# Patient Record
Sex: Female | Born: 1970 | Race: White | Hispanic: No | Smoking: Never smoker
Health system: Southern US, Community
[De-identification: ages and names within clinical notes are randomized; demographics above are authoritative.]

## PROBLEM LIST (undated history)

## (undated) DIAGNOSIS — K259 Gastric ulcer, unspecified as acute or chronic, without hemorrhage or perforation: Secondary | ICD-10-CM

## (undated) DIAGNOSIS — J852 Abscess of lung without pneumonia: Secondary | ICD-10-CM

## (undated) DIAGNOSIS — Z9189 Other specified personal risk factors, not elsewhere classified: Secondary | ICD-10-CM

## (undated) DIAGNOSIS — D509 Iron deficiency anemia, unspecified: Secondary | ICD-10-CM

## (undated) DIAGNOSIS — K912 Postsurgical malabsorption, not elsewhere classified: Secondary | ICD-10-CM

## (undated) DIAGNOSIS — J851 Abscess of lung with pneumonia: Secondary | ICD-10-CM

## (undated) DIAGNOSIS — F32A Depression, unspecified: Secondary | ICD-10-CM

## (undated) DIAGNOSIS — D696 Thrombocytopenia, unspecified: Secondary | ICD-10-CM

## (undated) DIAGNOSIS — J189 Pneumonia, unspecified organism: Secondary | ICD-10-CM

## (undated) DIAGNOSIS — F419 Anxiety disorder, unspecified: Secondary | ICD-10-CM

## (undated) DIAGNOSIS — F329 Major depressive disorder, single episode, unspecified: Secondary | ICD-10-CM

## (undated) DIAGNOSIS — K562 Volvulus: Secondary | ICD-10-CM

## (undated) DIAGNOSIS — E46 Unspecified protein-calorie malnutrition: Secondary | ICD-10-CM

## (undated) DIAGNOSIS — Z789 Other specified health status: Secondary | ICD-10-CM

## (undated) DIAGNOSIS — D6949 Other primary thrombocytopenia: Secondary | ICD-10-CM

## (undated) DIAGNOSIS — K287 Chronic gastrojejunal ulcer without hemorrhage or perforation: Secondary | ICD-10-CM

## (undated) HISTORY — DX: Depression, unspecified: F32.A

## (undated) HISTORY — PX: CHOLECYSTECTOMY: SHX55

## (undated) HISTORY — DX: Other specified health status: Z78.9

## (undated) HISTORY — PX: COLON SURGERY: SHX602

## (undated) HISTORY — DX: Abscess of lung with pneumonia: J85.1

## (undated) HISTORY — PX: OTHER SURGICAL HISTORY: SHX169

## (undated) HISTORY — DX: Major depressive disorder, single episode, unspecified: F32.9

## (undated) HISTORY — PX: APPENDECTOMY: SHX54

## (undated) HISTORY — DX: Other specified personal risk factors, not elsewhere classified: Z91.89

## (undated) HISTORY — DX: Anxiety disorder, unspecified: F41.9

---

## 2009-12-13 HISTORY — PX: GASTRIC BYPASS: SHX52

## 2010-02-25 DIAGNOSIS — K760 Fatty (change of) liver, not elsewhere classified: Secondary | ICD-10-CM | POA: Insufficient documentation

## 2010-02-25 DIAGNOSIS — E669 Obesity, unspecified: Secondary | ICD-10-CM | POA: Insufficient documentation

## 2012-06-21 DIAGNOSIS — Z9884 Bariatric surgery status: Secondary | ICD-10-CM | POA: Insufficient documentation

## 2013-04-02 DIAGNOSIS — K56609 Unspecified intestinal obstruction, unspecified as to partial versus complete obstruction: Secondary | ICD-10-CM | POA: Insufficient documentation

## 2013-04-13 DIAGNOSIS — K562 Volvulus: Secondary | ICD-10-CM

## 2013-04-13 HISTORY — DX: Volvulus: K56.2

## 2013-07-06 DIAGNOSIS — T8189XA Other complications of procedures, not elsewhere classified, initial encounter: Secondary | ICD-10-CM | POA: Insufficient documentation

## 2014-03-07 DIAGNOSIS — F411 Generalized anxiety disorder: Secondary | ICD-10-CM | POA: Insufficient documentation

## 2014-03-07 DIAGNOSIS — F410 Panic disorder [episodic paroxysmal anxiety] without agoraphobia: Secondary | ICD-10-CM | POA: Insufficient documentation

## 2014-07-31 DIAGNOSIS — K279 Peptic ulcer, site unspecified, unspecified as acute or chronic, without hemorrhage or perforation: Secondary | ICD-10-CM | POA: Insufficient documentation

## 2014-07-31 DIAGNOSIS — Z9889 Other specified postprocedural states: Secondary | ICD-10-CM | POA: Insufficient documentation

## 2014-08-01 DIAGNOSIS — K562 Volvulus: Secondary | ICD-10-CM | POA: Insufficient documentation

## 2014-08-01 HISTORY — DX: Volvulus: K56.2

## 2014-08-06 ENCOUNTER — Ambulatory Visit: Payer: Self-pay | Admitting: Internal Medicine

## 2014-08-06 LAB — CBC CANCER CENTER
BASOS PCT: 0.9 %
Basophil #: 0.1 x10 3/mm (ref 0.0–0.1)
EOS PCT: 2 %
EOS PCT: 2 %
Eosinophil #: 0.1 x10 3/mm (ref 0.0–0.7)
HCT: 31 % — ABNORMAL LOW (ref 35.0–47.0)
HGB: 9.8 g/dL — AB (ref 12.0–16.0)
LYMPHS PCT: 23.5 %
Lymphocyte #: 1.5 x10 3/mm (ref 1.0–3.6)
Lymphocytes: 23 %
MCH: 26 pg (ref 26.0–34.0)
MCHC: 31.8 g/dL — ABNORMAL LOW (ref 32.0–36.0)
MCV: 82 fL (ref 80–100)
Monocyte #: 0.3 x10 3/mm (ref 0.2–0.9)
Monocyte %: 5 %
Monocytes: 5 %
Neutrophil #: 4.3 x10 3/mm (ref 1.4–6.5)
Neutrophil %: 68.6 %
Platelet: 403 x10 3/mm (ref 150–440)
RBC: 3.78 10*6/uL — ABNORMAL LOW (ref 3.80–5.20)
RDW: 15 % — AB (ref 11.5–14.5)
SEGMENTED NEUTROPHILS: 70 %
WBC: 6.3 x10 3/mm (ref 3.6–11.0)

## 2014-08-06 LAB — RETICULOCYTES
Absolute Retic Count: 0.0362 10*6/uL (ref 0.019–0.186)
Reticulocyte: 0.96 % (ref 0.4–3.1)

## 2014-08-06 LAB — FOLATE: FOLIC ACID: 19.1 ng/mL (ref 3.1–100.0)

## 2014-08-13 ENCOUNTER — Ambulatory Visit: Payer: Self-pay | Admitting: Internal Medicine

## 2014-09-12 ENCOUNTER — Ambulatory Visit: Payer: Self-pay | Admitting: Internal Medicine

## 2014-09-16 LAB — CANCER CENTER HEMOGLOBIN: HGB: 12 g/dL (ref 12.0–16.0)

## 2014-10-02 DIAGNOSIS — D6949 Other primary thrombocytopenia: Secondary | ICD-10-CM | POA: Insufficient documentation

## 2014-10-02 HISTORY — DX: Other primary thrombocytopenia: D69.49

## 2014-10-13 ENCOUNTER — Ambulatory Visit: Payer: Self-pay | Admitting: Internal Medicine

## 2014-10-22 DIAGNOSIS — R110 Nausea: Secondary | ICD-10-CM | POA: Insufficient documentation

## 2014-10-22 DIAGNOSIS — K287 Chronic gastrojejunal ulcer without hemorrhage or perforation: Secondary | ICD-10-CM | POA: Insufficient documentation

## 2014-10-22 DIAGNOSIS — K912 Postsurgical malabsorption, not elsewhere classified: Secondary | ICD-10-CM | POA: Insufficient documentation

## 2014-10-22 DIAGNOSIS — D649 Anemia, unspecified: Secondary | ICD-10-CM | POA: Insufficient documentation

## 2014-10-22 DIAGNOSIS — Z709 Sex counseling, unspecified: Secondary | ICD-10-CM | POA: Insufficient documentation

## 2014-10-22 HISTORY — DX: Postsurgical malabsorption, not elsewhere classified: K91.2

## 2014-10-22 HISTORY — DX: Chronic gastrojejunal ulcer without hemorrhage or perforation: K28.7

## 2014-10-25 LAB — CBC CANCER CENTER
Basophil #: 0 x10 3/mm (ref 0.0–0.1)
Basophil %: 0.7 %
EOS ABS: 0.1 x10 3/mm (ref 0.0–0.7)
Eosinophil %: 1.8 %
HCT: 36.8 % (ref 35.0–47.0)
HGB: 12.1 g/dL (ref 12.0–16.0)
LYMPHS ABS: 2.2 x10 3/mm (ref 1.0–3.6)
LYMPHS PCT: 35.1 %
MCH: 28 pg (ref 26.0–34.0)
MCHC: 32.9 g/dL (ref 32.0–36.0)
MCV: 85 fL (ref 80–100)
MONO ABS: 0.2 x10 3/mm (ref 0.2–0.9)
Monocyte %: 3.8 %
NEUTROS ABS: 3.7 x10 3/mm (ref 1.4–6.5)
Neutrophil %: 58.6 %
PLATELETS: 363 x10 3/mm (ref 150–440)
RBC: 4.32 10*6/uL (ref 3.80–5.20)
RDW: 16.5 % — ABNORMAL HIGH (ref 11.5–14.5)
WBC: 6.3 x10 3/mm (ref 3.6–11.0)

## 2014-10-25 LAB — IRON AND TIBC
Iron Bind.Cap.(Total): 256 ug/dL (ref 250–450)
Iron Saturation: 15 %
Iron: 39 ug/dL — ABNORMAL LOW (ref 50–170)
Unbound Iron-Bind.Cap.: 217 ug/dL

## 2014-10-25 LAB — FERRITIN: Ferritin (ARMC): 93 ng/mL (ref 8–388)

## 2014-11-12 ENCOUNTER — Ambulatory Visit: Payer: Self-pay | Admitting: Internal Medicine

## 2014-12-02 ENCOUNTER — Emergency Department: Payer: Self-pay | Admitting: Emergency Medicine

## 2014-12-02 LAB — CBC
HCT: 40.7 % (ref 35.0–47.0)
HGB: 13.3 g/dL (ref 12.0–16.0)
MCH: 28.9 pg (ref 26.0–34.0)
MCHC: 32.6 g/dL (ref 32.0–36.0)
MCV: 89 fL (ref 80–100)
PLATELETS: 26 10*3/uL — AB (ref 150–440)
RBC: 4.6 10*6/uL (ref 3.80–5.20)
RDW: 14.4 % (ref 11.5–14.5)
WBC: 5.3 10*3/uL (ref 3.6–11.0)

## 2014-12-02 LAB — URINALYSIS, COMPLETE
BACTERIA: NONE SEEN
BILIRUBIN, UR: NEGATIVE
Blood: NEGATIVE
Glucose,UR: NEGATIVE mg/dL (ref 0–75)
Hyaline Cast: 25
Leukocyte Esterase: NEGATIVE
NITRITE: NEGATIVE
PROTEIN: NEGATIVE
Ph: 5 (ref 4.5–8.0)
RBC,UR: 3 /HPF (ref 0–5)
Specific Gravity: 1.019 (ref 1.003–1.030)
WBC UR: 2 /HPF (ref 0–5)

## 2014-12-02 LAB — DRUG SCREEN, URINE
AMPHETAMINES, UR SCREEN: NEGATIVE (ref ?–1000)
BENZODIAZEPINE, UR SCRN: POSITIVE (ref ?–200)
Barbiturates, Ur Screen: NEGATIVE (ref ?–200)
CANNABINOID 50 NG, UR ~~LOC~~: NEGATIVE (ref ?–50)
Cocaine Metabolite,Ur ~~LOC~~: NEGATIVE (ref ?–300)
MDMA (Ecstasy)Ur Screen: POSITIVE (ref ?–500)
METHADONE, UR SCREEN: NEGATIVE (ref ?–300)
OPIATE, UR SCREEN: NEGATIVE (ref ?–300)
Phencyclidine (PCP) Ur S: NEGATIVE (ref ?–25)
Tricyclic, Ur Screen: NEGATIVE (ref ?–1000)

## 2014-12-02 LAB — COMPREHENSIVE METABOLIC PANEL
ALBUMIN: 2.8 g/dL — AB (ref 3.4–5.0)
AST: 22 U/L (ref 15–37)
Alkaline Phosphatase: 95 U/L
Anion Gap: 10 (ref 7–16)
BUN: 7 mg/dL (ref 7–18)
Bilirubin,Total: 0.3 mg/dL (ref 0.2–1.0)
Calcium, Total: 8.2 mg/dL — ABNORMAL LOW (ref 8.5–10.1)
Chloride: 99 mmol/L (ref 98–107)
Co2: 25 mmol/L (ref 21–32)
Creatinine: 0.9 mg/dL (ref 0.60–1.30)
Glucose: 141 mg/dL — ABNORMAL HIGH (ref 65–99)
Osmolality: 269 (ref 275–301)
Potassium: 3.8 mmol/L (ref 3.5–5.1)
SGPT (ALT): 22 U/L
Sodium: 134 mmol/L — ABNORMAL LOW (ref 136–145)
Total Protein: 6 g/dL — ABNORMAL LOW (ref 6.4–8.2)

## 2014-12-02 LAB — DIFFERENTIAL
Basophil #: 0 10*3/uL (ref 0.0–0.1)
Basophil %: 0.7 %
EOS ABS: 0 10*3/uL (ref 0.0–0.7)
EOS PCT: 0.2 %
LYMPHS PCT: 11.4 %
Lymphocyte #: 0.6 10*3/uL — ABNORMAL LOW (ref 1.0–3.6)
MONO ABS: 0.2 x10 3/mm (ref 0.2–0.9)
MONOS PCT: 4.2 %
Neutrophil #: 4.4 10*3/uL (ref 1.4–6.5)
Neutrophil %: 83.5 %

## 2014-12-02 LAB — ACETAMINOPHEN LEVEL: ACETAMINOPHEN: 3 ug/mL — AB

## 2014-12-02 LAB — SALICYLATE LEVEL

## 2014-12-02 LAB — ETHANOL

## 2014-12-03 ENCOUNTER — Inpatient Hospital Stay: Payer: Self-pay | Admitting: Psychiatry

## 2014-12-03 LAB — COMPREHENSIVE METABOLIC PANEL
ANION GAP: 11 (ref 7–16)
Albumin: 2.7 g/dL — ABNORMAL LOW (ref 3.4–5.0)
Alkaline Phosphatase: 91 U/L
BILIRUBIN TOTAL: 0.2 mg/dL (ref 0.2–1.0)
BUN: 2 mg/dL — ABNORMAL LOW (ref 7–18)
CO2: 24 mmol/L (ref 21–32)
Calcium, Total: 7.9 mg/dL — ABNORMAL LOW (ref 8.5–10.1)
Chloride: 105 mmol/L (ref 98–107)
Creatinine: 0.74 mg/dL (ref 0.60–1.30)
EGFR (African American): >60
EGFR (Non-African Amer.): >60
GLUCOSE: 85 mg/dL (ref 65–99)
OSMOLALITY: 275 (ref 275–301)
Potassium: 3.7 mmol/L (ref 3.5–5.1)
SGOT(AST): 29 U/L (ref 15–37)
SGPT (ALT): 22 U/L
Sodium: 140 mmol/L (ref 136–145)
TOTAL PROTEIN: 5.9 g/dL — AB (ref 6.4–8.2)

## 2014-12-03 LAB — CBC WITH DIFFERENTIAL/PLATELET
BASOS ABS: 0 10*3/uL (ref 0.0–0.1)
BASOS PCT: 0.6 %
EOS ABS: 0 10*3/uL (ref 0.0–0.7)
Eosinophil %: 0.2 %
HCT: 40.7 % (ref 35.0–47.0)
HGB: 13.2 g/dL (ref 12.0–16.0)
LYMPHS ABS: 1.5 10*3/uL (ref 1.0–3.6)
Lymphocyte %: 30.8 %
MCH: 28.9 pg (ref 26.0–34.0)
MCHC: 32.4 g/dL (ref 32.0–36.0)
MCV: 89 fL (ref 80–100)
MONO ABS: 0.2 x10 3/mm (ref 0.2–0.9)
Monocyte %: 4.1 %
NEUTROS ABS: 3.1 10*3/uL (ref 1.4–6.5)
Neutrophil %: 64.3 %
Platelet: 49 10*3/uL — ABNORMAL LOW (ref 150–440)
RBC: 4.57 10*6/uL (ref 3.80–5.20)
RDW: 14.2 % (ref 11.5–14.5)
WBC: 4.9 10*3/uL (ref 3.6–11.0)

## 2014-12-03 LAB — DRUG SCREEN, URINE
Amphetamines, Ur Screen: NEGATIVE (ref ?–1000)
Barbiturates, Ur Screen: NEGATIVE (ref ?–200)
Benzodiazepine, Ur Scrn: NEGATIVE (ref ?–200)
Cannabinoid 50 Ng, Ur ~~LOC~~: NEGATIVE (ref ?–50)
Cocaine Metabolite,Ur ~~LOC~~: NEGATIVE (ref ?–300)
MDMA (ECSTASY) UR SCREEN: NEGATIVE (ref ?–500)
METHADONE, UR SCREEN: NEGATIVE (ref ?–300)
Opiate, Ur Screen: NEGATIVE (ref ?–300)
PHENCYCLIDINE (PCP) UR S: NEGATIVE (ref ?–25)
TRICYCLIC, UR SCREEN: NEGATIVE (ref ?–1000)

## 2014-12-04 LAB — PLATELET COUNT: PLATELETS: 78 10*3/uL — AB (ref 150–440)

## 2015-03-03 ENCOUNTER — Ambulatory Visit
Admit: 2015-03-03 | Disposition: A | Discharge status: Home / Self Care | Payer: Self-pay | Attending: Internal Medicine | Admitting: Internal Medicine

## 2015-03-14 ENCOUNTER — Ambulatory Visit
Admit: 2015-03-14 | Disposition: A | Discharge status: Home / Self Care | Payer: Self-pay | Attending: Internal Medicine | Admitting: Internal Medicine

## 2015-04-05 NOTE — Consult Note (Signed)
PATIENT NAME:  Summer Howard, AUSBURN MR#:  [id number] DATE OF BIRTH:  Apr 23, 1971  DATE OF CONSULTATION:  12/03/2014  REFERRING PHYSICIAN:  Audery Amel, MD CONSULTING PHYSICIAN:  Zedekiah Hinderman A. Allena Katz, MD  PRIMARY CARE PHYSICIAN: None.   REASON FOR CONSULTATION:  Thrombocytopenia.   HISTORY OF PRESENT ILLNESS: Summer Howard is a pleasant 44 year old Caucasian female with history of gastric ulcer, anxiety and depression who was brought in to the Emergency Room after she was found to act strangely at home, according to her children. The patient's husband passed away in summer of 09-May-2014  after a sudden heart attack and since then The patient has been going downhill, feeling very depressed and anxious. She has started drinking alcohol on and off for the last several months. She drank a bottle of wine yesterday since she was not able to sleep and was not able to rest secondary to her severe anxiety. She was brought to the Emergency Room. She denies that she drinks regularly. She also has been taking some supplements to help her relax which is "kava " over the counter. She also has been combining it with Xanax, alcohol and "kava" for the last few days. Her Xanax was her old prescription that was filled several months ago. It was noted in the Emergency Room that on the 21st of December, her platelet count was around 26,000. Today, platelet count was 49,000. The patient denies any bleeding disorder or any known history of platelet disorder. Her last platelet count from October 2015 was more than 300,000. The patient denies any other substance abuse.   PAST PSYCHIATRIC HISTORY:  No psychiatric hospitalizations. Denies any suicidal attempts. She denies any history of psychotic symptoms.   SOCIAL HISTORY: She is widowed. She has children 10, 12 and 15. Her husband died suddenly of MI in the summer of 05/09/14. She is not working. She moved in the [place] area, close to her father. She   PAST MEDICAL HISTORY:   1. Gastric bypass.   2. Gastric ulcer.  3. 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  4. Iron-deficiency anemia.   MEDICATIONS:  Currently she states she takes: 1.  Protonix 1 tablet 40 mg b.i.d.  2. Sucralfate 10 mL 4 times a day.  3. Zofran 4 mg every 8 hours.  4. Nystatin 5 mL 4 times a day as needed. 5. Misoprostol 100 mcg 1 tablet b.i.d.   ALLERGIES: PHENOBARBITAL AND SULFA.   FAMILY HISTORY:  Positive for heart disease.    REVIEW OF SYSTEMS.  CONSTITUTIONAL: No fever, fatigue, weakness.  EYES: No blurred or double vision, glaucoma, or cataracts.  EARS, NOSE, THROAT: No tinnitus, ear pain, hearing loss.  RESPIRATORY: No cough, wheeze, hemoptysis, or dyspnea.  CARDIOVASCULAR: No chest pain, orthopnea, edema, no hypertension.  GASTROINTESTINAL: Positive for gastric ulcer and GERD. No hematemesis or melena.  GENITOURINARY: No dysuria, hematuria, or frequency.  ENDOCRINE: No polyuria, nocturia or thyroid problems.  HEMATOLOGY: No anemia or easy bruising or bleeding.  SKIN: No acne, rash, or lesion.  MUSCULOSKELETAL: No arthritis, cramps or swelling or gout.  NEUROLOGIC: No CVA, TIA or seizures. All other systems reviewed and negative.   PHYSICAL EXAMINATION:  GENERAL: The patient is awake, alert, oriented x3, not in acute distress.  VITAL SIGNS: She is afebrile. Pulse is 86. Blood pressure is 118/76.  HEENT: Atraumatic, normocephalic. Pupils are equal, round and reactive to light and accommodation. EOM intact. Oral mucosa is moist.  NECK: Supple. No JVD. No carotid bruit.  LUNGS:  Clear to auscultation bilaterally. No rales, rhonchi, respiratory distress or labored breathing.  HEART: Both the heart sounds are normal. Rate, rhythm regular. PMI not lateralized. Chest is nontender.  EXTREMITIES: Good pedal pulses, good femoral pulses. No lower extremity edema.  ABDOMEN: Soft, benign, nontender. No organomegaly. Positive bowel sounds.  NEUROLOGIC: Grossly intact cranial nerves 2 through 12. No  motor or sensory deficit.  PSYCHIATRIC: The patient is significantly anxious. Positive for depression.   Her platelet count on the 21st of December was 26,000, today, the 22nd of December is 49,000. Electrolytes are within normal limits. Albumin is 2.7. Urine drug screen is negative. Hemoglobin and hematocrit is 13.4 and 40.7, white count is 4900. Urine pregnancy test is negative.   ASSESSMENT: A 44 year old, Summer Howard, with history of peptic ulcer disease, history of palpitations, anxiety, depression, is being admitted under behavioral medicine for anxiety, depression, and alcohol abuse, and Internal medicine was consulted for:  1. Thrombocytopenia which appears acute. The patient had normal platelet count in October and November 2015. This seems likely due to her excessive use of alcohol lately, which the patient does admit to due to her severe depression, anxiety, since she is trying to cope with her 3 sons and her husband's death which was recently in summer of 2015 and her recent move to the area close to her parents. The patient denies any bleeding any acute bleeding. She is advised to remain abstained from alcohol given her acute thrombocytopenia. For now, her counts have improved from 26,000 to 49,000. We will continue to monitor it. The patient can follow up with her primary care physician or she can [facility] Sherrlyn HockPandit, who monitored her hemoglobin and hematocrit iron-deficiency anemia. At this point, no other recommendations other than abstinence from alcohol which the patient did voice understanding.  2. Anxiety with major depression. Per Dr. Toni Amendlapacs.  3. Thank you for the consult. We will follow the patient peripherally. Call us if needed. I will add vitamin B12 and thiamine with folic acid to patient's medication list. 4. Gastroesophageal reflux disease. Continue proton pump inhibitor and Carafate as you are.    TIME SPENT: 40 minutes.     ____________________________ Wylie HailSona A. Allena KatzPatel, MD sap:kl D: 12/03/2014 20:15:27 ET T: 12/03/2014 21:05:02 ET JOB#: [id number]  cc: Ilaria Much A. Allena KatzPatel, MD, <Dictator> Willow OraSONA A Siya Flurry MD ELECTRONICALLY SIGNED 12/10/2014 18:38

## 2015-04-05 NOTE — Consult Note (Signed)
PATIENT NAME:  Summer Howard, Summer Howard MR#:  [id number] DATE OF BIRTH:  [birth year]  DATE OF CONSULTATION:  12/03/2014  REFERRING PHYSICIAN:   CONSULTING PHYSICIAN:  Audery Amel, MD  IDENTIFYING INFORMATION AND REASON FOR CONSULTATION: A 44 year old woman with a history of recent major stress and symptoms of depression who presents voluntarily to the hospital.   CHIEF COMPLAINT: "I should have never left."   HISTORY OF PRESENT ILLNESS: Information obtained from the patient and the chart. The patient was here in the hospital yesterday at the commitment of her father because of her drinking and an acute delirium. Once the delirium cleared up and she was sober, she did not yesterday meet commitment criteria and was released home. The patient reports today that she feels like she has better insight. She realizes that her mood is sad all the time. She is overwhelmed by stress. She is not functioning well at home as mother for her children. She has been eating very poorly and her weight is going down. Sleep is poor. Energy level is poor. Concentration is poor. She denies any acute suicidal intent or plan, but yesterday took an overdose combination of alcohol, Kava, and Xanax, resulting in a fall at home. Not reporting any psychotic symptoms. She is not currently getting any mental health treatment except for seeing a therapist regularly. Home physical, medical, and mental health functioning getting significantly worse.   PAST PSYCHIATRIC HISTORY: No history of psychiatric hospitalization. She has been treated with antidepressants in the past, she particularly remembers Remeron, is unsure whether it was helpful in the past. Denies past history of suicide attempts.   FAMILY HISTORY:  None known.   SOCIAL HISTORY: The patient's husband died suddenly this past summer, leaving her a widow with 3 adolescent age children. She relocated to [facility] to be close to her father. Has not been able to go back to work and  continues to feel overwhelmed and stressed out all the time.   PAST MEDICAL HISTORY: The patient had a gastric bypass surgery in the not-to-distant past and then had some complications from it. Had to have a bowel resection for a small bowel obstruction. Developed an ulcer. Since that time she has remained anorexic and complained about pain in her abdomen, which she attributes to her ulcer, but as a result she is losing more weight and medically looks worse.   CURRENT MEDICATIONS: Pantoprazole 40 mg b.i.d, Carafate 1 gram 4 times a day, nystatin swish swallow 5 times a day, Cytotec 100 mcg twice a day.   ALLERGIES: PHENOBARBITAL AND SULFA DRUGS.   REVIEW OF SYSTEMS: Depressed mood. Fatigue. Crying. Drinking excessively. Abdominal pain. No appetite. Denies hallucinations.   ALCOHOL HISTORY: The patient has recently taken to drinking more. Yesterday, she drank apparently quite a bit on top of her p.r.n. Xanax. She admits that today she drank about half a bottle wine when she got up in the morning. Drinking sounds like it is getting more out of control. She has never had treatment for it in the past. Denies any other substance abuse problems.   MENTAL STATUS EXAMINATION: Very cachectic, chronically ill-looking but neatly groomed woman. Cooperative but fatigued. Eye contact is good. Psychomotor activity slow and sluggish. Speech normal tone, and decreased volume. Affect is blunted, not tearful, but dysphoric. Mood stated as being bad. Thoughts are lucid but slow. No loosening of associations. No delusions. Denies auditory or visual hallucinations. Denies current suicidal or homicidal desire. She is alert and oriented x4. Can  remember 3/3 objects immediately and at 3 minutes. Judgment and insight improved. Intelligence normal.   LABORATORY RESULTS: Nothing new has been checked today except for a pregnancy test. Yesterday she presented with the most remarkable findings being a platelet count of 26,000. She  has had thrombocytopenia in the past and was supposed to follow up today with her hematologist, but will need to have that checked on again. Her drug screen yesterday was positive for MDMA and benzodiazepines. We have not rechecked it again today.   ASSESSMENT: A 44 year old woman who appears to have significant depression, combined with alcohol abuse, possibly with anorexia. Poor functioning. Has had falls at home. No suicidal ideation, but not getting any outpatient treatment. Family is very concerned for her safety.   TREATMENT PLAN: Admit to psychiatry. Fall, seizure and suicide and close precautions in place. Start Remeron 15 mg at night and p.r.n. trazodone. Recheck labs. Medicine consult.   DIAGNOSIS, PRINCIPAL AND PRIMARY:  AXIS I: Major depression, single, severe.   SECONDARY DIAGNOSES:  AXIS I: To rule out eating disorder, not otherwise specified. AXIS II: Deferred.  AXIS III:  Low weight. History of ulcers, status post bypass, and thrombocytopenia.  AXIS V: Functioning at time of admission 30.    ____________________________ Audery AmelJohn T. Clapacs, MD jtc:dw D: 12/03/2014 17:35:59 ET T: 12/03/2014 17:54:20 ET JOB#: [id number]  cc: Audery AmelJohn T. Clapacs, MD, <Dictator> Audery AmelJOHN T CLAPACS MD ELECTRONICALLY SIGNED 12/04/2014 0:42

## 2015-04-05 NOTE — Consult Note (Signed)
Psychiatry: Follow-up note for this 44 year old woman who presented to the emergency room today with altered mental status.  With the patient's consent, I spoke with her father.  He provided more history which raises legitimate concerns about the possibility of eating disordered behavior and underlined the concern that the family including the patient's children have for her safety and functionality.  In light of this patient was reevaluated. has no new complaints.  She is requesting to go home.  She states that she understands that it was a mistake to combine alcohol and medication today and promises she will not do it again.  On review of systems she denies suicidality, denies any psychotic symptoms, reports that her mood is just feeling tired.  She indicates that her plan is to go home and get some rest because it is important for her to function better and take care of her family. light of new information I offered to admit the patient voluntarily to the hospital for observation.  Rationale was explained to the patient.  She declined and does not want to be admitted to the hospital.  At this point she has already been taken off involuntary commitment and she also no longer meets criteria for a reinitiation of commitment papers. with the patient's consent I met with the father and explain the situation.  Emphasized that I think we have an understanding of what was going on.  Encouraged him to bring the patient back to the hospital if concerns return.  Furthermore I have recommended to the patient in clear terms that she discontinue the use of alcohol, discontinue the use of, kava and that she strongly consider seeing a psychiatrist for further psychiatric evaluation.  Patient agrees with all of these recommendations.  No further treatment at this time.  Electronic Signatures: Gonzella Lex (MD)  (Signed on 21-Dec-15 20:27)  Authored  Last Updated: 21-Dec-15 20:27 by Gonzella Lex (MD)

## 2015-04-05 NOTE — Consult Note (Signed)
PATIENT NAME:  Summer Howard, BOAN MR#:  [id number] DATE OF BIRTH:  [birth year]  DATE OF CONSULTATION:  12/02/2014  CONSULTING PHYSICIAN:  Audery Amel, MD  IDENTIFYING INFORMATION AND REASON FOR CONSULTATION: A 44 year old woman with a history of some grieving, but no clearcut mental health problems, who was brought here and petitioned by her family because of confusion this afternoon.   CHIEF COMPLAINT: "I just had a little bit to drink."   HISTORY OF PRESENT ILLNESS: Information obtained from the patient and the chart. Commitment paperwork and testimony from the father is that the patient became very confused and was acting strangely today, which concerned her children. They called their grandfather who came over and found the patient passed out at the bottom of a set of stairs, incoherent. He had her brought into the hospital. The patient states that she drank 3 of the 6 ounce sized bottles of wine today.  With prompting. she eventually admits that she also took 2 of 0.25 mg Xanax and had been taking kava-kava today. She states that today was just a bad day for her mood. Christmas is coming and her husband died this summer. She has multiple stressors on her. She said she just wanted to sleep and get away from her problems briefly. Completely denies that there was any suicidality involved. States that most of the time her mood feels okay. Does not describe herself as feeling depressed most of the time. Sleeps reasonably well. Denies any psychotic symptoms. Denies suicidal or homicidal ideation. She is not currently taking any psychiatric medicine. The Xanax was last filled several months ago. She does say that she sees a Veterinary surgeon for mental health care dealing with her grief. Denies that she drinks regularly. Denies any other substance abuse.   PAST PSYCHIATRIC HISTORY: No history of psychiatric hospitalizations. No history of suicide attempts. She has been prescribed medicine for "nerves" including Xanax  in the past and think she may have taken an antidepressant at some point, but is not currently doing so and is not clear as to whether anything ever helped. She denies any history of suicidality. Denies any history of psychotic symptoms.   SOCIAL HISTORY: Three children, ages 29, 72, and 90. Her husband died suddenly of what sounds like an acute MI this summer. The patient and her 3 children then relocated to the [facility] area to be closer to her father. She is not working. Her 3 children have all had medical problems to one degree or another. The patient herself has chronic medical problems. All of this amounts to quite a bit of stress.   PAST MEDICAL HISTORY: The patient had a gastric bypass and says that she also then had to have a bowel resection for a small bowel obstruction. Later, she developed an ulcer, then developed iron deficiency anemia. She has lost a great deal of weight, although she says that her weight has been stable over the last few months. She has had problems with thrombocytopenia in the past, but had thought that it was taken care of. Currently she takes Protonix, Carafate, and vitamin B12 and also [facility]   FAMILY HISTORY: No known family history of mental illness.   SUBSTANCE ABUSE HISTORY: Says that she drinks only infrequently. Denies that she has ever abused any other drugs.   REVIEW OF SYSTEMS: Mood is feeling mildly down, but not severely depressed. Denies auditory or visual hallucinations. Denies suicidal or homicidal ideation. Not having  any acute pain. No other specific physical complaints right now.   MENTAL STATUS EXAMINATION: This is a somewhat cachectic, chronically ill-appearing woman, who looks her stated age. Cooperative with the interview. Eye contact good. Psychomotor activity normal. Speech is normal rate, tone, and volume. Affect mildly anxious, but reactive. Mood stated as being fine. Thoughts are lucid  without loosening of associations or delusions. Denies auditory or visual hallucinations. Denies any suicidal or homicidal ideation. She is alert and oriented x 4. She can remember 3/3 objects immediately and 2/3 at 3 minutes. Judgment and insight appear to be adequate. Intelligence normal.   VITAL SIGNS: Most recent in the Emergency Room, blood pressure 124/71, respirations 20, pulse 94, temperature 97.9.   LABORATORY RESULTS: Drug screen is positive for MDMA and benzodiazepines. Alcohol level negative. Chemistry panel: Low calcium 8.2. Low total protein at 6, low albumin 2.8, glucose 141. CBC, platelet count of 26,000 is quite remarkable, otherwise normal. Urinalysis borderline. Acetaminophen and salicylates negative.   ASSESSMENT: A 44 year old woman with a history of multiple major stresses who was confused and delirious, but has now resolved. Seems to almost certainly be the result of the alcohol combined with the Xanax and the kava, none of which should be taken together. No evidence, however, of any suicidal intent. Not describing herself as having major depression. No evidence of psychosis or dementia.   TREATMENT PLAN: The patient does not meet commitment criteria. Discontinue the IVC. She will be discharged home. Will follow up with her outpatient counselor. No medication needed. She was given very clear education about the dangerous interaction of kava, Xanax, and alcohol and it was recommended that she discontinue the kava entirely and never use any of them together. The patient expressed understanding.   DIAGNOSIS, PRINCIPAL AND PRIMARY:  AXIS I: Delirium due to substance intoxication, now resolved.   SECONDARY DIAGNOSES: AXIS I: Adjustment disorder with depressed mood.  AXIS II: Deferred.  AXIS III: Cachexia, chronic bowel problems, thrombocytopenia.   ____________________________ Audery AmelJohn T. Clapacs, MD jtc:LT D: 12/02/2014 16:50:35 ET T: 12/02/2014 17:43:47  ET JOB#: [id number]  cc: Audery AmelJohn T. Clapacs, MD, <Dictator> Audery AmelJOHN T CLAPACS MD ELECTRONICALLY SIGNED 12/04/2014 0:40

## 2015-04-09 NOTE — H&P (Signed)
Howard NAME:  Summer Howard, Summer Howard MR#:  [id number] DATE OF BIRTH:  12-06-1971  DATE OF ADMISSION:  12/03/2014  DATE OF SERVICE:  12/04/2014.   REFERRING PHYSICIAN: Emergency Room M.D.   ATTENDING PHYSICIAN: Kristine Linea, M.D.   IDENTIFYING DATA: Summer Howard is a 44 year old female with no past psychiatric history.   CHIEF COMPLAINT: "I had too much to drink."   HISTORY OF PRESENT ILLNESS:  Summer Howard has no psychiatric history but in June 2015, she lost her husband, he had a heart attack. She did see a therapist through hospice a couple of times but never really took time to grieve. She has not been seeing a psychiatrist and took no medications. 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 She reports some symptoms of depression mostly insomnia, and chronic abdominal pain from an ulcer. She did started drinking  more in order to control her pain and in order to go to sleep. She does not drink daily, more of an episodic drinker, she never gets drunk; however, on Summer day of admission her behavior was strange.  It alarmed her children that she was confused and not in control of her behavior, they called Summer grandfather who brought Summer Howard to [facility] hospital. In fact, she was in [facility] hospital twice on Summer 21st and 22nd.  Because of her unusual behavior there was thinking that maybe she overdosed on something, but Summer Howard adamantly denies and admits to drinking a bottle of wine on an empty stomach. Her stepmother and her father were very concerned and, in talking to an intake person they reported that Summer Howard made some vague statements that she does not want to live anymore that she is overwhelmed with Summer first holidays without her husband and they assumed that she was unsafe.  Summer Howard adamantly denies any  thoughts of hurting herself or others, no intention or suicide plan. She thinks that she is a good mother and wants to do her best to protect and raise her children. She hopes to be home for Summer holidays. She does admit to feeling depressed at times, and never really dealt with Summer death of her husband. Chronic pain with poor quality of life is another contributing factor. Most days she is in bed with a heating pad her stomach. She does go to [facility] for treatments, they do not offer any pain medication. In Summer chart it says that Summer Howard is being prescribed narcotic pain killers and Xanax by her primary provider. Summer Howard tells me that this is not true. No pain medication is prescribed. She had some Xanax prescribed shortly after her husband passed away, but has not been using it lately.  She was unaware that mixing of benzodiazepines and alcohol is dangerous.  On Summer 21st during her first Emergency Room visit, she was positive for benzodiazepines. On Summer day of admission, she was negative for both benzodiazepines and narcotic pain killers. She reports poor sleep,  poor appetite and there are days that she does not eat at all, anhedonia, feeling of guilt, hopelessness, worthlessness, poor energy and concentration, social isolation, and crying spells. She denies thoughts of hurting herself. She denies daily, excessive alcohol drinking, prescription drug misuse or illicit substance use.   PAST PSYCHIATRIC HISTORY: Never hospitalized.  No suicide attempts.   FAMILY PSYCHIATRIC HISTORY: None reported.   PAST MEDICAL HISTORY: Status post gastric bypass and another abdominal surgery, chronic anemia.  ALLERGIES:  PHENOBARBITAL, SULFA DRUGS.   MEDICATIONS ON ADMISSION:  Cytotec 100 mcg twice daily, nystatin 5 mg every 6 hours, Zofran 4 mg as needed for nausea, pantoprazole 40 mg twice daily, Carafate 1 mg before meals and at bedtime.   SOCIAL HISTORY: She is a Engineer, civil (consulting)nurse. She is widowed since June  2015. [place] August 2015. She has three underage sons. She is currently unemployed taking care of physical issues of her children. She had her father and stepmother in [place] area who are very supportive.   REVIEW OF SYSTEMS:   CONSTITUTIONAL: No fevers or chills, positive for some weight loss.   EYES: No double or blurred vision.   ENT: No hearing loss.  RESPIRATORY:  No shortness of breath or cough.   CARDIOVASCULAR: No chest pain or orthopnea.    GASTROINTESTINAL: Positive for abdominal pain and occasional nausea.   GENITOURINARY: No incontinence or frequency.   ENDOCRINE: No heat or cold intolerance.   LYMPHATIC: No anemia or easy bruising.   INTEGUMENTARY: No acne or rash.   MUSCULOSKELETAL: No muscle or joint pain.   NEUROLOGIC: No tingling or weakness.   PSYCHIATRIC: See history of present illness for details.   PHYSICAL EXAMINATION:  VITAL SIGNS: Blood pressure 137/92, pulse 76, respirations 18, and temperature 98.5.   GENERAL: This is a petite, middle-aged female in no acute distress.   HEENT: Summer pupils are equal, round, and reactive to light. Sclerae are anicteric.   NECK: Supple. No thyromegaly.   LUNGS: Clear to auscultation. No dullness to percussion.   HEART: Regular rhythm and rate. No murmurs, rubs, or gallops.   ABDOMEN: Soft, nontender, nondistended. Positive bowel sounds.   MUSCULOSKELETAL: Normal muscle strength in all extremities.   SKIN: No rashes or bruises.   LYMPHATIC: No cervical adenopathy.   NEUROLOGIC: Cranial nerves II through XII are intact.   LABORATORY DATA: Chemistries are within normal limits. Blood alcohol level is zero. LFTs within normal limits. Urine tox screen negative for substances. CBC within normal limits except for low platelets of 49. Urinalysis is not suggestive of urinary tract infection. Serum acetaminophen and salicylates are low.   MENTAL STATUS EXAMINATION:  On admission Summer Howard is  alert and oriented to person, place, time and situation. She is pleasant, polite and cooperative. She is well groomed and casually dressed. She maintains good eye contact. Her speech is of normal rhythm, rate and volume. Mood is depressed with a flat affect. Thought process is logical and goal oriented. Thought content: She denies thoughts of hurting herself or others, but disclosed passive suicidal ideation to her family prior to admission. There are no delusions or paranoia. There are no auditory or visual hallucinations. Her cognition is grossly intact. Registration, recall, short and long-term memory are intact. She is of average intelligence and fund of knowledge. Her insight and judgment are questionable.   SUICIDE RISK ASSESSMENT:  On admission this is a Howard with no past psychiatric history who experienced major loss exactly six months ago and is about to spend first Christmas holiday without her husband.   INITIAL DIAGNOSES:    AXIS I: Major depressive episode.  It is severe without psychotic features.  Alcohol use disorder, moderate.   AXIS II: Deferred.   AXIS III: Status post gastric bypass.   PLAN: Summer Howard was  admitted to [facility] unit for safety, stabilization and medication management.  1.  Suicidal ideation: Summer Howard is able to contract for safety.  2.  Mood. She was started on Remeron, but did not sleep well last night.  3.  For insomnia we will add Ambien. She responded to it well in Summer past.    MEDICAL: We will continue all medications prescribed by her gastroenterologist, pantoprazole sucralfate, Zofran, Cytotec and nystatin.      DISPOSITION: She will be discharged to home with her family. She needs a follow-up appointment with a psychiatrist and a therapist.   ____________________________ Ellin Goodie. Jennet Maduro, MD jbp:at D: 12/04/2014 11:30:05 ET T: 12/04/2014 12:02:36 ET JOB#: [id number]  cc: Max Nuno B.  Jennet Maduro, MD, <Dictator> Shari Prows MD ELECTRONICALLY SIGNED 12/16/2014 19:39

## 2015-05-30 ENCOUNTER — Ambulatory Visit: Payer: Medicaid Other | Admitting: Psychiatry

## 2015-06-03 ENCOUNTER — Other Ambulatory Visit: Payer: Self-pay

## 2015-06-03 DIAGNOSIS — D509 Iron deficiency anemia, unspecified: Secondary | ICD-10-CM

## 2015-06-04 ENCOUNTER — Inpatient Hospital Stay: Payer: Medicaid Other

## 2015-06-04 ENCOUNTER — Inpatient Hospital Stay: Payer: Medicaid Other | Attending: Internal Medicine

## 2015-06-18 ENCOUNTER — Inpatient Hospital Stay: Payer: Medicaid Other | Attending: Internal Medicine

## 2015-06-18 ENCOUNTER — Inpatient Hospital Stay: Payer: Medicaid Other

## 2015-06-18 DIAGNOSIS — D509 Iron deficiency anemia, unspecified: Secondary | ICD-10-CM | POA: Diagnosis present

## 2015-06-18 LAB — CBC WITH DIFFERENTIAL/PLATELET
Basophils Absolute: 0 10*3/uL (ref 0–0.1)
Basophils Relative: 1 %
EOS PCT: 4 %
Eosinophils Absolute: 0.2 10*3/uL (ref 0–0.7)
HEMATOCRIT: 41.1 % (ref 35.0–47.0)
HEMOGLOBIN: 13.4 g/dL (ref 12.0–16.0)
LYMPHS ABS: 1.5 10*3/uL (ref 1.0–3.6)
LYMPHS PCT: 26 %
MCH: 28.2 pg (ref 26.0–34.0)
MCHC: 32.5 g/dL (ref 32.0–36.0)
MCV: 86.8 fL (ref 80.0–100.0)
MONO ABS: 0.3 10*3/uL (ref 0.2–0.9)
MONOS PCT: 6 %
Neutro Abs: 3.6 10*3/uL (ref 1.4–6.5)
Neutrophils Relative %: 63 %
Platelets: 294 10*3/uL (ref 150–440)
RBC: 4.74 MIL/uL (ref 3.80–5.20)
RDW: 13.2 % (ref 11.5–14.5)
WBC: 5.7 10*3/uL (ref 3.6–11.0)

## 2015-06-18 LAB — IRON AND TIBC
Iron: 97 ug/dL (ref 28–170)
Saturation Ratios: 29 % (ref 10.4–31.8)
TIBC: 336 ug/dL (ref 250–450)
UIBC: 239 ug/dL

## 2015-06-18 LAB — FERRITIN: Ferritin: 194 ng/mL (ref 11–307)

## 2015-06-19 ENCOUNTER — Ambulatory Visit: Payer: Medicaid Other

## 2015-06-19 ENCOUNTER — Other Ambulatory Visit: Payer: Medicaid Other

## 2015-07-21 ENCOUNTER — Encounter: Payer: Self-pay | Admitting: Psychiatry

## 2015-07-21 ENCOUNTER — Ambulatory Visit (INDEPENDENT_AMBULATORY_CARE_PROVIDER_SITE_OTHER): Payer: Medicaid Other | Admitting: Psychiatry

## 2015-07-21 VITALS — BP 118/82 | HR 68 | Temp 97.7°F | Ht 62.0 in | Wt 124.6 lb

## 2015-07-21 DIAGNOSIS — F411 Generalized anxiety disorder: Secondary | ICD-10-CM

## 2015-07-21 DIAGNOSIS — F4323 Adjustment disorder with mixed anxiety and depressed mood: Secondary | ICD-10-CM | POA: Diagnosis not present

## 2015-07-21 MED ORDER — ALPRAZOLAM 0.25 MG PO TABS: 0.2500 mg | ORAL_TABLET | Freq: Every day | ORAL | Status: DC | PRN

## 2015-07-21 MED ORDER — ESCITALOPRAM OXALATE 10 MG PO TABS: ORAL_TABLET | ORAL | Status: DC

## 2015-07-21 NOTE — Progress Notes (Signed)
BH MD/PA/NP OP Progress Note  07/21/2015 10:50 AM Summer Howard  MRN:  [id number]  Subjective:  Patient presents for follow-up of her anxiety. She was last seen by this writer on 02/07/2015. At that time she was given prescriptions for Ambien and Xanax to use as needed. Patient case she never took any of these medications because she preferred to try to use non-pharmacological means to address her anxiety. Ever she states things have continued to be an issue for her. She states her predominant issue is anxiety. She states she's constantly stressed. She states there was another loss in the family. Her husband had died in Jun 10, 2015and this was a stressor for her anxiety. She then moved back to the [place] area to be near her father for support. However there is been ongoing tension in that relationship and she describes him sometimes as being controlling. Since her last visit her father-in-law passed away in June 22, 2015. She is also raising 3 teenage boys ages 82, 26 and 30. 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 She is contemplating changing jobs to working in the school system so she can be more on her children's schedule.  She was making an effort to find some activities for herself such as dancing but she stopped doing that a couple months ago. She states she is planning on resuming that this week and also plans to take a kayaking course today. She states her appetite is down and she feels like she has some decreased drive to engage in activities other than caring for her children.  She had previously seen a therapist in this clinic but that therapist departed. She is interested in resuming that. Chief Complaint:  Chief Complaint    Follow-up; Anxiety; Depression; Panic Attack; Stress     Visit Diagnosis:  No diagnosis found.  Past Medical History:  Past Medical History  Diagnosis Date  . Anxiety   . Depression      Past Surgical History  Procedure Laterality Date  . Cesarean section    . Cholecystectomy    . Appendectomy    . Gastric bypass    . Colon surgery     Family History:  Family History  Problem Relation Age of Onset  . Breast cancer Mother   . Hypertension Father   . Prostate cancer Father   . Hypertension Brother   . Anxiety disorder Brother    Social History:  History   Social History  . Marital Status: Widowed    Spouse Name: N/A  . Number of Children: N/A  . Years of Education: N/A   Social History Main Topics  . Smoking status: Never Smoker   . Smokeless tobacco: Never Used  . Alcohol Use: No  . Drug Use: No  . Sexual Activity: No   Other Topics Concern  . None   Social History Narrative  . None   Additional History:   Assessment:   Musculoskeletal: Strength & Muscle Tone: within normal limits Gait & Station: normal Patient leans: N/A  Psychiatric Specialty Exam: HPI  Review of Systems  Psychiatric/Behavioral: Negative for depression, suicidal ideas, hallucinations, memory loss and substance abuse. The patient is nervous/anxious and has insomnia.     Blood pressure 118/82, pulse 68, temperature 97.7 F (36.5 C), temperature source Tympanic, height 5\' 2"  (1.575 m), weight 124 lb 9.6 oz (56.518 kg), last menstrual period 05/21/2015, SpO2  99 %.Body mass index is 22.78 kg/(m^2).  General Appearance: Neat and Well Groomed  Eye Contact:  Good  Speech:  Normal Rate  Volume:  Normal  Mood:  Anxious  Affect:  Congruent  Thought Process:  Linear and Logical  Orientation:  Full (Time, Place, and Person)  Thought Content:  Negative  Suicidal Thoughts:  No  Homicidal Thoughts:  No  Memory:  Immediate;   Good Recent;   Good Remote;   Good  Judgement:  Good  Insight:  Good  Psychomotor Activity:  Negative  Concentration:  Good  Recall:  Good  Fund of Knowledge: Good  Language: Good  Akathisia:  Negative  Handed:  Right unknown   AIMS (if  indicated):  N/A  Assets:  Desire for Improvement Physical Health Vocational/Educational  ADL's:  Intact  Cognition: WNL  Sleep:  fair   Is the patient at risk to self?  No. Has the patient been a risk to self in the past 6 months?  No. Has the patient been a risk to self within the distant past?  No. Is the patient a risk to others?  No. Has the patient been a risk to others in the past 6 months?  No. Has the patient been a risk to others within the distant past?  No.  Current Medications: Current Outpatient Prescriptions  Medication Sig Dispense Refill  . calcium carbonate (TUMS EX) 750 MG chewable tablet Chew by mouth.    . Multiple Vitamin (MULTI-VITAMINS) TABS Take by mouth.    . ondansetron (ZOFRAN) 4 MG tablet Take by mouth.    . pantoprazole (PROTONIX) 40 MG tablet Take by mouth.    . sucralfate (CARAFATE) 1 GM/10ML suspension Take by mouth.    . ALPRAZolam (XANAX) 0.25 MG tablet Take 1 tablet (0.25 mg total) by mouth daily as needed for anxiety. 30 tablet 0  . ALPRAZolam (XANAX) 0.25 MG tablet Take 1 tablet (0.25 mg total) by mouth daily as needed for anxiety. 30 tablet 0  . escitalopram (LEXAPRO) 10 MG tablet Take one half a tablet in the morning for seven days then increase to one whole tablet in the morning. 30 tablet 1   No current facility-administered medications for this visit.    Medical Decision Making:  Review of Medication Regimen & Side Effects (2) and Review of New Medication or Change in Dosage (2)  Treatment Plan Summary:Medication management and Plan We will start Lexapro 5 mg in the morning for 7 days and then she will increase to 10 mg in the morning. Risk and benefits of been discussing patient's able to consent. We'll prescribe alprazolam 0.25 mg daily as needed for anxiety risk and benefits discussed and patient able to consent. Patient will follow up in 1 month. Patient has been encouraged call any questions or concerns prior to her next appointment.  Patient will also make appointment with another therapist in this clinic.   Wallace Going 07/21/2015, 10:50 AM

## 2015-07-25 ENCOUNTER — Ambulatory Visit: Payer: Medicaid Other | Admitting: Licensed Clinical Social Worker

## 2015-08-03 ENCOUNTER — Emergency Department: Payer: Medicaid Other

## 2015-08-03 ENCOUNTER — Encounter: Payer: Self-pay | Admitting: Emergency Medicine

## 2015-08-03 ENCOUNTER — Emergency Department
Admission: EM | Admit: 2015-08-03 | Discharge: 2015-08-03 | Disposition: A | Discharge status: Home / Self Care | Payer: Medicaid Other | Attending: Emergency Medicine | Admitting: Emergency Medicine

## 2015-08-03 DIAGNOSIS — Y998 Other external cause status: Secondary | ICD-10-CM | POA: Diagnosis not present

## 2015-08-03 DIAGNOSIS — W1839XA Other fall on same level, initial encounter: Secondary | ICD-10-CM | POA: Diagnosis not present

## 2015-08-03 DIAGNOSIS — Y9289 Other specified places as the place of occurrence of the external cause: Secondary | ICD-10-CM | POA: Insufficient documentation

## 2015-08-03 DIAGNOSIS — F1010 Alcohol abuse, uncomplicated: Secondary | ICD-10-CM | POA: Insufficient documentation

## 2015-08-03 DIAGNOSIS — S0003XA Contusion of scalp, initial encounter: Secondary | ICD-10-CM | POA: Diagnosis not present

## 2015-08-03 DIAGNOSIS — Z3202 Encounter for pregnancy test, result negative: Secondary | ICD-10-CM | POA: Insufficient documentation

## 2015-08-03 DIAGNOSIS — Y9389 Activity, other specified: Secondary | ICD-10-CM | POA: Diagnosis not present

## 2015-08-03 DIAGNOSIS — Z79899 Other long term (current) drug therapy: Secondary | ICD-10-CM | POA: Diagnosis not present

## 2015-08-03 DIAGNOSIS — F329 Major depressive disorder, single episode, unspecified: Secondary | ICD-10-CM | POA: Insufficient documentation

## 2015-08-03 DIAGNOSIS — R569 Unspecified convulsions: Secondary | ICD-10-CM | POA: Insufficient documentation

## 2015-08-03 LAB — CBC WITH DIFFERENTIAL/PLATELET
BASOS PCT: 0 %
Basophils Absolute: 0 10*3/uL (ref 0–0.1)
EOS ABS: 0.1 10*3/uL (ref 0–0.7)
Eosinophils Relative: 1 %
HCT: 44 % (ref 35.0–47.0)
HEMOGLOBIN: 14.8 g/dL (ref 12.0–16.0)
Lymphocytes Relative: 6 %
Lymphs Abs: 0.6 10*3/uL — ABNORMAL LOW (ref 1.0–3.6)
MCH: 28.5 pg (ref 26.0–34.0)
MCHC: 33.6 g/dL (ref 32.0–36.0)
MCV: 84.8 fL (ref 80.0–100.0)
Monocytes Absolute: 0.3 10*3/uL (ref 0.2–0.9)
Monocytes Relative: 3 %
NEUTROS PCT: 90 %
Neutro Abs: 9.4 10*3/uL — ABNORMAL HIGH (ref 1.4–6.5)
Platelets: 270 10*3/uL (ref 150–440)
RBC: 5.19 MIL/uL (ref 3.80–5.20)
RDW: 13.1 % (ref 11.5–14.5)
WBC: 10.5 10*3/uL (ref 3.6–11.0)

## 2015-08-03 LAB — BASIC METABOLIC PANEL
ANION GAP: 10 (ref 5–15)
BUN: 7 mg/dL (ref 6–20)
CALCIUM: 9 mg/dL (ref 8.9–10.3)
CO2: 27 mmol/L (ref 22–32)
Chloride: 93 mmol/L — ABNORMAL LOW (ref 101–111)
Creatinine, Ser: 0.72 mg/dL (ref 0.44–1.00)
Glucose, Bld: 100 mg/dL — ABNORMAL HIGH (ref 65–99)
Potassium: 3.6 mmol/L (ref 3.5–5.1)
Sodium: 130 mmol/L — ABNORMAL LOW (ref 135–145)

## 2015-08-03 LAB — URINALYSIS COMPLETE WITH MICROSCOPIC (ARMC ONLY)
BACTERIA UA: NONE SEEN
BILIRUBIN URINE: NEGATIVE
Glucose, UA: NEGATIVE mg/dL
Hgb urine dipstick: NEGATIVE
KETONES UR: NEGATIVE mg/dL
LEUKOCYTES UA: NEGATIVE
NITRITE: NEGATIVE
PROTEIN: NEGATIVE mg/dL
SPECIFIC GRAVITY, URINE: 1.009 (ref 1.005–1.030)
pH: 6 (ref 5.0–8.0)

## 2015-08-03 LAB — URINE DRUG SCREEN, QUALITATIVE (ARMC ONLY)
Amphetamines, Ur Screen: NOT DETECTED
BARBITURATES, UR SCREEN: NOT DETECTED
Benzodiazepine, Ur Scrn: NOT DETECTED
Cannabinoid 50 Ng, Ur ~~LOC~~: NOT DETECTED
Cocaine Metabolite,Ur ~~LOC~~: NOT DETECTED
MDMA (ECSTASY) UR SCREEN: NOT DETECTED
METHADONE SCREEN, URINE: NOT DETECTED
Opiate, Ur Screen: NOT DETECTED
Phencyclidine (PCP) Ur S: NOT DETECTED
TRICYCLIC, UR SCREEN: NOT DETECTED

## 2015-08-03 LAB — PREGNANCY, URINE: Preg Test, Ur: NEGATIVE

## 2015-08-03 LAB — ETHANOL: Alcohol, Ethyl (B): <5 mg/dL (ref <5)

## 2015-08-03 NOTE — ED Notes (Signed)
Discussed follow up care and discharge instructions with the patient and family member.

## 2015-08-03 NOTE — ED Provider Notes (Signed)
[facility] Emergency Department Provider Note   ____________________________________________  Time seen: 1020 I have reviewed the triage vital signs and the triage nursing note.  HISTORY  Chief Complaint Seizures   Historian Patient, and her father, and her son  HPI Summer Howard is a 44 y.o. female who was witnessed to have a generalized tonic-clonic seizure today by her teenage son. She was reportedly making the bed when she stiffened up and fell over and was shaking and unresponsive. After she stopped shaking he tried to check her pulse and felt like she didn't have a pulse and then it came back as weak. She came back around relatively quickly and was refusing transport by EMS. Her father then was able to convince her to come by car for evaluation. Patient states that she had seizures as a baby and was told that she was allergic to phenobarbital. Her father does not actually even remember this. She has been under extreme stress and depression over the past year after losing her husband to a heart attack. She is currently being treated for depression. She has been admitted for alcohol abuse. She is reporting to me that she is using Xanax only as needed and not very often, and denies the chance of withdrawal seizures. She is complaining of mild headache at the location of a posterior scalp hematoma on the left side. No additional injuries. No tongue biting, or incontinence.    Past Medical History  Diagnosis Date  . Anxiety   . Depression     Patient Active Problem List   Diagnosis Date Noted  . Absolute anemia 10/22/2014  . 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 Sex counseling 10/22/2014  . Malnutrition following gastrointestinal surgery 10/22/2014  . Feeling bilious 10/22/2014  . Primary thrombocytopenia 10/02/2014  . 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 Personal history of surgery to heart and great vessels, presenting hazards to health 07/31/2014  .  Gastroduodenal ulcer 07/31/2014  . Anxiety, generalized 03/07/2014  . Panic attack 03/07/2014  . Non-healing surgical wound 07/06/2013  . 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 SBO (small bowel obstruction) 04/02/2013  . Bariatric surgery status 06/21/2012  . Fatty infiltration of liver 02/25/2010  . Adiposity 02/25/2010    Past Surgical History  Procedure Laterality Date  . Cesarean section    . Cholecystectomy    . Appendectomy    . Gastric bypass    . Colon surgery      Current Outpatient Rx  Name  Route  Sig  Dispense  Refill  . ALPRAZolam (XANAX) 0.25 MG tablet   Oral   Take 1 tablet (0.25 mg total) by mouth daily as needed for anxiety.   30 tablet   0   . ALPRAZolam (XANAX) 0.25 MG tablet   Oral   Take 1 tablet (0.25 mg total) by mouth daily as needed for anxiety.   30 tablet   0   . calcium carbonate (TUMS EX) 750 MG chewable tablet   Oral   Chew by mouth.         . escitalopram (LEXAPRO) 10 MG tablet      Take one half a tablet in the morning for seven days then increase to one whole tablet in the morning.   30 tablet   1   . Multiple Vitamin (MULTI-VITAMINS) TABS   Oral   Take by mouth.         . ondansetron (ZOFRAN) 4 MG tablet   Oral   Take by  mouth.         . pantoprazole (PROTONIX) 40 MG tablet   Oral   Take by mouth.         . sucralfate (CARAFATE) 1 GM/10ML suspension   Oral   Take by mouth.           Allergies Phenobarbital and Sulfa antibiotics  Family History  Problem Relation Age of Onset  . Breast cancer Mother   . Hypertension Father   . Prostate cancer Father   . Hypertension Brother   . Anxiety disorder Brother     Social History Social History  Substance Use Topics  . Smoking status: Never Smoker   . Smokeless tobacco: Never Used  . Alcohol Use: No    Review of Systems  Constitutional: Negative for fever. Eyes: Negative for visual changes. ENT: Negative for sore throat. Cardiovascular: Negative for chest  pain. Respiratory: Negative for shortness of breath. Gastrointestinal: Negative for abdominal pain, vomiting and diarrhea. Genitourinary: Negative for dysuria. Musculoskeletal: Negative for back pain. Skin: Negative for rash. Neurological: Negative for headache. 10 point Review of Systems otherwise negative ____________________________________________   PHYSICAL EXAM:  VITAL SIGNS: ED Triage Vitals  Enc Vitals Group     BP 08/03/15 1017 173/112 mmHg     Pulse Rate 08/03/15 1017 108     Resp 08/03/15 1017 20     Temp 08/03/15 1017 98.6 F (37 C)     Temp Source 08/03/15 1017 Oral     SpO2 08/03/15 1017 100 %     Weight 08/03/15 1017 125 lb (56.7 kg)     Height 08/03/15 1017  (1.575 m)     Head Cir --      Peak Flow --      Pain Score --      Pain Loc --      Pain Edu? --      Excl. in GC? --      Constitutional: Alert and oriented. Well appearing and in no distress. Eyes: Conjunctivae are normal. PERRL. Normal extraocular movements. ENT   Head: Normocephalic. Large posterior left scalp hematoma. No bogginess or step-off.   Nose: No congestion/rhinnorhea.   Mouth/Throat: Mucous membranes are moist.   Neck: No stridor. No C-spine tenderness Cardiovascular/Chest: Normal rate, regular rhythm.  No murmurs, rubs, or gallops. Respiratory: Normal respiratory effort without tachypnea nor retractions. Breath sounds are clear and equal bilaterally. No wheezes/rales/rhonchi. Gastrointestinal: Soft. No distention, no guarding, no rebound. Nontender   Genitourinary/rectal:Deferred Musculoskeletal: Nontender with normal range of motion in all extremities. No joint effusions.  No lower extremity tenderness.  No edema. Neurologic:  Normal speech and language. No gross or focal neurologic deficits are appreciated. Skin:  Skin is warm, dry and intact. No rash noted. Psychiatric: Mood and affect are normal. Speech and behavior are normal. Patient exhibits appropriate  insight and judgment. No depression or suicidal ideation.  ____________________________________________   EKG I, Governor Rooks, MD, the attending physician have personally viewed and interpreted all ECGs.  No EKG performed ____________________________________________  LABS (pertinent positives/negatives)  Sodium 1:30, chloride 93, glucose 100 elect lites within normal limits Alcoholism 5 White blood cell count 10.5, hemoglobin 14.8, platelets 270 Urine drug screen negative Urinalysis negative Urine pregnancy test negative  ____________________________________________  RADIOLOGY All Xrays were viewed by me. Imaging interpreted by Radiologist.  CT head:   IMPRESSION: Left parietal scalp hematoma. No underlying skull fracture. No abnormal intracranial finding. __________________________________________  PROCEDURES  Procedure(s) performed: None  Critical Care  performed: None  ____________________________________________   ED COURSE / ASSESSMENT AND PLAN  CONSULTATIONS: phone consultation with Dr. Madalyn Rob, neurologist. Recommends no anticonvulsant as this is considered a first seizure.  Pertinent labs & imaging results that were available during my care of the patient were reviewed by me and considered in my medical decision making (see chart for details).   What's described is clinically a generalized tonic-clonic seizure. Currently she is back to baseline with no neurologic deficit. However there is this remote possibility that she had a seizure as a child, however I think this is probably of first-time seizure given that that was over 40 years ago. CT head was obtained due to large scalp hematoma, as well as a first time seizure, and this was negative for intracranial emergency.  Due to history of recent depression, and recent alcohol abuse, I did question the patient and the family about use and they all denied and stated that she was over that and doing decently  well from an emotional standpoint. Patient denies that this could be alcohol withdrawal, or benzodiazepine withdrawal related.  Neurologist recommended no anterior convulsive is treating this is a first time seizure. Patient and family note to return to the emergency department for any new seizures that she would likely get started on a epileptic medication. Seizure precautions and restrictions regarding driving were given.  Patient / Family / Caregiver informed of clinical course, medical decision-making process, and agree with plan.   I discussed return precautions, follow-up instructions, and discharged instructions with patient and/or family.  ___________________________________________   FINAL CLINICAL IMPRESSION(S) / ED DIAGNOSES   Final diagnoses:  Seizure  Scalp hematoma, initial encounter       Governor Rooks, MD 08/03/15 1326

## 2015-08-03 NOTE — ED Notes (Signed)
Patient to ED with family who reports seizure at home, reports EMS was called out but she refused transport.

## 2015-08-03 NOTE — Discharge Instructions (Signed)
It sounds like he did have a seizure today, however your exam and evaluation are reassuring. After discussing with the on-call neurologist, he does not remember men starting on seizure medication at this point in time. Dennard Nip and to follow-up with a neurologist and you are given the office number for Dr. Clelia Croft. Call tomorrow and make the next available appointment which may be in several weeks. Also follow-up with her primary care physician who may be a little worse with the additional studies like EEG and MRI of the brain prior to your neurology visit.  Return to the emergency department for any worsening condition including seizure the last vomited 5 minutes, any confusion or altered mental status, weakness, numbness, fever, stiff neck, or any other symptoms concerning to you.  Abruptly stopping benzodiazepine medication like Xanax and alcohol can result in withdrawal seizures so be mindful of this.  No driving or operating any machinery or climbing heights until you have been seen and evaluated and cleared by a neurologist.   Seizure, Adult A seizure is abnormal electrical activity in the brain. Seizures usually last from 30 seconds to 2 minutes. There are various types of seizures. Before a seizure, you may have a warning sensation (aura) that a seizure is about to occur. An aura may include the following symptoms:   Fear or anxiety.  Nausea.  Feeling like the room is spinning (vertigo).  Vision changes, such as seeing flashing lights or spots. Common symptoms during a seizure include:  A change in attention or behavior (altered mental status).  Convulsions with rhythmic jerking movements.  Drooling.  Rapid eye movements.  Grunting.  Loss of bladder and bowel control.  Bitter taste in the mouth.  Tongue biting. After a seizure, you may feel confused and sleepy. You may also have an injury resulting from convulsions during the seizure. HOME CARE INSTRUCTIONS   If you are  given medicines, take them exactly as prescribed by your health care provider.  Keep all follow-up appointments as directed by your health care provider.  Do not swim or drive or engage in risky activity during which a seizure could cause further injury to you or others until your health care provider says it is OK.  Get adequate rest.  Teach friends and family what to do if you have a seizure. They should:  Lay you on the ground to prevent a fall.  Put a cushion under your head.  Loosen any tight clothing around your neck.  Turn you on your side. If vomiting occurs, this helps keep your airway clear.  Stay with you until you recover.  Know whether or not you need emergency care. SEEK IMMEDIATE MEDICAL CARE IF:  The seizure lasts longer than 5 minutes.  The seizure is severe or you do not wake up immediately after the seizure.  You have an altered mental status after the seizure.  You are having more frequent or worsening seizures. Someone should drive you to the emergency department or call local emergency services (911 in [place] MAKE SURE YOU:  Understand these instructions.  Will watch your condition.  Will get help right away if you are not doing well or get worse. Document Released: 11/26/2000 Document Revised: 09/19/2013 Document Reviewed: 07/11/2013 [facility] Patient Information 2015 [place], [place]. This information is not intended to replace advice given to you by your health care provider. Make sure you discuss any questions you have with your health care provider.  Facial or Scalp Contusion A facial or scalp contusion  is a deep bruise on the face or head. Injuries to the face and head generally cause a lot of swelling, especially around the eyes. Contusions are the result of an injury that caused bleeding under the skin. The contusion may turn blue, purple, or yellow. Minor injuries will give you a painless contusion, but more severe contusions may stay painful and  swollen for a few weeks.  CAUSES  A facial or scalp contusion is caused by a blunt injury or trauma to the face or head area.  SIGNS AND SYMPTOMS   Swelling of the injured area.   Discoloration of the injured area.   Tenderness, soreness, or pain in the injured area.  DIAGNOSIS  The diagnosis can be made by taking a medical history and doing a physical exam. An X-ray exam, CT scan, or MRI may be needed to determine if there are any associated injuries, such as broken bones (fractures). TREATMENT  Often, the best treatment for a facial or scalp contusion is applying cold compresses to the injured area. Over-the-counter medicines may also be recommended for pain control.  HOME CARE INSTRUCTIONS   Only take over-the-counter or prescription medicines as directed by your health care provider.   Apply ice to the injured area.   Put ice in a plastic bag.   Place a towel between your skin and the bag.   Leave the ice on for 20 minutes, 2-3 times a day.  SEEK MEDICAL CARE IF:  You have bite problems.   You have pain with chewing.   You are concerned about facial defects. SEEK IMMEDIATE MEDICAL CARE IF:  You have severe pain or a headache that is not relieved by medicine.   You have unusual sleepiness, confusion, or personality changes.   You throw up (vomit).   You have a persistent nosebleed.   You have double vision or blurred vision.   You have fluid drainage from your nose or ear.   You have difficulty walking or using your arms or legs.  MAKE SURE YOU:   Understand these instructions.  Will watch your condition.  Will get help right away if you are not doing well or get worse. Document Released: 01/06/2005 Document Revised: 09/19/2013 Document Reviewed: 07/12/2013 [facility] Patient Information 2015 [place], [place]. This information is not intended to replace advice given to you by your health care provider. Make sure you discuss any questions you  have with your health care provider.

## 2015-08-21 ENCOUNTER — Ambulatory Visit: Payer: Medicaid Other | Admitting: Psychiatry

## 2015-09-26 ENCOUNTER — Ambulatory Visit: Payer: Self-pay | Admitting: Family Medicine

## 2015-09-26 ENCOUNTER — Ambulatory Visit: Payer: Self-pay

## 2015-09-26 ENCOUNTER — Other Ambulatory Visit: Payer: Self-pay

## 2015-09-30 ENCOUNTER — Inpatient Hospital Stay: Payer: Medicaid Other | Attending: Family Medicine | Admitting: Family Medicine

## 2015-09-30 ENCOUNTER — Inpatient Hospital Stay: Payer: Medicaid Other

## 2015-09-30 ENCOUNTER — Other Ambulatory Visit: Payer: Self-pay

## 2015-09-30 VITALS — BP 112/75 | HR 80

## 2015-09-30 VITALS — BP 116/71 | HR 82 | Temp 98.8°F | Wt 122.4 lb

## 2015-09-30 DIAGNOSIS — Z9049 Acquired absence of other specified parts of digestive tract: Secondary | ICD-10-CM | POA: Diagnosis not present

## 2015-09-30 DIAGNOSIS — F419 Anxiety disorder, unspecified: Secondary | ICD-10-CM | POA: Diagnosis not present

## 2015-09-30 DIAGNOSIS — Z79899 Other long term (current) drug therapy: Secondary | ICD-10-CM | POA: Diagnosis not present

## 2015-09-30 DIAGNOSIS — D509 Iron deficiency anemia, unspecified: Secondary | ICD-10-CM | POA: Insufficient documentation

## 2015-09-30 DIAGNOSIS — F329 Major depressive disorder, single episode, unspecified: Secondary | ICD-10-CM | POA: Insufficient documentation

## 2015-09-30 DIAGNOSIS — Z803 Family history of malignant neoplasm of breast: Secondary | ICD-10-CM | POA: Insufficient documentation

## 2015-09-30 DIAGNOSIS — Z9884 Bariatric surgery status: Secondary | ICD-10-CM | POA: Insufficient documentation

## 2015-09-30 LAB — CBC WITH DIFFERENTIAL/PLATELET
BASOS ABS: 0.1 10*3/uL (ref 0–0.1)
BASOS PCT: 1 %
EOS PCT: 2 %
Eosinophils Absolute: 0.1 10*3/uL (ref 0–0.7)
HCT: 36.9 % (ref 35.0–47.0)
Hemoglobin: 12.1 g/dL (ref 12.0–16.0)
LYMPHS PCT: 15 %
Lymphs Abs: 1.1 10*3/uL (ref 1.0–3.6)
MCH: 28.2 pg (ref 26.0–34.0)
MCHC: 32.8 g/dL (ref 32.0–36.0)
MCV: 85.9 fL (ref 80.0–100.0)
MONO ABS: 0.4 10*3/uL (ref 0.2–0.9)
Monocytes Relative: 5 %
Neutro Abs: 5.9 10*3/uL (ref 1.4–6.5)
Neutrophils Relative %: 77 %
PLATELETS: 368 10*3/uL (ref 150–440)
RBC: 4.29 MIL/uL (ref 3.80–5.20)
RDW: 13.6 % (ref 11.5–14.5)
WBC: 7.7 10*3/uL (ref 3.6–11.0)

## 2015-09-30 LAB — IRON AND TIBC
Iron: 18 ug/dL — ABNORMAL LOW (ref 28–170)
Saturation Ratios: 6 % — ABNORMAL LOW (ref 10.4–31.8)
TIBC: 305 ug/dL (ref 250–450)
UIBC: 287 ug/dL

## 2015-09-30 LAB — FERRITIN: Ferritin: 123 ng/mL (ref 11–307)

## 2015-09-30 MED ORDER — SODIUM CHLORIDE 0.9 % IV SOLN
200.0000 mg | Freq: Once | INTRAVENOUS | Status: AC
  Administered 2015-09-30: 200 mg via INTRAVENOUS
  Filled 2015-09-30: qty 10

## 2015-09-30 MED ORDER — SODIUM CHLORIDE 0.9 % IV SOLN
Freq: Once | INTRAVENOUS | Status: AC
  Administered 2015-09-30: 20 mL/h via INTRAVENOUS
  Filled 2015-09-30: qty 1000

## 2015-09-30 NOTE — Progress Notes (Signed)
[facility]  Telephone:([phone]) [phone]  [phone]) [phone]     Summer Howard DOB: 1971/04/22  MR#: [id number]  QIO#:[id number]#:[id number]  Patient Care Team: Rafael BihariJohn B Walker III, MD as PCP - General (Internal Medicine)  CHIEF COMPLAINT:  Chief Complaint  Patient presents with  . Anemia  patient originally diagnosed with iron deficiency anemia that was unresponsive to oral therapy. Patient status post gastric bypass surgery, a Roux-en-Y, November 2011. 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 Patient has been on maintenance 200 mg of Venofer every 16 weeks her previous established plan with Dr. Sherrlyn HockPandit.  INTERVAL HISTORY:  Patient is here today for further follow-up and treatment consideration regarding iron deficiency anemia. Patient reports feeling extremely well, as well as she has felt in months per patient. She states that she has been receiving maintenance Venofer infusions every 16 weeks to maintain her iron stores. She overall feels very well and denies any complaints.  REVIEW OF SYSTEMS:   Review of Systems  Constitutional: Negative for fever, chills, weight loss, malaise/fatigue and diaphoresis.  HENT: Negative for congestion, ear discharge, ear pain, hearing loss, nosebleeds, sore throat and tinnitus.   Eyes: Negative for blurred vision, double vision, photophobia, pain, discharge and redness.  Respiratory: Negative for cough, hemoptysis, sputum production, shortness of breath, wheezing and stridor.   Cardiovascular: Negative for chest pain, palpitations, orthopnea, claudication, leg swelling and PND.  Gastrointestinal: Negative for heartburn, nausea, vomiting, abdominal pain, diarrhea, constipation, blood in stool and melena.  Genitourinary: Negative.   Musculoskeletal: Negative.   Skin: Negative.   Neurological: Negative for dizziness, tingling, focal weakness, seizures, weakness and headaches.  Endo/Heme/Allergies: Does not  bruise/bleed easily.  Psychiatric/Behavioral: Negative for depression. The patient is not nervous/anxious and does not have insomnia.     As per HPI. Otherwise, a complete review of systems is negatve.  ONCOLOGY HISTORY:  No history exists.    PAST MEDICAL HISTORY: Past Medical History  Diagnosis Date  . Anxiety   . Depression     PAST SURGICAL HISTORY: Past Surgical History  Procedure Laterality Date  . Cesarean section    . Cholecystectomy    . Appendectomy    . Gastric bypass    . Colon surgery      FAMILY HISTORY Family History  Problem Relation Age of Onset  . Breast cancer Mother   . Hypertension Father   . Prostate cancer Father   . Hypertension Brother   . Anxiety disorder Brother     GYNECOLOGIC HISTORY:  No LMP recorded.     ADVANCED DIRECTIVES:    HEALTH MAINTENANCE: Social History  Substance Use Topics  . Smoking status: Never Smoker   . Smokeless tobacco: Never Used  . Alcohol Use: No     Colonoscopy:  PAP:  Bone density:  Lipid panel:  Allergies  Allergen Reactions  . Phenobarbital Rash    As a baby   . Sulfa Antibiotics Rash    Current Outpatient Prescriptions  Medication Sig Dispense Refill  . calcium carbonate (TUMS EX) 750 MG chewable tablet Chew by mouth.    . Multiple Vitamin (MULTI-VITAMINS) TABS Take by mouth.    . ondansetron (ZOFRAN) 4 MG tablet Take by mouth.    . pantoprazole (PROTONIX) 40 MG tablet Take by mouth.    . sucralfate (CARAFATE) 1 GM/10ML suspension Take by mouth.     No current facility-administered medications for this visit.    OBJECTIVE: BP 116/71 mmHg  Pulse 82  Temp(Src) 98.8 F (37.1 C) (Oral)  Wt 122 lb 5.7 oz (55.5 kg)   Body mass index is 22.37 kg/(m^2).    ECOG FS:0 - Asymptomatic  General: Well-developed, well-nourished, no acute distress. Eyes: Pink conjunctiva, anicteric sclera. HEENT: Normocephalic, moist mucous membranes, clear oropharnyx. Lungs: Clear to auscultation  bilaterally. Heart: Regular rate and rhythm. No rubs, murmurs, or gallops. Abdomen: Soft, nontender, nondistended. No organomegaly noted, normoactive bowel sounds. Musculoskeletal: No edema, cyanosis, or clubbing. Neuro: Alert, answering all questions appropriately. Cranial nerves grossly intact. Skin: No rashes or petechiae noted. Psych: Normal affect.   LAB RESULTS:  Appointment on 09/30/2015  Component Date Value Ref Range Status  . WBC 09/30/2015 7.7  3.6 - 11.0 K/uL Final  . RBC 09/30/2015 4.29  3.80 - 5.20 MIL/uL Final  . Hemoglobin 09/30/2015 12.1  12.0 - 16.0 g/dL Final  . HCT 40/98/1191 36.9  35.0 - 47.0 % Final  . MCV 09/30/2015 85.9  80.0 - 100.0 fL Final  . MCH 09/30/2015 28.2  26.0 - 34.0 pg Final  . MCHC 09/30/2015 32.8  32.0 - 36.0 g/dL Final  . RDW 47/82/9562 13.6  11.5 - 14.5 % Final  . Platelets 09/30/2015 368  150 - 440 K/uL Final  . Neutrophils Relative % 09/30/2015 77   Final  . Neutro Abs 09/30/2015 5.9  1.4 - 6.5 K/uL Final  . Lymphocytes Relative 09/30/2015 15   Final  . Lymphs Abs 09/30/2015 1.1  1.0 - 3.6 K/uL Final  . Monocytes Relative 09/30/2015 5   Final  . Monocytes Absolute 09/30/2015 0.4  0.2 - 0.9 K/uL Final  . Eosinophils Relative 09/30/2015 2   Final  . Eosinophils Absolute 09/30/2015 0.1  0 - 0.7 K/uL Final  . Basophils Relative 09/30/2015 1   Final  . Basophils Absolute 09/30/2015 0.1  0 - 0.1 K/uL Final  . Ferritin 09/30/2015 123  11 - 307 ng/mL Final  . Iron 09/30/2015 18* 28 - 170 ug/dL Final  . TIBC 13/07/6577 305  250 - 450 ug/dL Final  . Saturation Ratios 09/30/2015 6* 10.4 - 31.8 % Final  . UIBC 09/30/2015 287   Final    STUDIES: No results found.  ASSESSMENT:  Iron deficiency anemia  PLAN:   1. IDA. Patient previously seen with progressive iron deficiency anemia that was unresponsive to oral iron therapy. She is status post gastric bypass, Roux-en-Y, November 2011. Patient has had evaluation in July 2015 with EGD and reported  to have mild chronic gastritis but negative for dysplasia or malignancy. Patient is due for follow-up endoscopy at [facility] by the end of the year. Clinically patient appears to be doing very well on maintenance iron therapy with 200 mg of IV Venofer over. We'll continue with 200 mg IV Venofer today.   We'll have patient return in approximately 4 months for her next follow-up and consideration of Venofer infusion.  Patient expressed understanding and was in agreement with this plan. She also understands that She can call clinic at any time with any questions, concerns, or complaints.   Dr. Doylene Canning was available for consultation and review of plan of care for this patient.   Loann Quill, NP   09/30/2015 5:21 PM

## 2015-11-19 ENCOUNTER — Ambulatory Visit (INDEPENDENT_AMBULATORY_CARE_PROVIDER_SITE_OTHER): Payer: Medicaid Other | Admitting: Psychiatry

## 2015-11-19 ENCOUNTER — Encounter: Payer: Self-pay | Admitting: Psychiatry

## 2015-11-19 VITALS — BP 124/78 | HR 75 | Temp 97.4°F | Ht 62.0 in | Wt 119.4 lb

## 2015-11-19 DIAGNOSIS — F411 Generalized anxiety disorder: Secondary | ICD-10-CM

## 2015-11-19 DIAGNOSIS — F4323 Adjustment disorder with mixed anxiety and depressed mood: Secondary | ICD-10-CM | POA: Diagnosis not present

## 2015-11-19 MED ORDER — MIRTAZAPINE 15 MG PO TABS: 15.0000 mg | ORAL_TABLET | Freq: Every day | ORAL | Status: DC
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
## 2015-12-10 NOTE — Progress Notes (Signed)
Pharmacy notified.

## 2015-12-11 ENCOUNTER — Other Ambulatory Visit: Payer: Self-pay | Admitting: Nurse Practitioner

## 2015-12-17 ENCOUNTER — Ambulatory Visit: Payer: Medicaid Other | Admitting: Psychiatry

## 2016-01-20 ENCOUNTER — Emergency Department: Payer: Medicaid Other

## 2016-01-20 ENCOUNTER — Encounter: Payer: Self-pay | Admitting: Emergency Medicine

## 2016-01-20 ENCOUNTER — Inpatient Hospital Stay
Admission: EM | Admit: 2016-01-20 | Discharge: 2016-01-23 | DRG: 194 | Disposition: A | Discharge status: Home / Self Care | Payer: Medicaid Other | Attending: Internal Medicine | Admitting: Internal Medicine

## 2016-01-20 DIAGNOSIS — Z9049 Acquired absence of other specified parts of digestive tract: Secondary | ICD-10-CM | POA: Diagnosis not present

## 2016-01-20 DIAGNOSIS — Z888 Allergy status to other drugs, medicaments and biological substances status: Secondary | ICD-10-CM | POA: Diagnosis not present

## 2016-01-20 DIAGNOSIS — Z8249 Family history of ischemic heart disease and other diseases of the circulatory system: Secondary | ICD-10-CM | POA: Diagnosis not present

## 2016-01-20 DIAGNOSIS — F419 Anxiety disorder, unspecified: Secondary | ICD-10-CM | POA: Diagnosis present

## 2016-01-20 DIAGNOSIS — Z818 Family history of other mental and behavioral disorders: Secondary | ICD-10-CM

## 2016-01-20 DIAGNOSIS — R079 Chest pain, unspecified: Secondary | ICD-10-CM

## 2016-01-20 DIAGNOSIS — Z8711 Personal history of peptic ulcer disease: Secondary | ICD-10-CM | POA: Diagnosis not present

## 2016-01-20 DIAGNOSIS — R0781 Pleurodynia: Secondary | ICD-10-CM | POA: Diagnosis not present

## 2016-01-20 DIAGNOSIS — K259 Gastric ulcer, unspecified as acute or chronic, without hemorrhage or perforation: Secondary | ICD-10-CM | POA: Diagnosis present

## 2016-01-20 DIAGNOSIS — F329 Major depressive disorder, single episode, unspecified: Secondary | ICD-10-CM | POA: Diagnosis present

## 2016-01-20 DIAGNOSIS — D696 Thrombocytopenia, unspecified: Secondary | ICD-10-CM | POA: Diagnosis present

## 2016-01-20 DIAGNOSIS — Z803 Family history of malignant neoplasm of breast: Secondary | ICD-10-CM

## 2016-01-20 DIAGNOSIS — Z79899 Other long term (current) drug therapy: Secondary | ICD-10-CM | POA: Diagnosis not present

## 2016-01-20 DIAGNOSIS — Z882 Allergy status to sulfonamides status: Secondary | ICD-10-CM

## 2016-01-20 DIAGNOSIS — I248 Other forms of acute ischemic heart disease: Secondary | ICD-10-CM | POA: Diagnosis present

## 2016-01-20 DIAGNOSIS — Z9884 Bariatric surgery status: Secondary | ICD-10-CM

## 2016-01-20 DIAGNOSIS — J189 Pneumonia, unspecified organism: Secondary | ICD-10-CM | POA: Diagnosis present

## 2016-01-20 DIAGNOSIS — D509 Iron deficiency anemia, unspecified: Secondary | ICD-10-CM | POA: Diagnosis present

## 2016-01-20 DIAGNOSIS — R7989 Other specified abnormal findings of blood chemistry: Secondary | ICD-10-CM | POA: Diagnosis not present

## 2016-01-20 DIAGNOSIS — I213 ST elevation (STEMI) myocardial infarction of unspecified site: Secondary | ICD-10-CM | POA: Diagnosis not present

## 2016-01-20 HISTORY — DX: Gastric ulcer, unspecified as acute or chronic, without hemorrhage or perforation: K25.9

## 2016-01-20 HISTORY — DX: Iron deficiency anemia, unspecified: D50.9

## 2016-01-20 HISTORY — DX: Thrombocytopenia, unspecified: D69.6

## 2016-01-20 LAB — APTT: aPTT: 34 seconds (ref 24–36)

## 2016-01-20 LAB — BASIC METABOLIC PANEL
Anion gap: 13 (ref 5–15)
BUN: 8 mg/dL (ref 6–20)
CALCIUM: 8.7 mg/dL — AB (ref 8.9–10.3)
CHLORIDE: 97 mmol/L — AB (ref 101–111)
CO2: 23 mmol/L (ref 22–32)
CREATININE: 0.79 mg/dL (ref 0.44–1.00)
Glucose, Bld: 88 mg/dL (ref 65–99)
Potassium: 3.2 mmol/L — ABNORMAL LOW (ref 3.5–5.1)
SODIUM: 133 mmol/L — AB (ref 135–145)

## 2016-01-20 LAB — CBC
HEMATOCRIT: 36.2 % (ref 35.0–47.0)
Hemoglobin: 12 g/dL (ref 12.0–16.0)
MCH: 27 pg (ref 26.0–34.0)
MCHC: 33.2 g/dL (ref 32.0–36.0)
MCV: 81.4 fL (ref 80.0–100.0)
PLATELETS: 295 10*3/uL (ref 150–440)
RBC: 4.45 MIL/uL (ref 3.80–5.20)
RDW: 14.2 % (ref 11.5–14.5)
WBC: 7.9 10*3/uL (ref 3.6–11.0)

## 2016-01-20 LAB — PROTIME-INR
INR: 1.02
PROTHROMBIN TIME: 13.6 s (ref 11.4–15.0)

## 2016-01-20 LAB — TROPONIN I
TROPONIN I: 0.47 ng/mL — AB (ref <0.031)
TROPONIN I: 0.18 ng/mL — AB (ref <0.031)

## 2016-01-20 LAB — LIPID PANEL
CHOL/HDL RATIO: 2.5 ratio
CHOLESTEROL: 107 mg/dL (ref 0–200)
HDL: 42 mg/dL (ref >40)
LDL Cholesterol: 53 mg/dL (ref 0–99)
TRIGLYCERIDES: 61 mg/dL (ref <150)
VLDL: 12 mg/dL (ref 0–40)

## 2016-01-20 LAB — MRSA PCR SCREENING: MRSA BY PCR: NEGATIVE

## 2016-01-20 MED ORDER — ADULT MULTIVITAMIN W/MINERALS CH
1.0000 | ORAL_TABLET | Freq: Every day | ORAL | Status: DC
  Administered 2016-01-20 – 2016-01-23 (×4): 1 via ORAL
  Filled 2016-01-20 (×4): qty 1

## 2016-01-20 MED ORDER — HEPARIN (PORCINE) IN NACL 100-0.45 UNIT/ML-% IJ SOLN
1050.0000 [IU]/h | INTRAMUSCULAR | Status: DC
  Administered 2016-01-20: 650 [IU]/h via INTRAVENOUS
  Filled 2016-01-20 (×2): qty 250

## 2016-01-20 MED ORDER — HEPARIN BOLUS VIA INFUSION
3200.0000 [IU] | Freq: Once | INTRAVENOUS | Status: AC
  Administered 2016-01-20: 3200 [IU] via INTRAVENOUS
  Filled 2016-01-20: qty 3200

## 2016-01-20 MED ORDER — DEXTROSE 5 % IV SOLN
1.0000 g | Freq: Once | INTRAVENOUS | Status: AC
  Administered 2016-01-20: 1000 mg via INTRAVENOUS
  Filled 2016-01-20: qty 10

## 2016-01-20 MED ORDER — NITROGLYCERIN 0.4 MG SL SUBL: 0.4000 mg | SUBLINGUAL_TABLET | SUBLINGUAL | Status: DC | PRN

## 2016-01-20 MED ORDER — GUAIFENESIN 100 MG/5ML PO SOLN
5.0000 mL | ORAL | Status: DC | PRN
  Administered 2016-01-20 – 2016-01-23 (×5): 100 mg via ORAL
  Filled 2016-01-20 (×5): qty 10

## 2016-01-20 MED ORDER — CEPASTAT 14.5 MG MT LOZG
1.0000 | LOZENGE | OROMUCOSAL | Status: DC | PRN
  Administered 2016-01-21 – 2016-01-22 (×2): 1 via BUCCAL
  Filled 2016-01-20: qty 9

## 2016-01-20 MED ORDER — DEXTROSE 5 % IV SOLN
1.0000 g | INTRAVENOUS | Status: DC
  Administered 2016-01-21: 1 g via INTRAVENOUS
  Filled 2016-01-20 (×2): qty 10

## 2016-01-20 MED ORDER — PANTOPRAZOLE SODIUM 40 MG PO TBEC
40.0000 mg | DELAYED_RELEASE_TABLET | Freq: Every day | ORAL | Status: DC
  Administered 2016-01-20 – 2016-01-23 (×4): 40 mg via ORAL
  Filled 2016-01-20 (×4): qty 1

## 2016-01-20 MED ORDER — INFLUENZA VAC SPLIT QUAD 0.5 ML IM SUSY
0.5000 mL | PREFILLED_SYRINGE | INTRAMUSCULAR | Status: DC
  Filled 2016-01-20: qty 0.5

## 2016-01-20 MED ORDER — DEXTROSE 5 % IV SOLN
1.0000 g | INTRAVENOUS | Status: DC
  Administered 2016-01-20: 1 g via INTRAVENOUS

## 2016-01-20 MED ORDER — DEXTROSE 5 % IV SOLN
250.0000 mg | INTRAVENOUS | Status: DC
  Administered 2016-01-20: 250 mg via INTRAVENOUS

## 2016-01-20 MED ORDER — MIRTAZAPINE 15 MG PO TABS
15.0000 mg | ORAL_TABLET | Freq: Every day | ORAL | Status: DC
  Administered 2016-01-22: 15 mg via ORAL
  Filled 2016-01-20: qty 1

## 2016-01-20 MED ORDER — SUCRALFATE 1 GM/10ML PO SUSP
1.0000 g | Freq: Three times a day (TID) | ORAL | Status: DC
Start: 2016-01-21 — End: 2016-01-22
  Administered 2016-01-21 – 2016-01-22 (×6): 1 g via ORAL
  Filled 2016-01-20 (×10): qty 10

## 2016-01-20 MED ORDER — CALCIUM CARBONATE ANTACID 500 MG PO CHEW: 2.0000 | CHEWABLE_TABLET | ORAL | Status: DC | PRN

## 2016-01-20 MED ORDER — ACETAMINOPHEN 500 MG PO TABS
1000.0000 mg | ORAL_TABLET | Freq: Four times a day (QID) | ORAL | Status: DC | PRN
  Administered 2016-01-21 – 2016-01-22 (×2): 1000 mg via ORAL
  Filled 2016-01-20 (×2): qty 2

## 2016-01-20 MED ORDER — HEPARIN SODIUM (PORCINE) 5000 UNIT/ML IJ SOLN
INTRAMUSCULAR | Status: AC
  Filled 2016-01-20: qty 1

## 2016-01-20 MED ORDER — DEXTROSE 5 % IV SOLN
500.0000 mg | Freq: Once | INTRAVENOUS | Status: AC
  Administered 2016-01-20: 500 mg via INTRAVENOUS
  Filled 2016-01-20: qty 500

## 2016-01-20 MED ORDER — ONDANSETRON HCL 4 MG PO TABS
4.0000 mg | ORAL_TABLET | Freq: Three times a day (TID) | ORAL | Status: DC | PRN
  Administered 2016-01-22: 4 mg via ORAL
  Filled 2016-01-20: qty 1

## 2016-01-20 MED ORDER — DEXTROSE 5 % IV SOLN
250.0000 mg | INTRAVENOUS | Status: DC
  Administered 2016-01-21: 250 mg via INTRAVENOUS
  Filled 2016-01-20 (×2): qty 250

## 2016-01-20 MED ORDER — IBUPROFEN 400 MG PO TABS
400.0000 mg | ORAL_TABLET | Freq: Four times a day (QID) | ORAL | Status: DC | PRN
  Filled 2016-01-20: qty 1

## 2016-01-20 MED ORDER — ASPIRIN EC 325 MG PO TBEC
325.0000 mg | DELAYED_RELEASE_TABLET | Freq: Every day | ORAL | Status: DC
  Administered 2016-01-20 – 2016-01-23 (×4): 325 mg via ORAL
  Filled 2016-01-20 (×4): qty 1

## 2016-01-20 NOTE — ED Notes (Signed)
Report called to [facility] RN on telemetry floor - Rm changed to [phone]

## 2016-01-20 NOTE — ED Notes (Signed)
Iv heparin infusing.  Pt up to bathroom with assistance.  nsr on monitor.  Skin warm and dry.  Family with pt.

## 2016-01-20 NOTE — ED Notes (Signed)
Troponin level reported to Dr Pershing Proud

## 2016-01-20 NOTE — Progress Notes (Signed)
ANTICOAGULATION CONSULT NOTE - Initial Consult  Pharmacy Consult for Heparin Drip Indication: chest pain/ACS  Allergies  Allergen Reactions  . Phenobarbital Rash  . Sulfa Antibiotics Rash    Patient Measurements: Height:  (157.5 cm) Weight: 119 lb (53.978 kg) IBW/kg (Calculated) : 50.1 Heparin Dosing Weight: 54 kg  Vital Signs: Temp: 100.1 F (37.8 C) (02/07 1624) Temp Source: Oral (02/07 1624) BP: 133/90 mmHg (02/07 1739) Pulse Rate: 86 (02/07 1739)  Labs:  Recent Labs  01/20/16 1628  HGB 12.0  HCT 36.2  PLT 295  CREATININE 0.79  TROPONINI 0.47*    Estimated Creatinine Clearance: 72.5 mL/min (by C-G formula based on Cr of 0.79).   Medical History: Past Medical History  Diagnosis Date  . Anxiety   . Depression   . Iron deficiency anemia   . Thrombocytopenia (HCC)   . Gastric ulcer     Medications:  Scheduled:  . aspirin EC  325 mg Oral Daily  . heparin  3,200 Units Intravenous Once   Infusions:  . [START ON 01/21/2016] azithromycin    . [START ON 01/21/2016] cefTRIAXone (ROCEPHIN)  IV    . heparin      Assessment: Pharmacy consulted to initiate and titrate heparin drip in a 45 yo female for ACS/STEMI.  No anticoagulants on PTA med list or listed in MD notes.   APTT and INR pending.   Goal of Therapy:  Heparin level 0.3-0.7 units/ml Monitor platelets by anticoagulation protocol: Yes   Plan:  Give 3200 units bolus x 1 Start heparin infusion at 650 units/hr Check anti-Xa level in 6 hours and daily while on heparin Continue to monitor H&H and platelets  Lauramae Kneisley G 01/20/2016,7:11 PM

## 2016-01-20 NOTE — H&P (Addendum)
[facility] - [place] at [facility]   PATIENT NAME: Summer Howard    MR#:  [id number]  DATE OF BIRTH:  1971/12/03  DATE OF ADMISSION:  01/20/2016  PRIMARY CARE PHYSICIAN: Rafael Bihari, MD   REQUESTING/REFERRING PHYSICIAN: [place]itz  CHIEF COMPLAINT:   Chief Complaint  Patient presents with  . Cough    HISTORY OF PRESENT ILLNESS: [name]  is a 45 y.o. female with a known history of anxiety, depression, chronic anemia, thrombocytopenia, status post gastric bypass surgery, gastric ulcer disease- for last 4-5 days has been having cough and fever she went to urgent care Center 4 days ago when they found she has pneumonia and prescribed her Avelox, which she took for last 4 days but still continued to have chest pain. The pain is on the left side lower area and is worse with coughing, also had nausea and was not able to eat or drink enough for last 4 days, she also noted slight blood in her sputum concerned with this she went to her primary care doctor today and he suggested to go to emergency room for further management.  PAST MEDICAL HISTORY:   Past Medical History  Diagnosis Date  . Anxiety   . Depression     PAST SURGICAL HISTORY: Past Surgical History  Procedure Laterality Date  . Cesarean section    . Cholecystectomy    . Appendectomy    . Gastric bypass    . Colon surgery      SOCIAL HISTORY:  Social History  Substance Use Topics  . Smoking status: Never Smoker   . Smokeless tobacco: Never Used  . Alcohol Use: No    FAMILY HISTORY:  Family History  Problem Relation Age of Onset  . Breast cancer Mother   . Hypertension Father   . Prostate cancer Father   . Hypertension Brother   . Anxiety disorder Brother     DRUG ALLERGIES:  Allergies  Allergen Reactions  . Phenobarbital Rash  . Sulfa Antibiotics Rash    REVIEW OF SYSTEMS:   CONSTITUTIONAL: No fever, fatigue or weakness.  EYES: No blurred or double vision.  EARS, NOSE,  AND THROAT: No tinnitus or ear pain.  RESPIRATORY: positive for cough, no shortness of breath, wheezing , some hemoptysis.  CARDIOVASCULAR: with cough- she have left lower chest pain, no orthopnea, edema.  GASTROINTESTINAL: No nausea, vomiting, diarrhea or abdominal pain.  GENITOURINARY: No dysuria, hematuria.  ENDOCRINE: No polyuria, nocturia,  HEMATOLOGY: No anemia, easy bruising or bleeding SKIN: No rash or lesion. MUSCULOSKELETAL: No joint pain or arthritis.   NEUROLOGIC: No tingling, numbness, weakness.  PSYCHIATRY: No anxiety or depression.   MEDICATIONS AT HOME:  Prior to Admission medications   Medication Sig Start Date End Date Taking? Authorizing Provider  acetaminophen (TYLENOL) 500 MG tablet Take 1,000 mg by mouth every 6 (six) hours as needed for mild pain, fever or headache.   Yes Historical Provider, MD  calcium carbonate (TUMS - DOSED IN MG ELEMENTAL CALCIUM) 500 MG chewable tablet Chew 2 tablets by mouth as needed for indigestion or heartburn.   Yes Historical Provider, MD  Multiple Vitamin (MULTIVITAMIN WITH MINERALS) TABS tablet Take 1 tablet by mouth daily.   Yes Historical Provider, MD  ondansetron (ZOFRAN) 4 MG tablet Take 4 mg by mouth every 8 (eight) hours as needed for nausea or vomiting.    Yes Historical Provider, MD  pantoprazole (PROTONIX) 40 MG tablet Take 40 mg by mouth daily.  Yes Historical Provider, MD  sucralfate (CARAFATE) 1 GM/10ML suspension Take 1 g by mouth 3 (three) times daily with meals.   Yes Historical Provider, MD  mirtazapine (REMERON) 15 MG tablet Take 1 tablet (15 mg total) by mouth at bedtime. Patient not taking: Reported on 01/20/2016 11/19/15   Kerin Salen, MD      PHYSICAL EXAMINATION:   VITAL SIGNS: Blood pressure 133/90, pulse 86, temperature 100.1 F (37.8 C), temperature source Oral, resp. rate 14, height 5\' 2"  (1.575 m), weight 53.978 kg (119 lb), SpO2 93 %.  GENERAL:  45 y.o.-year-old patient lying in the bed with no acute  distress.  EYES: Pupils equal, round, reactive to light and accommodation. No scleral icterus. Extraocular muscles intact.  HEENT: Head atraumatic, normocephalic. Oropharynx and nasopharynx clear.  NECK:  Supple, no jugular venous distention. No thyroid enlargement, no tenderness.  LUNGS: Normal breath sounds bilaterally, no wheezing, some crepitation. No use of accessory muscles of respiration.  CARDIOVASCULAR: S1, S2 normal. No murmurs, rubs, or gallops.  ABDOMEN: Soft, nontender, nondistended. Bowel sounds present. No organomegaly or mass.  EXTREMITIES: No pedal edema, cyanosis, or clubbing.  NEUROLOGIC: Cranial nerves II through XII are intact. Muscle strength 5/5 in all extremities. Sensation intact. Gait not checked.  PSYCHIATRIC: The patient is alert and oriented x 3.  SKIN: No obvious rash, lesion, or ulcer.   LABORATORY PANEL:   CBC  Recent Labs Lab 01/20/16 1628  WBC 7.9  HGB 12.0  HCT 36.2  PLT 295  MCV 81.4  MCH 27.0  MCHC 33.2  RDW 14.2   ------------------------------------------------------------------------------------------------------------------  Chemistries  No results for input(s): NA, K, CL, CO2, GLUCOSE, BUN, CREATININE, CALCIUM, MG, AST, ALT, ALKPHOS, BILITOT in the last 168 hours.  Invalid input(s): GFRCGP ------------------------------------------------------------------------------------------------------------------ CrCl cannot be calculated (Patient has no serum creatinine result on file.). ------------------------------------------------------------------------------------------------------------------ No results for input(s): TSH, T4TOTAL, T3FREE, THYROIDAB in the last 72 hours.  Invalid input(s): FREET3   Coagulation profile No results for input(s): INR, PROTIME in the last 168 hours. ------------------------------------------------------------------------------------------------------------------- No results for input(s): DDIMER in the  last 72 hours. -------------------------------------------------------------------------------------------------------------------  Cardiac Enzymes No results for input(s): CKMB, TROPONINI, MYOGLOBIN in the last 168 hours.  Invalid input(s): CK ------------------------------------------------------------------------------------------------------------------ Invalid input(s): POCBNP  ---------------------------------------------------------------------------------------------------------------  Urinalysis    Component Value Date/Time   COLORURINE YELLOW* 08/03/2015 1116   COLORURINE Yellow 12/02/2014 1401   APPEARANCEUR CLEAR* 08/03/2015 1116   APPEARANCEUR Hazy 12/02/2014 1401   LABSPEC 1.009 08/03/2015 1116   LABSPEC 1.019 12/02/2014 1401   PHURINE 6.0 08/03/2015 1116   PHURINE 5.0 12/02/2014 1401   GLUCOSEU NEGATIVE 08/03/2015 1116   GLUCOSEU Negative 12/02/2014 1401   HGBUR NEGATIVE 08/03/2015 1116   HGBUR Negative 12/02/2014 1401   BILIRUBINUR NEGATIVE 08/03/2015 1116   BILIRUBINUR Negative 12/02/2014 1401   KETONESUR NEGATIVE 08/03/2015 1116   KETONESUR Trace 12/02/2014 1401   PROTEINUR NEGATIVE 08/03/2015 1116   PROTEINUR Negative 12/02/2014 1401   NITRITE NEGATIVE 08/03/2015 1116   NITRITE Negative 12/02/2014 1401   LEUKOCYTESUR NEGATIVE 08/03/2015 1116   LEUKOCYTESUR Negative 12/02/2014 1401     RADIOLOGY: No results found.   IMPRESSION AND PLAN:  * Pneumonia  We'll give her IV Rocephin and azithromycin for now, check her MRSA screening from nasal swab, get sputum culture.  Repeat chest x-ray tomorrow to compare.  * pleuritic chest pain  Likely due to pneumonia, will give ibuprofen for that and ginkgo suppressant to help.  * NSTEMI   Troponin reported 0.4  Will start heparin drip, and follow serial trop, monitor on tele.   Give asa and Nitro for pain.   Check lipid panel.   Call cardio consult.  * Depression  Continue Remeron  * Gastric ulcer   Continue pantoprazole and sucralfate.  * Nausea  Continue Zofran as needed.   All the records are reviewed and case discussed with ED provider. Management plans discussed with the patient, family and they are in agreement.  CODE STATUS: Full code. Code Status History    This patient does not have a recorded code status. Please follow your organizational policy for patients in this situation.       TOTAL TIME TAKING CARE OF THIS PATIENT: 50 minutes.    Altamese Dilling M.D on 01/20/2016   Between 7am to 6pm - Pager - [phone]  After 6pm go to [email or web address] - password [email or web address]  [place] [facility]  Office  [phone]  CC: Primary care physician; Rafael Bihari, MD   Note: This dictation was prepared with Dragon dictation along with smaller phrase technology. Any transcriptional errors that result from this process are unintentional.

## 2016-01-20 NOTE — ED Notes (Signed)
Pt to ed with c/o cough, congestion, states started on avelox on Saturday.  Sent from mds office today for pneumonia.

## 2016-01-20 NOTE — ED Notes (Signed)
Pt c/o lt side pain radiating into her back and difficulty breathing due to tightness in chest area and cough - Pain from cough is causing distress to the pt because she feels like she cannot catch her breath - at this time pt appears in no acute distress - pt went to [facility] Sat and was started on Avelox - dx with pnuemonia - returned to [facility] today and they told her to come to ER due to "how wide spread the pneumonia was"

## 2016-01-20 NOTE — ED Provider Notes (Addendum)
[facility] Emergency Department Provider Note  ____________________________________________  Time seen: Approximately 5:21 PM  I have reviewed the triage vital signs and the nursing notes.   HISTORY  Chief Complaint Cough    HPI Summer Howard is a 45 y.o. female with a history of gastric bypass and small bowel obstruction who is presenting today with cough over the past week. She says that she was started on Avelox this past Saturday and despite taking the antibiotic she has had worsening left-sided chest pain as well as blood-tinged sputum from her cough. She was seen by her primary care doctor today who then sent her into the hospital for further evaluation.   Past Medical History  Diagnosis Date  . Anxiety   . Depression     Patient Active Problem List   Diagnosis Date Noted  . Absolute anemia 10/22/2014  . 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 Sex counseling 10/22/2014  . Malnutrition following gastrointestinal surgery 10/22/2014  . Feeling bilious 10/22/2014  . 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 Personal history of surgery to heart and great vessels, presenting hazards to health 07/31/2014  . Gastroduodenal ulcer 07/31/2014  . Anxiety, generalized 03/07/2014  . Panic attack 03/07/2014  . Non-healing surgical wound 07/06/2013  . 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 SBO (small bowel obstruction) (HCC) 04/02/2013  . Bariatric surgery status 06/21/2012  . Fatty infiltration of liver 02/25/2010  . Adiposity 02/25/2010    Past Surgical History  Procedure Laterality Date  . Cesarean section    . Cholecystectomy    . Appendectomy    . Gastric bypass    . Colon surgery      Current Outpatient Rx  Name  Route  Sig  Dispense  Refill  . acetaminophen (TYLENOL) 500 MG tablet   Oral   Take 1,000 mg by mouth every 6 (six) hours as needed for mild pain, fever or headache.         . calcium  carbonate (TUMS - DOSED IN MG ELEMENTAL CALCIUM) 500 MG chewable tablet   Oral   Chew 2 tablets by mouth as needed for indigestion or heartburn.         . Multiple Vitamin (MULTIVITAMIN WITH MINERALS) TABS tablet   Oral   Take 1 tablet by mouth daily.         . ondansetron (ZOFRAN) 4 MG tablet   Oral   Take 4 mg by mouth every 8 (eight) hours as needed for nausea or vomiting.          . pantoprazole (PROTONIX) 40 MG tablet   Oral   Take 40 mg by mouth daily.          . sucralfate (CARAFATE) 1 GM/10ML suspension   Oral   Take 1 g by mouth 3 (three) times daily with meals.         . mirtazapine (REMERON) 15 MG tablet   Oral   Take 1 tablet (15 mg total) by mouth at bedtime. Patient not taking: Reported on 01/20/2016   30 tablet   1     Allergies Phenobarbital and Sulfa antibiotics  Family History  Problem Relation Age of Onset  . Breast cancer Mother   . Hypertension Father   . Prostate cancer Father   . Hypertension Brother   . Anxiety disorder Brother     Social History Social History  Substance Use Topics  . Smoking status: Never Smoker   .  Smokeless tobacco: Never Used  . Alcohol Use: No    Review of Systems Constitutional: No fever/chills Eyes: No visual changes. ENT: No sore throat. Cardiovascular: Left-sided chest pain Respiratory: Shortness of breath with cough Gastrointestinal: No abdominal pain.  No nausea, no vomiting.  No diarrhea.  No constipation. Genitourinary: Negative for dysuria. Musculoskeletal: Negative for back pain. Skin: Negative for rash. Neurological: Negative for headaches, focal weakness or numbness.  10-point ROS otherwise negative.  ____________________________________________   PHYSICAL EXAM:  VITAL SIGNS: ED Triage Vitals  Enc Vitals Group     BP 01/20/16 1624 131/84 mmHg     Pulse Rate 01/20/16 1624 94     Resp 01/20/16 1624 20     Temp 01/20/16 1624 100.1 F (37.8 C)     Temp Source 01/20/16 1624 Oral      SpO2 01/20/16 1624 99 %     Weight 01/20/16 1624 119 lb (53.978 kg)     Height 01/20/16 1624  (1.575 m)     Head Cir --      Peak Flow --      Pain Score 01/20/16 1625 10     Pain Loc --      Pain Edu? --      Excl. in GC? --     Constitutional: Alert and oriented. Well appearing and in no acute distress. Eyes: Conjunctivae are normal. PERRL. EOMI. Head: Atraumatic. Nose: No congestion/rhinnorhea. Mouth/Throat: Mucous membranes are moist.  Oropharynx non-erythematous. Neck: No stridor.   Cardiovascular: Normal rate, regular rhythm. Grossly normal heart sounds.  Good peripheral circulation. Respiratory: Normal respiratory effort.  No retractions. Rales to the left middle as well as lower fields. Gastrointestinal: Soft and nontender. No distention. No abdominal bruits. No CVA tenderness. Musculoskeletal: No lower extremity tenderness nor edema.  No joint effusions. Neurologic:  Normal speech and language. No gross focal neurologic deficits are appreciated. No gait instability. Skin:  Skin is warm, dry and intact. No rash noted. Psychiatric: Mood and affect are normal. Speech and behavior are normal.  ____________________________________________   LABS (all labs ordered are listed, but only abnormal results are displayed)  Labs Reviewed  BASIC METABOLIC PANEL  TROPONIN I  CBC   ____________________________________________  EKG  ED ECG REPORT I, Lamel Mccarley,  Teena Irani, the attending physician, personally viewed and interpreted this ECG.   Date: 01/20/2016  EKG Time: 1651  Rate: 78  Rhythm: normal sinus rhythm  Axis: Normal axis  Intervals:none  ST&T Change: No ST segment elevation or depression. No abnormal T-wave inversion.  ____________________________________________  RADIOLOGY  From report done earlier today at the [facility] clinic. The patient has a left lower lobe and likely lingular  pneumonia. ____________________________________________   PROCEDURES    ____________________________________________   INITIAL IMPRESSION / ASSESSMENT AND PLAN / ED COURSE  Pertinent labs & imaging results that were available during my care of the patient were reviewed by me and considered in my medical decision making (see chart for details).  Patient with failure of outpatient bionics. We'll start on ceftriaxone and azithromycin. Denies any hospitalization within the last 3 months. Signed out to Dr. Elisabeth Pigeon. Patient with need for admission and understands the plan and willing to comply. ____________________________________________   FINAL CLINICAL IMPRESSION(S) / ED DIAGNOSES  Community acquired pneumonia.    Myrna Blazer, MD 01/20/16 1725  Patient found to have an elevated troponin of 0.47.  Reporting a dull left-sided chest pain which is worse with breathing and coughing. Correlates the location of  the pneumonia. Discussed this with Dr. Junious Dresser of the medicine service who says he would be starting heparin. Patient has a normal platelet count as well as a hemoglobin today. I also asked her about any rectal bleeding and she denies.  Myrna Blazer, MD 01/20/16 [id number]

## 2016-01-20 NOTE — ED Notes (Signed)
Pt allergic to sulfa and phenobarbital

## 2016-01-20 NOTE — ED Notes (Signed)
Pt denied cxr stating she had just had one at [facility]

## 2016-01-21 ENCOUNTER — Encounter: Payer: Self-pay | Admitting: Student

## 2016-01-21 ENCOUNTER — Inpatient Hospital Stay: Payer: Medicaid Other

## 2016-01-21 ENCOUNTER — Inpatient Hospital Stay (HOSPITAL_COMMUNITY)
Admit: 2016-01-21 | Discharge: 2016-01-21 | Disposition: A | Discharge status: Home / Self Care | Payer: Medicaid Other | Attending: Internal Medicine | Admitting: Internal Medicine

## 2016-01-21 DIAGNOSIS — I213 ST elevation (STEMI) myocardial infarction of unspecified site: Secondary | ICD-10-CM

## 2016-01-21 DIAGNOSIS — R7989 Other specified abnormal findings of blood chemistry: Secondary | ICD-10-CM

## 2016-01-21 DIAGNOSIS — R079 Chest pain, unspecified: Secondary | ICD-10-CM

## 2016-01-21 DIAGNOSIS — J189 Pneumonia, unspecified organism: Secondary | ICD-10-CM

## 2016-01-21 DIAGNOSIS — R0781 Pleurodynia: Secondary | ICD-10-CM

## 2016-01-21 HISTORY — DX: Pneumonia, unspecified organism: J18.9

## 2016-01-21 LAB — EXPECTORATED SPUTUM ASSESSMENT W REFEX TO RESP CULTURE: SPECIAL REQUESTS: NORMAL

## 2016-01-21 LAB — BASIC METABOLIC PANEL
ANION GAP: 8 (ref 5–15)
BUN: 6 mg/dL (ref 6–20)
CALCIUM: 8.1 mg/dL — AB (ref 8.9–10.3)
CO2: 26 mmol/L (ref 22–32)
CREATININE: 0.62 mg/dL (ref 0.44–1.00)
Chloride: 102 mmol/L (ref 101–111)
GFR calc Af Amer: >60 mL/min (ref >60)
GLUCOSE: 105 mg/dL — AB (ref 65–99)
Potassium: 3.1 mmol/L — ABNORMAL LOW (ref 3.5–5.1)
Sodium: 136 mmol/L (ref 135–145)

## 2016-01-21 LAB — TROPONIN I: TROPONIN I: 0.05 ng/mL — AB (ref <0.031)

## 2016-01-21 LAB — CBC
HCT: 30.9 % — ABNORMAL LOW (ref 35.0–47.0)
HEMOGLOBIN: 10.4 g/dL — AB (ref 12.0–16.0)
MCH: 27.3 pg (ref 26.0–34.0)
MCHC: 33.8 g/dL (ref 32.0–36.0)
MCV: 80.7 fL (ref 80.0–100.0)
PLATELETS: 236 10*3/uL (ref 150–440)
RBC: 3.83 MIL/uL (ref 3.80–5.20)
RDW: 13.9 % (ref 11.5–14.5)
WBC: 6.7 10*3/uL (ref 3.6–11.0)

## 2016-01-21 LAB — HEPARIN LEVEL (UNFRACTIONATED): Heparin Unfractionated: <0.1 IU/mL — ABNORMAL LOW (ref 0.30–0.70)

## 2016-01-21 MED ORDER — POTASSIUM CHLORIDE 20 MEQ PO PACK
40.0000 meq | PACK | Freq: Two times a day (BID) | ORAL | Status: AC
  Administered 2016-01-21 – 2016-01-22 (×3): 40 meq via ORAL
  Filled 2016-01-21 (×3): qty 2

## 2016-01-21 MED ORDER — HEPARIN BOLUS VIA INFUSION
1600.0000 [IU] | Freq: Once | INTRAVENOUS | Status: AC
  Administered 2016-01-21: 1600 [IU] via INTRAVENOUS
  Filled 2016-01-21: qty 1600

## 2016-01-21 MED ORDER — HYDROCODONE-ACETAMINOPHEN 5-325 MG PO TABS
ORAL_TABLET | ORAL | Status: AC
  Administered 2016-01-21: 1 via ORAL
  Filled 2016-01-21: qty 1

## 2016-01-21 MED ORDER — IBUPROFEN 400 MG PO TABS: 400.0000 mg | ORAL_TABLET | Freq: Four times a day (QID) | ORAL | Status: DC | PRN

## 2016-01-21 MED ORDER — HEPARIN BOLUS VIA INFUSION
1650.0000 [IU] | Freq: Once | INTRAVENOUS | Status: DC
  Filled 2016-01-21: qty 1650

## 2016-01-21 MED ORDER — HYDROCODONE-ACETAMINOPHEN 5-325 MG PO TABS
1.0000 | ORAL_TABLET | ORAL | Status: DC | PRN
  Administered 2016-01-21 – 2016-01-22 (×9): 1 via ORAL
  Filled 2016-01-21 (×8): qty 1

## 2016-01-21 NOTE — Progress Notes (Signed)
ANTICOAGULATION CONSULT NOTE - Initial Consult  Pharmacy Consult for Heparin Drip Indication: chest pain/ACS  Allergies  Allergen Reactions  . Phenobarbital Rash  . Sulfa Antibiotics Rash    Patient Measurements: Height:  (157.5 cm) Weight: 121 lb (54.885 kg) IBW/kg (Calculated) : 50.1 Heparin Dosing Weight: 54 kg  Vital Signs: Temp: 99.9 F (37.7 C) (02/07 2031) Temp Source: Oral (02/07 2031) BP: 114/58 mmHg (02/07 2031) Pulse Rate: 90 (02/07 2031)  Labs:  Recent Labs  01/20/16 1628 01/20/16 2206 01/21/16 0225  HGB 12.0  --  10.4*  HCT 36.2  --  30.9*  PLT 295  --  236  APTT 34  --   --   LABPROT 13.6  --   --   INR 1.02  --   --   HEPARINUNFRC  --   --  <0.10*  CREATININE 0.79  --  0.62  TROPONINI 0.47* 0.18* 0.05*    Estimated Creatinine Clearance: 72.5 mL/min (by C-G formula based on Cr of 0.62).   Medical History: Past Medical History  Diagnosis Date  . Anxiety   . Depression   . Iron deficiency anemia   . Thrombocytopenia (HCC)   . Gastric ulcer     Medications:  Scheduled:  . aspirin EC  325 mg Oral Daily  . azithromycin  250 mg Intravenous Q24H  . cefTRIAXone (ROCEPHIN)  IV  1 g Intravenous Q24H  . heparin      . heparin  1,600 Units Intravenous Once  . Influenza vac split quadrivalent PF  0.5 mL Intramuscular Tomorrow-1000  . mirtazapine  15 mg Oral QHS  . multivitamin with minerals  1 tablet Oral Daily  . pantoprazole  40 mg Oral Daily  . sucralfate  1 g Oral TID WC   Infusions:  . heparin 650 Units/hr (01/20/16 1922)    Assessment: Pharmacy consulted to initiate and titrate heparin drip in a 45 yo female for ACS/STEMI.  No anticoagulants on PTA med list or listed in MD notes.   APTT and INR pending.   Goal of Therapy:  Heparin level 0.3-0.7 units/ml Monitor platelets by anticoagulation protocol: Yes   Plan:  Give 3200 units bolus x 1 Start heparin infusion at 650 units/hr Check anti-Xa level in 6 hours and daily while  on heparin Continue to monitor H&H and platelets   2/8 02:30 heparin level <0.1. 1600 unit bolus and increase to 850 units/hr. Recheck in 6 hours.  Kaelem Brach S 01/21/2016,3:58 AM

## 2016-01-21 NOTE — Progress Notes (Signed)
*  PRELIMINARY RESULTS* Echocardiogram 2D Echocardiogram has been performed.  Georgann Housekeeper Hege 01/21/2016, 11:07 AM

## 2016-01-21 NOTE — Progress Notes (Signed)
[facility] - [place] at [facility]   PATIENT NAME: Nesha Counihan    MR#:  [id number]  DATE OF BIRTH:  09/11/1971  SUBJECTIVE:  CHIEF COMPLAINT:   Chief Complaint  Patient presents with  . Cough    Have chest pain with cough, but feels little better. Troponin stable on follow up.  REVIEW OF SYSTEMS:  CONSTITUTIONAL: No fever, fatigue or weakness.  EYES: No blurred or double vision.  EARS, NOSE, AND THROAT: No tinnitus or ear pain.  RESPIRATORY: positive for cough, no shortness of breath, wheezing , some hemoptysis.  CARDIOVASCULAR: with cough- she have left lower chest pain, no orthopnea, edema.  GASTROINTESTINAL: No nausea, vomiting, diarrhea or abdominal pain.  GENITOURINARY: No dysuria, hematuria.  ENDOCRINE: No polyuria, nocturia,  HEMATOLOGY: No anemia, easy bruising or bleeding SKIN: No rash or lesion. MUSCULOSKELETAL: No joint pain or arthritis.  NEUROLOGIC: No tingling, numbness, weakness.  PSYCHIATRY: No anxiety or depression.   ROS  DRUG ALLERGIES:   Allergies  Allergen Reactions  . Phenobarbital Rash  . Sulfa Antibiotics Rash    VITALS:  Blood pressure 118/66, pulse 75, temperature 98.5 F (36.9 C), temperature source Oral, resp. rate 22, height  (1.575 m), weight 54.885 kg (121 lb), SpO2 100 %.  PHYSICAL EXAMINATION:   GENERAL: 45 y.o.-year-old patient lying in the bed with no acute distress.  EYES: Pupils equal, round, reactive to light and accommodation. No scleral icterus. Extraocular muscles intact.  HEENT: Head atraumatic, normocephalic. Oropharynx and nasopharynx clear.  NECK: Supple, no jugular venous distention. No thyroid enlargement, no tenderness.  LUNGS: Normal breath sounds bilaterally, no wheezing, some crepitation. No use of accessory muscles of respiration.  CARDIOVASCULAR: S1, S2 normal. No murmurs, rubs, or gallops.  ABDOMEN: Soft, nontender, nondistended. Bowel sounds present. No  organomegaly or mass.  EXTREMITIES: No pedal edema, cyanosis, or clubbing.  NEUROLOGIC: Cranial nerves II through XII are intact. Muscle strength 5/5 in all extremities. Sensation intact. Gait not checked.  PSYCHIATRIC: The patient is alert and oriented x 3.  SKIN: No obvious rash, lesion, or ulcer.   Physical Exam LABORATORY PANEL:   CBC  Recent Labs Lab 01/21/16 0225  WBC 6.7  HGB 10.4*  HCT 30.9*  PLT 236   ------------------------------------------------------------------------------------------------------------------  Chemistries   Recent Labs Lab 01/21/16 0225  NA 136  K 3.1*  CL 102  CO2 26  GLUCOSE 105*  BUN 6  CREATININE 0.62  CALCIUM 8.1*   ------------------------------------------------------------------------------------------------------------------  Cardiac Enzymes  Recent Labs Lab 01/20/16 2206 01/21/16 0225  TROPONINI 0.18* 0.05*   ------------------------------------------------------------------------------------------------------------------  RADIOLOGY:  Dg Chest 2 View  01/21/2016  CLINICAL DATA:  Pneumonia EXAM: CHEST  2 VIEW COMPARISON:  None. FINDINGS: There is airspace consolidation throughout much of the left lower lobe as well as portions of the lingula. Right lung is clear. Heart size and pulmonary vascularity are normal. No adenopathy. There is a small bone island in the right proximal humerus. IMPRESSION: Extensive pneumonia involving portions of the left lower lobe and lingula. Right lung clear. Cardiac silhouette within normal limits. Followup PA and lateral chest radiographs recommended in 3-4 weeks following trial of antibiotic therapy to ensure resolution and exclude underlying malignancy. Electronically Signed   By: Bretta Bang III M.D.   On: 01/21/2016 07:18    ASSESSMENT AND PLAN:   Principal Problem:   Pneumonia Active Problems:   Pleuritic chest pain   Chest pain with low risk for cardiac etiology  *  Pneumonia  IV Rocephin and azithromycin for now, negative MRSA screening from nasal swab,  Pending result of sputum culture. Repeat chest x-ray tomorrow to compare.  * pleuritic chest pain Likely due to pneumonia, will give ibuprofen.  * NSTEMI- suspected but ruled out.  Troponin reported 0.4> 0.1  on heparin drip, and follow serial trop, monitor on tele.  Given asa and Nitro for pain.  Checked lipid panel.  appreciated cardio consult.   Stopped heparin drip.  * Depression Continue Remeron  * Gastric ulcer Continue pantoprazole and sucralfate.  * Nausea Continue Zofran as needed.   All the records are reviewed and case discussed with Care Management/Social Workerr. Management plans discussed with the patient, family and they are in agreement.  CODE STATUS: full.  TOTAL TIME TAKING CARE OF THIS PATIENT: 35 minutes.   POSSIBLE D/C IN 1-2 DAYS, DEPENDING ON CLINICAL CONDITION.   Altamese Dilling M.D on 01/21/2016   Between 7am to 6pm - Pager - [phone]  After 6pm go to [email or web address] - password [email or web address]  [place] [facility]  Office  [phone]  CC: Primary care physician; Rafael Bihari, MD  Note: This dictation was prepared with Dragon dictation along with smaller phrase technology. Any transcriptional errors that result from this process are unintentional.

## 2016-01-21 NOTE — Progress Notes (Signed)
ANTICOAGULATION CONSULT NOTE - Follow Up Consult  Pharmacy Consult for Heparin Drip Indication: chest pain/ACS  Allergies  Allergen Reactions  . Phenobarbital Rash  . Sulfa Antibiotics Rash    Patient Measurements: Height:  (157.5 cm) Weight: 121 lb (54.885 kg) IBW/kg (Calculated) : 50.1 Heparin Dosing Weight: 54 kg  Vital Signs: Temp: 98.1 F (36.7 C) (02/08 0828) Temp Source: Oral (02/08 0828) BP: 106/62 mmHg (02/08 0828) Pulse Rate: 70 (02/08 0828)  Labs:  Recent Labs  01/20/16 1628 01/20/16 2206 01/21/16 0225 01/21/16 1000  HGB 12.0  --  10.4*  --   HCT 36.2  --  30.9*  --   PLT 295  --  236  --   APTT 34  --   --   --   LABPROT 13.6  --   --   --   INR 1.02  --   --   --   HEPARINUNFRC  --   --  <0.10* <0.10*  CREATININE 0.79  --  0.62  --   TROPONINI 0.47* 0.18* 0.05*  --     Estimated Creatinine Clearance: 72.5 mL/min (by C-G formula based on Cr of 0.62).   Medical History: Past Medical History  Diagnosis Date  . Anxiety   . Depression   . Iron deficiency anemia   . Thrombocytopenia (HCC)   . Gastric ulcer     Medications:  Scheduled:  . aspirin EC  325 mg Oral Daily  . azithromycin  250 mg Intravenous Q24H  . cefTRIAXone (ROCEPHIN)  IV  1 g Intravenous Q24H  . heparin  1,600 Units Intravenous Once  . Influenza vac split quadrivalent PF  0.5 mL Intramuscular Tomorrow-1000  . mirtazapine  15 mg Oral QHS  . multivitamin with minerals  1 tablet Oral Daily  . pantoprazole  40 mg Oral Daily  . potassium chloride  40 mEq Oral BID  . sucralfate  1 g Oral TID WC   Infusions:  . heparin 850 Units/hr (01/21/16 0555)    Assessment: Pharmacy consulted to initiate and titrate heparin drip in a 45 yo female for ACS/STEMI.  No anticoagulants on PTA med list or listed in MD notes.   HL at 1000 <0.10, plts: 236, Hgb: 10.4  Goal of Therapy:  Heparin level 0.3-0.7 units/ml Monitor platelets by anticoagulation protocol: Yes   Plan:  HL  subtherapeutic.  Will order heparin bolus 1600 units IV once then increase drip rate to 1050 units/hr.  Will recheck HL in 6 hours at 1700 to further assess dosing.  CBC ordered with AM labs.    Marsha Gundlach G 01/21/2016,11:02 AM

## 2016-01-21 NOTE — Consult Note (Signed)
Cardiology Consult    Patient ID: Summer Howard MRN: [id number], DOB/AGE: 06/25/1971   Admit date: 01/20/2016 Date of Consult: 01/21/2016  Primary Physician: Rafael Bihari, MD Reason for Consult: NSTEMI Primary Cardiologist: New to [facility] Requesting Provider: Dr. Elisabeth Pigeon   History of Present Illness    Summer Howard is a 45 y.o. female with past medical history of gastric bypass in 2011, chronic anemia, and thrombocytopenia who presented to [facility] on 01/20/2016 for worsening cough and congestion starting on  01/12/2016.   She initially thought she had a viral illness, but her symptoms of dyspnea and productive cough persisted so she went to an Urgent Care for evaluation on 01/17/2016, subsequently being diagnosed with PNA and started on Avelox. She was seen by her PCP yesterday and a CXR was performed which showed worsening PNA. Due to her continued symptoms and failure of outpatient antibiotics, admission was recommended.  While admitted, WBC has been normal. Hgb at 10.4. Platelets at 236. Her cyclic troponin values have been 0.47, 0.18, and 0.05. She was started on IV Heparin due to concern for ACS. In regards to her PNA, she has been started on IV Rocephin and IV Azithromycin.   Cardiology was initially asked to see the patient for Elevated Troponin and concern for NSTEMI.  She reports having pleuritic pain along her left pectoral region and left lower back pain since last week. She reports the pain is worse with deep breathing and coughing. She denies having any chest pain prior to her recent PNA diagnosis. No history of CAD. Did have HTN and HLD prior to her gastric bypass but reports those symptoms have since resolved.  No prior tobacco or alcohol use. Not aware of any family history of CAD.  Past Medical History   Past Medical History  Diagnosis Date  . Anxiety   . Depression   . Iron deficiency anemia   . Thrombocytopenia (HCC)   . Gastric ulcer     Past Surgical History    Procedure Laterality Date  . Cesarean section    . Cholecystectomy    . Appendectomy    . Gastric bypass    . Colon surgery       Allergies  Allergies  Allergen Reactions  . Phenobarbital Rash  . Sulfa Antibiotics Rash    Inpatient Medications    . aspirin EC  325 mg Oral Daily  . azithromycin  250 mg Intravenous Q24H  . cefTRIAXone (ROCEPHIN)  IV  1 g Intravenous Q24H  . Influenza vac split quadrivalent PF  0.5 mL Intramuscular Tomorrow-1000  . mirtazapine  15 mg Oral QHS  . multivitamin with minerals  1 tablet Oral Daily  . pantoprazole  40 mg Oral Daily  . potassium chloride  40 mEq Oral BID  . sucralfate  1 g Oral TID WC    Family History    Family History  Problem Relation Age of Onset  . Breast cancer Mother   . Hypertension Father   . Prostate cancer Father   . Hypertension Brother   . Anxiety disorder Brother     Social History    Social History   Social History  . Marital Status: Widowed    Spouse Name: N/A  . Number of Children: N/A  . Years of Education: N/A   Occupational History  . Not on file.   Social History Main Topics  . Smoking status: Never Smoker   . Smokeless tobacco: Never Used  .  Alcohol Use: No  . Drug Use: No  . Sexual Activity: No   Other Topics Concern  . Not on file   Social History Narrative     Review of Systems    General:  No chills, fever, night sweats or weight changes.  Cardiovascular:  No dyspnea on exertion, edema, orthopnea, palpitations, paroxysmal nocturnal dyspnea. Positive for left-sided chest pain. Dermatological: No rash, lesions/masses Respiratory: Positive for cough and dyspnea. Urologic: No hematuria, dysuria Abdominal:   No nausea, vomiting, diarrhea, bright red blood per rectum, melena, or hematemesis Neurologic:  No visual changes, wkns, changes in mental status. All other systems reviewed and are otherwise negative except as noted above.  Physical Exam    Blood pressure 103/64, pulse  70, temperature 97.5 F (36.4 C), temperature source Oral, resp. rate 18, height 5\' 2"  (1.575 m), weight 121 lb (54.885 kg), SpO2 98 %.  General: Pleasant, Caucasian female appearing in NAD Psych: Normal affect. Neuro: Alert and oriented X 3. Moves all extremities spontaneously. HEENT: Normal  Neck: Supple without bruits or JVD. Lungs:  Resp regular and unlabored, without wheezing or rales. Decreased breath sounds at left base. Heart: RRR no s3, s4, or murmurs. Abdomen: Soft, non-tender, non-distended, BS + x 4.  Extremities: No clubbing, cyanosis or edema. DP/PT/Radials 2+ and equal bilaterally.  Labs    Troponin (Point of Care Test) No results for input(s): TROPIPOC in the last 72 hours.  Recent Labs  01/20/16 1628 01/20/16 2206 01/21/16 0225  TROPONINI 0.47* 0.18* 0.05*   Lab Results  Component Value Date   WBC 6.7 01/21/2016   HGB 10.4* 01/21/2016   HCT 30.9* 01/21/2016   MCV 80.7 01/21/2016   PLT 236 01/21/2016    Recent Labs Lab 01/21/16 0225  NA 136  K 3.1*  CL 102  CO2 26  BUN 6  CREATININE 0.62  CALCIUM 8.1*  GLUCOSE 105*   Lab Results  Component Value Date   CHOL 107 01/20/2016   HDL 42 01/20/2016   LDLCALC 53 01/20/2016   TRIG 61 01/20/2016     Radiology Studies    Dg Chest 2 View: 01/21/2016  CLINICAL DATA:  Pneumonia EXAM: CHEST  2 VIEW COMPARISON:  None. FINDINGS: There is airspace consolidation throughout much of the left lower lobe as well as portions of the lingula. Right lung is clear. Heart size and pulmonary vascularity are normal. No adenopathy. There is a small bone island in the right proximal humerus. IMPRESSION: Extensive pneumonia involving portions of the left lower lobe and lingula. Right lung clear. Cardiac silhouette within normal limits. Followup PA and lateral chest radiographs recommended in 3-4 weeks following trial of antibiotic therapy to ensure resolution and exclude underlying malignancy. Electronically Signed   By: Bretta Bang III M.D.   On: 01/21/2016 07:18    EKG & Cardiac Imaging    EKG: NSR, HR 78, No acute ST or T-wave changes.  Echocardiogram: Pending  Assessment & Plan    1. Elevated Troponin/ Chest Pain with Low Risk for Cardiac Etiology - reports having pleuritic pain along her left chest and lower back. Her pain is worse with deep inspiration and coughing. Denies any chest pain prior to her recent PNA diagnosis. No history of CAD. - cyclic troponin values have been 0.47, 0.18, and 0.05 likely to represent demand ischemia. Her EKG does not show any acute ischemic changes. - Discussed the patient with Dr. Herbie Baltimore and we will discontinue IV Heparin.  - Echocardiogram is pending  to assess LV function and wall motion. - If her chest discomfort persists despite improvement in her PNA, could consider outpatient stress test for ischemic evaluation.  2. CAP - failed outpatient Abx treatment with Avelox. Had worsening dyspnea and productive cough. - currently on IV Rocephin and IV Azithromycin  Signed, Ellsworth Lennox, PA-C 01/21/2016, 11:56 AM Pager: [phone]   I have seen, examined and evaluated the patient this AM along with Ms. Iran Ouch.  After reviewing all the available data and chart,  I agree with her findings, examination as well as impression recommendations.  The patient presents with clear symptoms of pneumonia with what sounds like mostly pleuritic-type chest pain. Cannot exclude the possibility of mild myopericarditis. She does not have a rub on exam. Echocardiogram was just reported as being relatively normal with normal EF and no pericardial effusion.  Elevated troponin in this setting is probably low risk for cardiac etiology probably may be demand ischemia versus mild myopericarditis in the setting of pneumonia.  At this point with no true anginal symptoms, I would stop IV heparin. With normal EF, no clear indication for beta blocker at this time -but if blood pressure  would tolerate, would be reasonable. . No signs of heart failure. She really doesn't have that much the way of cardiac risk factors..  Given the fact she had positive troponin levels, this could be indicative of underlying coronary disease and can be evaluated in the outpatient setting once she has recovered from her pneumonia with a Myoview stress test.. Would consider addition of statin until stress test performed.-   We will monitor her progression and the available to schedule outpatient stress test and follow-up.     Marykay Lex, M.D., M.S.  [facility]  [address] [place], [place] [address] [phone] Fax [phone]

## 2016-01-21 NOTE — Progress Notes (Signed)
Admission skin assessment with Cassie Stewart, RN 

## 2016-01-22 LAB — BASIC METABOLIC PANEL
ANION GAP: 4 — AB (ref 5–15)
BUN: 8 mg/dL (ref 6–20)
CO2: 26 mmol/L (ref 22–32)
Calcium: 8.1 mg/dL — ABNORMAL LOW (ref 8.9–10.3)
Chloride: 107 mmol/L (ref 101–111)
Creatinine, Ser: 0.55 mg/dL (ref 0.44–1.00)
GFR calc Af Amer: >60 mL/min (ref >60)
GFR calc non Af Amer: >60 mL/min (ref >60)
GLUCOSE: 97 mg/dL (ref 65–99)
POTASSIUM: 3.8 mmol/L (ref 3.5–5.1)
Sodium: 137 mmol/L (ref 135–145)

## 2016-01-22 LAB — CBC
HEMATOCRIT: 29 % — AB (ref 35.0–47.0)
Hemoglobin: 9.7 g/dL — ABNORMAL LOW (ref 12.0–16.0)
MCH: 27.2 pg (ref 26.0–34.0)
MCHC: 33.3 g/dL (ref 32.0–36.0)
MCV: 81.6 fL (ref 80.0–100.0)
Platelets: 226 10*3/uL (ref 150–440)
RBC: 3.56 MIL/uL — AB (ref 3.80–5.20)
RDW: 14.3 % (ref 11.5–14.5)
WBC: 6.1 10*3/uL (ref 3.6–11.0)

## 2016-01-22 LAB — MAGNESIUM: Magnesium: 2 mg/dL (ref 1.7–2.4)

## 2016-01-22 MED ORDER — OXYCODONE-ACETAMINOPHEN 5-325 MG PO TABS
1.0000 | ORAL_TABLET | ORAL | Status: DC | PRN
  Administered 2016-01-22 – 2016-01-23 (×7): 1 via ORAL
  Filled 2016-01-22 (×7): qty 1

## 2016-01-22 MED ORDER — AMOXICILLIN-POT CLAVULANATE 875-125 MG PO TABS
1.0000 | ORAL_TABLET | Freq: Two times a day (BID) | ORAL | Status: DC
  Administered 2016-01-22 – 2016-01-23 (×3): 1 via ORAL
  Filled 2016-01-22 (×3): qty 1

## 2016-01-22 MED ORDER — ATORVASTATIN CALCIUM 20 MG PO TABS
40.0000 mg | ORAL_TABLET | Freq: Every day | ORAL | Status: DC
  Administered 2016-01-22: 40 mg via ORAL
  Filled 2016-01-22: qty 2

## 2016-01-22 MED ORDER — SUCRALFATE 1 GM/10ML PO SUSP
1.0000 g | Freq: Four times a day (QID) | ORAL | Status: DC
  Administered 2016-01-22 – 2016-01-23 (×2): 1 g via ORAL
  Filled 2016-01-22 (×7): qty 10

## 2016-01-22 NOTE — Progress Notes (Signed)
Pt complaining of on-going pain at left lower chest/upper abdomen, requesting heating pad. Dr. Winona Legato notified. Order for k-pad as needed and 1g carafate PO 4 times a day.

## 2016-01-22 NOTE — Progress Notes (Signed)
[facility] - [place] at [facility]   PATIENT NAME: Summer Howard    MR#:  [id number]  DATE OF BIRTH:  01-15-1971  SUBJECTIVE:  CHIEF COMPLAINT:   Chief Complaint  Patient presents with  . Cough   Still has chest pain with cough and deep breath.    REVIEW OF SYSTEMS:  CONSTITUTIONAL: No fever, fatigue or weakness.  EYES: No blurred or double vision.  EARS, NOSE, AND THROAT: No tinnitus or ear pain.  RESPIRATORY: positive for cough, no shortness of breath, wheezing , some hemoptysis.  CARDIOVASCULAR: with cough- she have left lower chest pain, no orthopnea, edema.  GASTROINTESTINAL: No nausea, vomiting, diarrhea or abdominal pain.  GENITOURINARY: No dysuria, hematuria.  ENDOCRINE: No polyuria, nocturia,  HEMATOLOGY: No anemia, easy bruising or bleeding SKIN: No rash or lesion. MUSCULOSKELETAL: No joint pain or arthritis.  NEUROLOGIC: No tingling, numbness, weakness.  PSYCHIATRY: No anxiety or depression.   ROS  DRUG ALLERGIES:   Allergies  Allergen Reactions  . Phenobarbital Rash  . Sulfa Antibiotics Rash    VITALS:  Blood pressure 124/72, pulse 74, temperature 98.5 F (36.9 C), temperature source Oral, resp. rate 18, height  (1.575 m), weight 54.885 kg (121 lb), SpO2 100 %.  PHYSICAL EXAMINATION:   GENERAL: 45 y.o.-year-old patient lying in the bed with no acute distress.  EYES: Pupils equal, round, reactive to light and accommodation. No scleral icterus. Extraocular muscles intact.  HEENT: Head atraumatic, normocephalic. Oropharynx and nasopharynx clear.  NECK: Supple, no jugular venous distention. No thyroid enlargement, no tenderness.  LUNGS: Diminished breath sounds on left lower side, no wheezing, some crepitation. No use of accessory muscles of respiration.  CARDIOVASCULAR: S1, S2 normal. No murmurs, rubs, or gallops.  ABDOMEN: Soft, nontender, nondistended. Bowel sounds present. No organomegaly or mass.   EXTREMITIES: No pedal edema, cyanosis, or clubbing.  NEUROLOGIC: Cranial nerves II through XII are intact. Muscle strength 5/5 in all extremities. Sensation intact. Gait not checked.  PSYCHIATRIC: The patient is alert and oriented x 3.  SKIN: No obvious rash, lesion, or ulcer.    LABORATORY PANEL:   CBC  Recent Labs Lab 01/22/16 0450  WBC 6.1  HGB 9.7*  HCT 29.0*  PLT 226   ------------------------------------------------------------------------------------------------------------------  Chemistries   Recent Labs Lab 01/22/16 0450  NA 137  K 3.8  CL 107  CO2 26  GLUCOSE 97  BUN 8  CREATININE 0.55  CALCIUM 8.1*  MG 2.0   ------------------------------------------------------------------------------------------------------------------  Cardiac Enzymes  Recent Labs Lab 01/20/16 2206 01/21/16 0225  TROPONINI 0.18* 0.05*   ------------------------------------------------------------------------------------------------------------------  RADIOLOGY:  Dg Chest 2 View  01/21/2016  CLINICAL DATA:  Pneumonia EXAM: CHEST  2 VIEW COMPARISON:  None. FINDINGS: There is airspace consolidation throughout much of the left lower lobe as well as portions of the lingula. Right lung is clear. Heart size and pulmonary vascularity are normal. No adenopathy. There is a small bone island in the right proximal humerus. IMPRESSION: Extensive pneumonia involving portions of the left lower lobe and lingula. Right lung clear. Cardiac silhouette within normal limits. Followup PA and lateral chest radiographs recommended in 3-4 weeks following trial of antibiotic therapy to ensure resolution and exclude underlying malignancy. Electronically Signed   By: Bretta Bang III M.D.   On: 01/21/2016 07:18    ASSESSMENT AND PLAN:   Principal Problem:   Pneumonia Active Problems:   Pleuritic chest pain   Chest pain with low risk for cardiac etiology  * Pneumonia  discontinue IV Rocephin  and azithromycin, start augmentin. Negative MRSA screening from nasal swab,  Sputum culture: Consistent with normal respiratory flora..  * pleuritic chest pain Likely due to pneumonia,  On norco and ibuprofen. Change to Percocet when necessary since the patient said norco didn't worked.  * NSTEMI- suspected but ruled out.  Troponin reported 0.4> 0.1  she was on heparin drip, which was discontinued per cardiology consult. Given asa and Nitro for pain.   * Depression Continue Remeron  * Gastric ulcer Continue pantoprazole and sucralfate.  * Nausea Continue Zofran as needed.   All the records are reviewed and case discussed with Care Management/Social Workerr. Management plans discussed with the patient, family and they are in agreement.  CODE STATUS: full.  TOTAL TIME TAKING CARE OF THIS PATIENT: 35 minutes.   POSSIBLE D/C IN 1 DAYS, DEPENDING ON CLINICAL CONDITION.   Shaune Pollack M.D on 01/22/2016   Between 7am to 6pm - Pager - [phone]  After 6pm go to [email or web address] - password [email or web address]  [place]  Office  [phone]  CC: Primary care physician; Rafael Bihari, MD  Note: This dictation was prepared with Dragon dictation along with smaller phrase technology. Any transcriptional errors that result from this process are unintentional.

## 2016-01-22 NOTE — Progress Notes (Signed)
Patient states the norco we are giving her is not working at all. According to night shift patient slept between doses. Patient states her pain is still a 10/10 and she needs something different for pain medication and she would like for me to talk to the doctor now about it. Dr. Imogene Burn on floor and made aware. No new orders at this time.

## 2016-01-23 MED ORDER — OXYCODONE-ACETAMINOPHEN 5-325 MG PO TABS: 1.0000 | ORAL_TABLET | Freq: Three times a day (TID) | ORAL | Status: DC | PRN

## 2016-01-23 MED ORDER — ASPIRIN EC 81 MG PO TBEC: 81.0000 mg | DELAYED_RELEASE_TABLET | Freq: Every day | ORAL | Status: DC

## 2016-01-23 MED ORDER — BENZONATATE 100 MG PO CAPS: 100.0000 mg | ORAL_CAPSULE | Freq: Three times a day (TID) | ORAL | Status: DC | PRN

## 2016-01-23 MED ORDER — MIRTAZAPINE 15 MG PO TABS: 15.0000 mg | ORAL_TABLET | Freq: Every day | ORAL | Status: DC

## 2016-01-23 MED ORDER — AMOXICILLIN-POT CLAVULANATE 875-125 MG PO TABS: 1.0000 | ORAL_TABLET | Freq: Two times a day (BID) | ORAL | Status: DC

## 2016-01-23 NOTE — Progress Notes (Addendum)
CSW was informed by RN that patient needed a ride home. CSW contacted patient's step-mother Okey Regal [phone]). Per Mrs. Story patient's friend will pick her up at 4:30 PM. She reports that if anyone needs any additional information or coordination of transportation she can be reached at [phone].   CSW provided patient with [facility] transportation information for future reference and encouraged her to sign up for the service. CSW also provided patient with [facility] [employer] information. CSW is signing off. CSW will be available if a CSW need were to arise.   Woodroe Mode, MSW, LCSW-A Clinical Social Work Department [phone]

## 2016-01-23 NOTE — Progress Notes (Signed)
Pt is a&o, NSR on tele, VSS with PRN meds given for pain. Orders to d/c pt to home. Discharge instructions and prescriptions given to pt with verbal acknowledgment of understanding. IV and tele removed and pt awaiting ride for d/c. Nursing will continue to assess.

## 2016-01-23 NOTE — Progress Notes (Signed)
[facility] - [place] at [facility]        Summer Howard was admitted to the Hospital on 01/20/2016 and Discharged  01/23/2016 and should be excused from work/school   for 10 days starting 01/20/2016 , may return to work/school without any restrictions.  Shaune Pollack MD.  Shaune Pollack M.D on 01/23/2016,at 3:42 PM  [facility] - [place] at [facility]  [phone]

## 2016-01-23 NOTE — Discharge Summary (Addendum)
[facility] [place] at [facility]   PATIENT NAME: Summer Howard    MR#:  [id number]  DATE OF BIRTH:  1971/10/07  DATE OF ADMISSION:  01/20/2016 ADMITTING PHYSICIAN: Altamese Dilling, MD  DATE OF DISCHARGE: 01/23/2016  PRIMARY CARE PHYSICIAN: Rafael Bihari, MD    ADMISSION DIAGNOSIS:  Pneumonia [J18.9] CAP (community acquired pneumonia) [J18.9]   DISCHARGE DIAGNOSIS:  Pneumonia (LLL) with pleuritic chest pain  SECONDARY DIAGNOSIS:   Past Medical History  Diagnosis Date  . Anxiety   . Depression   . Iron deficiency anemia   . Thrombocytopenia (HCC)   . Gastric ulcer     HOSPITAL COURSE:   * Pneumonia (LLL) She was treated wtih IV Rocephin and azithromycin, changed to augmentin. Negative MRSA screening from nasal swab, Sputum culture: Consistent with normal respiratory flora..  * pleuritic chest pain Likely due to pneumonia, On norco and ibuprofen. Changed to Percocet when necessary since the patient said norco didn't worked.  * NSTEMI- suspected but ruled out.  Troponin reported 0.4> 0.1  she was on heparin drip, which was discontinued per cardiology consult. Given asa and Nitro for pain.   * Depression Continue Remeron  * Gastric ulcer Continue pantoprazole and sucralfate.  * Nausea Treated with Zofran as needed.  DISCHARGE CONDITIONS:   Stable, discharge to home today.  CONSULTS OBTAINED:     DRUG ALLERGIES:   Allergies  Allergen Reactions  . Phenobarbital Rash  . Sulfa Antibiotics Rash    DISCHARGE MEDICATIONS:   Current Discharge Medication List    START taking these medications   Details  amoxicillin-clavulanate (AUGMENTIN) 875-125 MG tablet Take 1 tablet by mouth every 12 (twelve) hours. Qty: 14 tablet, Refills: 0    aspirin EC 81 MG tablet Take 1 tablet (81 mg total) by mouth daily. Qty: 30 tablet, Refills: 0    benzonatate (TESSALON PERLES) 100 MG capsule Take 1 capsule (100 mg total) by  mouth 3 (three) times daily as needed for cough. Qty: 20 capsule, Refills: 0    oxyCODONE-acetaminophen (PERCOCET/ROXICET) 5-325 MG tablet Take 1 tablet by mouth every 8 (eight) hours as needed for moderate pain or severe pain. Qty: 12 tablet, Refills: 0      CONTINUE these medications which have CHANGED   Details  mirtazapine (REMERON) 15 MG tablet Take 1 tablet (15 mg total) by mouth at bedtime. Qty: 30 tablet, Refills: 1      CONTINUE these medications which have NOT CHANGED   Details  acetaminophen (TYLENOL) 500 MG tablet Take 1,000 mg by mouth every 6 (six) hours as needed for mild pain, fever or headache.    calcium carbonate (TUMS - DOSED IN MG ELEMENTAL CALCIUM) 500 MG chewable tablet Chew 2 tablets by mouth as needed for indigestion or heartburn.    Multiple Vitamin (MULTIVITAMIN WITH MINERALS) TABS tablet Take 1 tablet by mouth daily.    ondansetron (ZOFRAN) 4 MG tablet Take 4 mg by mouth every 8 (eight) hours as needed for nausea or vomiting.     pantoprazole (PROTONIX) 40 MG tablet Take 40 mg by mouth daily.     sucralfate (CARAFATE) 1 GM/10ML suspension Take 1 g by mouth 3 (three) times daily with meals.         DISCHARGE INSTRUCTIONS:   If you experience worsening of your admission symptoms, develop shortness of breath, life threatening emergency, suicidal or homicidal thoughts you must seek medical attention immediately by calling 911 or calling your MD immediately  if symptoms less severe.  You Must read complete instructions/literature along with all the possible adverse reactions/side effects for all the Medicines you take and that have been prescribed to you. Take any new Medicines after you have completely understood and accept all the possible adverse reactions/side effects.   Please note  You were cared for by a hospitalist during your hospital stay. If you have any questions about your discharge medications or the care you received while you were in the  hospital after you are discharged, you can call the unit and asked to speak with the hospitalist on call if the hospitalist that took care of you is not available. Once you are discharged, your primary care physician will handle any further medical issues. Please note that NO REFILLS for any discharge medications will be authorized once you are discharged, as it is imperative that you return to your primary care physician (or establish a relationship with a primary care physician if you do not have one) for your aftercare needs so that they can reassess your need for medications and monitor your lab values.    Today   SUBJECTIVE   Left-sided chest pain is much better.   VITAL SIGNS:  Blood pressure 118/66, pulse 94, temperature 98.9 F (37.2 C), temperature source Oral, resp. rate 16, height 5\' 2"  (1.575 m), weight 54.885 kg (121 lb), SpO2 97 %.  I/O:   Intake/Output Summary (Last 24 hours) at 01/23/16 1544 Last data filed at 01/23/16 1130  Gross per 24 hour  Intake      0 ml  Output   2800 ml  Net  -2800 ml    PHYSICAL EXAMINATION:  GENERAL:  45 y.o.-year-old patient lying in the bed with no acute distress.  EYES: Pupils equal, round, reactive to light and accommodation. No scleral icterus. Extraocular muscles intact.  HEENT: Head atraumatic, normocephalic. Oropharynx and nasopharynx clear.  NECK:  Supple, no jugular venous distention. No thyroid enlargement, no tenderness.  LUNGS: Normal breath sounds bilaterally, no wheezing, rales,rhonchi or crepitation. No use of accessory muscles of respiration.  CARDIOVASCULAR: S1, S2 normal. No murmurs, rubs, or gallops.  ABDOMEN: Soft, non-tender, non-distended. Bowel sounds present. No organomegaly or mass.  EXTREMITIES: No pedal edema, cyanosis, or clubbing.  NEUROLOGIC: Cranial nerves II through XII are intact. Muscle strength 5/5 in all extremities. Sensation intact. Gait not checked.  PSYCHIATRIC: The patient is alert and oriented x  3.  SKIN: No obvious rash, lesion, or ulcer.   DATA REVIEW:   CBC  Recent Labs Lab 01/22/16 0450  WBC 6.1  HGB 9.7*  HCT 29.0*  PLT 226    Chemistries   Recent Labs Lab 01/22/16 0450  NA 137  K 3.8  CL 107  CO2 26  GLUCOSE 97  BUN 8  CREATININE 0.55  CALCIUM 8.1*  MG 2.0    Cardiac Enzymes  Recent Labs Lab 01/21/16 0225  TROPONINI 0.05*    Microbiology Results  Results for orders placed or performed during the hospital encounter of 01/20/16  Culture, expectorated sputum-assessment     Status: None   Collection Time: 01/20/16  8:00 PM  Result Value Ref Range Status   Specimen Description SPUTUM  Final   Special Requests Normal  Final   Sputum evaluation THIS SPECIMEN IS ACCEPTABLE FOR SPUTUM CULTURE  Final   Report Status 01/21/2016 FINAL  Final  Culture, respiratory (NON-Expectorated)     Status: None (Preliminary result)   Collection Time: 01/20/16  8:00 PM  Result Value Ref Range Status   Specimen Description SPUTUM  Final   Special Requests Normal Reflexed from [id number]  Final   Culture Consistent with normal respiratory flora.  Final   Report Status PENDING  Incomplete  MRSA PCR Screening     Status: None   Collection Time: 01/20/16  9:32 PM  Result Value Ref Range Status   MRSA by PCR NEGATIVE NEGATIVE Final    Comment:        The GeneXpert MRSA Assay (FDA approved for NASAL specimens only), is one component of a comprehensive MRSA colonization surveillance program. It is not intended to diagnose MRSA infection nor to guide or monitor treatment for MRSA infections.     RADIOLOGY:  No results found.      Management plans discussed with the patient, family and they are in agreement.  CODE STATUS:     Code Status Orders        Start     Ordered   01/20/16 2049  Full code   Continuous     01/20/16 2048    Code Status History    Date Active Date Inactive Code Status Order ID Comments User Context   This patient has a current  code status but no historical code status.      TOTAL TIME TAKING CARE OF THIS PATIENT:  35 minutes.    Shaune Pollack M.D on 01/23/2016 at 3:44 PM  Between 7am to 6pm - Pager - [phone]  After 6pm go to [email or web address] - password [email or web address]  [place] [facility]  Office  [phone]  CC: Primary care physician; Rafael Bihari, MD

## 2016-01-23 NOTE — Discharge Instructions (Signed)
Heart healthy diet. °Activity as tolerated. °

## 2016-01-24 LAB — CULTURE, RESPIRATORY W GRAM STAIN: Culture: NORMAL

## 2016-01-24 LAB — CULTURE, RESPIRATORY: SPECIAL REQUESTS: NORMAL

## 2016-01-29 ENCOUNTER — Inpatient Hospital Stay: Payer: Medicaid Other

## 2016-01-30 ENCOUNTER — Inpatient Hospital Stay: Payer: Medicaid Other

## 2016-01-30 ENCOUNTER — Other Ambulatory Visit: Payer: Self-pay | Admitting: Internal Medicine

## 2016-01-30 ENCOUNTER — Inpatient Hospital Stay: Payer: Medicaid Other | Attending: Internal Medicine | Admitting: Internal Medicine

## 2016-01-30 VITALS — BP 128/76 | HR 97 | Temp 100.0°F | Resp 18

## 2016-01-30 VITALS — BP 131/88 | HR 79 | Temp 98.1°F | Ht 62.8 in | Wt 121.6 lb

## 2016-01-30 DIAGNOSIS — R5383 Other fatigue: Secondary | ICD-10-CM | POA: Insufficient documentation

## 2016-01-30 DIAGNOSIS — Z7982 Long term (current) use of aspirin: Secondary | ICD-10-CM | POA: Insufficient documentation

## 2016-01-30 DIAGNOSIS — Z9884 Bariatric surgery status: Secondary | ICD-10-CM | POA: Insufficient documentation

## 2016-01-30 DIAGNOSIS — D508 Other iron deficiency anemias: Secondary | ICD-10-CM

## 2016-01-30 DIAGNOSIS — Z79899 Other long term (current) drug therapy: Secondary | ICD-10-CM | POA: Insufficient documentation

## 2016-01-30 DIAGNOSIS — D696 Thrombocytopenia, unspecified: Secondary | ICD-10-CM

## 2016-01-30 DIAGNOSIS — Z8701 Personal history of pneumonia (recurrent): Secondary | ICD-10-CM | POA: Insufficient documentation

## 2016-01-30 DIAGNOSIS — D509 Iron deficiency anemia, unspecified: Secondary | ICD-10-CM

## 2016-01-30 DIAGNOSIS — F418 Other specified anxiety disorders: Secondary | ICD-10-CM | POA: Insufficient documentation

## 2016-01-30 DIAGNOSIS — J851 Abscess of lung with pneumonia: Secondary | ICD-10-CM

## 2016-01-30 DIAGNOSIS — Z808 Family history of malignant neoplasm of other organs or systems: Secondary | ICD-10-CM | POA: Diagnosis not present

## 2016-01-30 DIAGNOSIS — Z8711 Personal history of peptic ulcer disease: Secondary | ICD-10-CM

## 2016-01-30 DIAGNOSIS — Z803 Family history of malignant neoplasm of breast: Secondary | ICD-10-CM

## 2016-01-30 LAB — IRON AND TIBC
IRON: 16 ug/dL — AB (ref 28–170)
SATURATION RATIOS: 8 % — AB (ref 10.4–31.8)
TIBC: 210 ug/dL — AB (ref 250–450)
UIBC: 194 ug/dL

## 2016-01-30 LAB — CBC WITH DIFFERENTIAL/PLATELET
BASOS ABS: 0.1 10*3/uL (ref 0–0.1)
Basophils Relative: 1 %
EOS PCT: 1 %
Eosinophils Absolute: 0.1 10*3/uL (ref 0–0.7)
HCT: 30.3 % — ABNORMAL LOW (ref 35.0–47.0)
HEMOGLOBIN: 10.2 g/dL — AB (ref 12.0–16.0)
LYMPHS ABS: 1.1 10*3/uL (ref 1.0–3.6)
LYMPHS PCT: 9 %
MCH: 27.7 pg (ref 26.0–34.0)
MCHC: 33.6 g/dL (ref 32.0–36.0)
MCV: 82.4 fL (ref 80.0–100.0)
Monocytes Absolute: 0.5 10*3/uL (ref 0.2–0.9)
Monocytes Relative: 4 %
NEUTROS ABS: 10.6 10*3/uL — AB (ref 1.4–6.5)
NEUTROS PCT: 85 %
PLATELETS: 835 10*3/uL — AB (ref 150–440)
RBC: 3.68 MIL/uL — AB (ref 3.80–5.20)
RDW: 14.3 % (ref 11.5–14.5)
WBC: 12.3 10*3/uL — AB (ref 3.6–11.0)

## 2016-01-30 LAB — FERRITIN: Ferritin: 274 ng/mL (ref 11–307)

## 2016-01-30 MED ORDER — SODIUM CHLORIDE 0.9 % IV SOLN
Freq: Once | INTRAVENOUS | Status: AC
  Administered 2016-01-30: 15:00:00 via INTRAVENOUS
  Filled 2016-01-30: qty 1000

## 2016-01-30 MED ORDER — SODIUM CHLORIDE 0.9 % IV SOLN
200.0000 mg | Freq: Once | INTRAVENOUS | Status: AC
  Administered 2016-01-30: 200 mg via INTRAVENOUS
  Filled 2016-01-30: qty 10

## 2016-01-30 NOTE — Progress Notes (Signed)
[facility] OFFICE PROGRESS NOTE  Patient Care Team: Rafael Bihari, MD as PCP - General (Internal Medicine)   SUMMARY OF HEMATOLOGIC HISTORY:  # Iron deficiency Anemia-sec to gastric bypass [Nov 2011] on IV iron.   INTERVAL HISTORY:  This is my first interaction with the patient since I joined the practice September 2016. I reviewed the patient's prior charts/pertinent labs/ in detail; findings are summarized above.   In the interim patient was admitted to the hospital for pneumonia; she is status post antibiotics. She is coughing; fevers and improved. She is awaiting to have a CT scan in the next few days.  She complains of significant fatigue. Denies any blood in stools black stools. She does not take B12 pills. Patient last Venofer was approximately 4 months ago.   REVIEW OF SYSTEMS:  A complete 10 point review of system is done which is negative except mentioned above/history of present illness.   PAST MEDICAL HISTORY :  Past Medical History  Diagnosis Date  . Anxiety   . Depression   . Iron deficiency anemia   . Thrombocytopenia (HCC)   . Gastric ulcer     PAST SURGICAL HISTORY :   Past Surgical History  Procedure Laterality Date  . Cesarean section    . Cholecystectomy    . Appendectomy    . Gastric bypass  2011  . Colon surgery      FAMILY HISTORY :   Family History  Problem Relation Age of Onset  . Breast cancer Mother   . Hypertension Father   . Prostate cancer Father   . Hypertension Brother   . Anxiety disorder Brother     SOCIAL HISTORY:   Social History  Substance Use Topics  . Smoking status: Never Smoker   . Smokeless tobacco: Never Used  . Alcohol Use: No    ALLERGIES:  is allergic to phenobarbital and sulfa antibiotics.  MEDICATIONS:  Current Outpatient Prescriptions  Medication Sig Dispense Refill  . acetaminophen (TYLENOL) 500 MG tablet Take 1,000 mg by mouth every 6 (six) hours as needed for mild pain, fever or  headache.    Marland Kitchen amoxicillin-clavulanate (AUGMENTIN) 875-125 MG tablet Take 1 tablet by mouth every 12 (twelve) hours. 14 tablet 0  . aspirin EC 81 MG tablet Take 1 tablet (81 mg total) by mouth daily. 30 tablet 0  . benzonatate (TESSALON PERLES) 100 MG capsule Take 1 capsule (100 mg total) by mouth 3 (three) times daily as needed for cough. 20 capsule 0  . calcium carbonate (TUMS - DOSED IN MG ELEMENTAL CALCIUM) 500 MG chewable tablet Chew 2 tablets by mouth as needed for indigestion or heartburn.    . mirtazapine (REMERON) 15 MG tablet Take 1 tablet (15 mg total) by mouth at bedtime. 30 tablet 1  . Multiple Vitamin (MULTIVITAMIN WITH MINERALS) TABS tablet Take 1 tablet by mouth daily.    . ondansetron (ZOFRAN) 4 MG tablet Take 4 mg by mouth every 8 (eight) hours as needed for nausea or vomiting.     . pantoprazole (PROTONIX) 40 MG tablet Take 40 mg by mouth daily.     . sucralfate (CARAFATE) 1 GM/10ML suspension Take 1 g by mouth 3 (three) times daily with meals.    Marland Kitchen oxyCODONE-acetaminophen (PERCOCET/ROXICET) 5-325 MG tablet Take 1 tablet by mouth every 8 (eight) hours as needed for moderate pain or severe pain. (Patient not taking: Reported on 01/30/2016) 12 tablet 0   No current facility-administered medications for this  visit.    PHYSICAL EXAMINATION:   BP 131/88 mmHg  Pulse 79  Temp(Src) 98.1 F (36.7 C)  Ht 5' 2.8" (1.595 m)  Wt 121 lb 9.3 oz (55.15 kg)  BMI 21.68 kg/m2  SpO2 96%  Filed Weights   01/30/16 1416  Weight: 121 lb 9.3 oz (55.15 kg)    GENERAL: moderately nourished moderately built Caucasian female patient. She is alone. EYES: no pallor or icterus OROPHARYNX: no thrush or ulceration; good dentition  NECK: supple, no masses felt LYMPH:  no palpable lymphadenopathy in the cervical, axillary or inguinal regions LUNGS: clear to auscultation and  No wheeze or crackles HEART/CVS: regular rate & rhythm and no murmurs; No lower extremity edema ABDOMEN:abdomen soft,  non-tender and normal bowel sounds Musculoskeletal:no cyanosis of digits and no clubbing  PSYCH: alert & oriented x 3 with fluent speech NEURO: no focal motor/sensory deficits SKIN:  no rashes or significant lesions  LABORATORY DATA:  I have reviewed the data as listed    Component Value Date/Time   NA 137 01/22/2016 0450   NA 140 12/03/2014 1459   K 3.8 01/22/2016 0450   K 3.7 12/03/2014 1459   CL 107 01/22/2016 0450   CL 105 12/03/2014 1459   CO2 26 01/22/2016 0450   CO2 24 12/03/2014 1459   GLUCOSE 97 01/22/2016 0450   GLUCOSE 85 12/03/2014 1459   BUN 8 01/22/2016 0450   BUN 2* 12/03/2014 1459   CREATININE 0.55 01/22/2016 0450   CREATININE 0.74 12/03/2014 1459   CALCIUM 8.1* 01/22/2016 0450   CALCIUM 7.9* 12/03/2014 1459   PROT 5.9* 12/03/2014 1459   ALBUMIN 2.7* 12/03/2014 1459   AST 29 12/03/2014 1459   ALT 22 12/03/2014 1459   ALKPHOS 91 12/03/2014 1459   BILITOT 0.2 12/03/2014 1459   GFRNONAA >60 01/22/2016 0450   GFRNONAA >60 12/03/2014 1459   GFRAA >60 01/22/2016 0450   GFRAA >60 12/03/2014 1459    No results found for: SPEP, UPEP  Lab Results  Component Value Date   WBC 12.3* 01/30/2016   NEUTROABS 10.6* 01/30/2016   HGB 10.2* 01/30/2016   HCT 30.3* 01/30/2016   MCV 82.4 01/30/2016   PLT 835* 01/30/2016      Chemistry      Component Value Date/Time   NA 137 01/22/2016 0450   NA 140 12/03/2014 1459   K 3.8 01/22/2016 0450   K 3.7 12/03/2014 1459   CL 107 01/22/2016 0450   CL 105 12/03/2014 1459   CO2 26 01/22/2016 0450   CO2 24 12/03/2014 1459   BUN 8 01/22/2016 0450   BUN 2* 12/03/2014 1459   CREATININE 0.55 01/22/2016 0450   CREATININE 0.74 12/03/2014 1459      Component Value Date/Time   CALCIUM 8.1* 01/22/2016 0450   CALCIUM 7.9* 12/03/2014 1459   ALKPHOS 91 12/03/2014 1459   AST 29 12/03/2014 1459   ALT 22 12/03/2014 1459   BILITOT 0.2 12/03/2014 1459       RADIOGRAPHIC STUDIES: I have personally reviewed the radiological  images as listed and agreed with the findings in the report. No results found.   ASSESSMENT & PLAN: Iron defieicny Anemia- secondary gastric bypass. Saturations are 8/low. Hemoglobin around 10. Patient's symptomatic. Recommend IV iron. Ferritin- is high/likely from acute phase reactant. I also recommend she takes sublingual B12.  # check CBC CMP and LDH and reticulocyte count B12; soluble transferrin receptor- to be done approximately 1 week prior to next visit in 4 months.  #  cough recent pneumonia- workup for PCP. She is awaiting a CAT scan.  # patient follow-up with me in approximately 4 months/Venofer.       Earna Coder, MD 01/30/2016 2:26 PM

## 2016-01-30 NOTE — Progress Notes (Signed)
Patient brought to exam room 6, patient ambulates independently without assistance.  Medication Rec updated, information provided by patient.

## 2016-02-02 ENCOUNTER — Other Ambulatory Visit: Payer: Self-pay | Admitting: Internal Medicine

## 2016-02-02 DIAGNOSIS — J851 Abscess of lung with pneumonia: Secondary | ICD-10-CM

## 2016-02-03 ENCOUNTER — Ambulatory Visit
Admission: RE | Admit: 2016-02-03 | Discharge: 2016-02-03 | Disposition: A | Discharge status: Home / Self Care | Payer: Medicaid Other | Source: Ambulatory Visit | Attending: Internal Medicine | Admitting: Internal Medicine

## 2016-02-03 DIAGNOSIS — J90 Pleural effusion, not elsewhere classified: Secondary | ICD-10-CM | POA: Insufficient documentation

## 2016-02-03 DIAGNOSIS — J851 Abscess of lung with pneumonia: Secondary | ICD-10-CM | POA: Insufficient documentation

## 2016-02-03 DIAGNOSIS — J852 Abscess of lung without pneumonia: Secondary | ICD-10-CM

## 2016-02-03 HISTORY — DX: Abscess of lung without pneumonia: J85.2

## 2016-02-03 MED ORDER — IOHEXOL 300 MG/ML  SOLN
75.0000 mL | Freq: Once | INTRAMUSCULAR | Status: AC | PRN
  Administered 2016-02-03: 75 mL via INTRAVENOUS

## 2016-02-05 ENCOUNTER — Inpatient Hospital Stay
Admission: EM | Admit: 2016-02-05 | Discharge: 2016-02-06 | DRG: 871 | Disposition: A | Discharge status: Other Acute Inpatient Hospital | Payer: Medicaid Other | Attending: Internal Medicine | Admitting: Internal Medicine

## 2016-02-05 ENCOUNTER — Inpatient Hospital Stay: Payer: Medicaid Other

## 2016-02-05 DIAGNOSIS — F329 Major depressive disorder, single episode, unspecified: Secondary | ICD-10-CM | POA: Diagnosis present

## 2016-02-05 DIAGNOSIS — J851 Abscess of lung with pneumonia: Secondary | ICD-10-CM | POA: Diagnosis present

## 2016-02-05 DIAGNOSIS — F419 Anxiety disorder, unspecified: Secondary | ICD-10-CM | POA: Diagnosis present

## 2016-02-05 DIAGNOSIS — J869 Pyothorax without fistula: Secondary | ICD-10-CM

## 2016-02-05 DIAGNOSIS — Z7982 Long term (current) use of aspirin: Secondary | ICD-10-CM | POA: Diagnosis not present

## 2016-02-05 DIAGNOSIS — E876 Hypokalemia: Secondary | ICD-10-CM | POA: Diagnosis present

## 2016-02-05 DIAGNOSIS — D509 Iron deficiency anemia, unspecified: Secondary | ICD-10-CM | POA: Diagnosis present

## 2016-02-05 DIAGNOSIS — Z882 Allergy status to sulfonamides status: Secondary | ICD-10-CM

## 2016-02-05 DIAGNOSIS — A419 Sepsis, unspecified organism: Principal | ICD-10-CM | POA: Diagnosis present

## 2016-02-05 DIAGNOSIS — Z79891 Long term (current) use of opiate analgesic: Secondary | ICD-10-CM

## 2016-02-05 DIAGNOSIS — J189 Pneumonia, unspecified organism: Secondary | ICD-10-CM | POA: Diagnosis present

## 2016-02-05 DIAGNOSIS — Z888 Allergy status to other drugs, medicaments and biological substances status: Secondary | ICD-10-CM | POA: Diagnosis not present

## 2016-02-05 DIAGNOSIS — D7589 Other specified diseases of blood and blood-forming organs: Secondary | ICD-10-CM | POA: Diagnosis present

## 2016-02-05 DIAGNOSIS — K219 Gastro-esophageal reflux disease without esophagitis: Secondary | ICD-10-CM | POA: Diagnosis present

## 2016-02-05 DIAGNOSIS — J850 Gangrene and necrosis of lung: Secondary | ICD-10-CM | POA: Diagnosis present

## 2016-02-05 DIAGNOSIS — J918 Pleural effusion in other conditions classified elsewhere: Secondary | ICD-10-CM | POA: Diagnosis present

## 2016-02-05 DIAGNOSIS — Z9884 Bariatric surgery status: Secondary | ICD-10-CM

## 2016-02-05 DIAGNOSIS — Z79899 Other long term (current) drug therapy: Secondary | ICD-10-CM

## 2016-02-05 DIAGNOSIS — J90 Pleural effusion, not elsewhere classified: Secondary | ICD-10-CM

## 2016-02-05 DIAGNOSIS — Z9889 Other specified postprocedural states: Secondary | ICD-10-CM

## 2016-02-05 LAB — CBC
HCT: 28.3 % — ABNORMAL LOW (ref 35.0–47.0)
Hemoglobin: 9.4 g/dL — ABNORMAL LOW (ref 12.0–16.0)
MCH: 27.3 pg (ref 26.0–34.0)
MCHC: 33.4 g/dL (ref 32.0–36.0)
MCV: 81.7 fL (ref 80.0–100.0)
Platelets: 817 10*3/uL — ABNORMAL HIGH (ref 150–440)
RBC: 3.46 MIL/uL — ABNORMAL LOW (ref 3.80–5.20)
RDW: 14.5 % (ref 11.5–14.5)
WBC: 7.4 10*3/uL (ref 3.6–11.0)

## 2016-02-05 LAB — BASIC METABOLIC PANEL
Anion gap: 5 (ref 5–15)
BUN: <5 mg/dL — ABNORMAL LOW (ref 6–20)
CO2: 30 mmol/L (ref 22–32)
Calcium: 8.5 mg/dL — ABNORMAL LOW (ref 8.9–10.3)
Chloride: 105 mmol/L (ref 101–111)
Creatinine, Ser: 0.5 mg/dL (ref 0.44–1.00)
GFR calc Af Amer: >60 mL/min (ref >60)
GFR calc non Af Amer: >60 mL/min (ref >60)
Glucose, Bld: 54 mg/dL — ABNORMAL LOW (ref 65–99)
Potassium: 2.9 mmol/L — CL (ref 3.5–5.1)
Sodium: 140 mmol/L (ref 135–145)

## 2016-02-05 MED ORDER — ACETAMINOPHEN 325 MG PO TABS
650.0000 mg | ORAL_TABLET | Freq: Four times a day (QID) | ORAL | Status: DC | PRN
  Filled 2016-02-05: qty 2

## 2016-02-05 MED ORDER — ONDANSETRON HCL 4 MG PO TABS: 4.0000 mg | ORAL_TABLET | Freq: Three times a day (TID) | ORAL | Status: DC | PRN

## 2016-02-05 MED ORDER — ACETAMINOPHEN 650 MG RE SUPP: 650.0000 mg | Freq: Four times a day (QID) | RECTAL | Status: DC | PRN

## 2016-02-05 MED ORDER — ONDANSETRON HCL 4 MG/2ML IJ SOLN
4.0000 mg | Freq: Once | INTRAMUSCULAR | Status: AC
  Administered 2016-02-05: 4 mg via INTRAVENOUS
  Filled 2016-02-05: qty 2

## 2016-02-05 MED ORDER — POTASSIUM CHLORIDE CRYS ER 20 MEQ PO TBCR
40.0000 meq | EXTENDED_RELEASE_TABLET | Freq: Once | ORAL | Status: AC
  Administered 2016-02-05: 40 meq via ORAL
  Filled 2016-02-05: qty 2

## 2016-02-05 MED ORDER — ONDANSETRON HCL 4 MG/2ML IJ SOLN
4.0000 mg | Freq: Once | INTRAMUSCULAR | Status: AC
  Administered 2016-02-05: 4 mg via INTRAVENOUS

## 2016-02-05 MED ORDER — VANCOMYCIN HCL IN DEXTROSE 750-5 MG/150ML-% IV SOLN
750.0000 mg | Freq: Once | INTRAVENOUS | Status: AC
  Administered 2016-02-05: 750 mg via INTRAVENOUS
  Filled 2016-02-05: qty 150

## 2016-02-05 MED ORDER — POTASSIUM CHLORIDE IN NACL 20-0.9 MEQ/L-% IV SOLN
INTRAVENOUS | Status: DC
  Administered 2016-02-05 – 2016-02-06 (×3): via INTRAVENOUS
  Filled 2016-02-05 (×3): qty 1000

## 2016-02-05 MED ORDER — ONDANSETRON HCL 4 MG/2ML IJ SOLN
4.0000 mg | Freq: Four times a day (QID) | INTRAMUSCULAR | Status: DC | PRN
Start: 2016-02-05 — End: 2016-02-06
  Administered 2016-02-06: 4 mg via INTRAVENOUS
  Filled 2016-02-05: qty 2

## 2016-02-05 MED ORDER — ADULT MULTIVITAMIN W/MINERALS CH
1.0000 | ORAL_TABLET | Freq: Every day | ORAL | Status: DC
  Administered 2016-02-05 – 2016-02-06 (×2): 1 via ORAL
  Filled 2016-02-05 (×2): qty 1

## 2016-02-05 MED ORDER — MORPHINE SULFATE (PF) 2 MG/ML IV SOLN
2.0000 mg | Freq: Once | INTRAVENOUS | Status: AC
  Administered 2016-02-05: 2 mg via INTRAVENOUS

## 2016-02-05 MED ORDER — ENOXAPARIN SODIUM 40 MG/0.4ML ~~LOC~~ SOLN
40.0000 mg | SUBCUTANEOUS | Status: DC
  Administered 2016-02-05 – 2016-02-06 (×2): 40 mg via SUBCUTANEOUS
  Filled 2016-02-05 (×2): qty 0.4

## 2016-02-05 MED ORDER — ONDANSETRON HCL 4 MG/2ML IJ SOLN
INTRAMUSCULAR | Status: AC
  Administered 2016-02-05: 4 mg via INTRAVENOUS
  Filled 2016-02-05: qty 2

## 2016-02-05 MED ORDER — HYDROMORPHONE HCL 1 MG/ML IJ SOLN
0.5000 mg | Freq: Once | INTRAMUSCULAR | Status: AC
  Administered 2016-02-05: 0.5 mg via INTRAVENOUS
  Filled 2016-02-05: qty 1

## 2016-02-05 MED ORDER — VANCOMYCIN HCL IN DEXTROSE 750-5 MG/150ML-% IV SOLN: 750.0000 mg | Freq: Two times a day (BID) | INTRAVENOUS | Status: DC

## 2016-02-05 MED ORDER — MORPHINE SULFATE (PF) 2 MG/ML IV SOLN
INTRAVENOUS | Status: AC
  Administered 2016-02-05: 2 mg via INTRAVENOUS
  Filled 2016-02-05: qty 1

## 2016-02-05 MED ORDER — PIPERACILLIN-TAZOBACTAM 3.375 G IVPB 30 MIN
3.3750 g | Freq: Once | INTRAVENOUS | Status: AC
  Administered 2016-02-05: 3.375 g via INTRAVENOUS
  Filled 2016-02-05: qty 50

## 2016-02-05 MED ORDER — VANCOMYCIN HCL IN DEXTROSE 1-5 GM/200ML-% IV SOLN
1000.0000 mg | Freq: Once | INTRAVENOUS | Status: AC
  Administered 2016-02-05: 1000 mg via INTRAVENOUS
  Filled 2016-02-05: qty 200

## 2016-02-05 MED ORDER — SUCRALFATE 1 GM/10ML PO SUSP
1.0000 g | Freq: Three times a day (TID) | ORAL | Status: DC
  Administered 2016-02-05 – 2016-02-06 (×2): 1 g via ORAL
  Filled 2016-02-05 (×2): qty 10

## 2016-02-05 MED ORDER — VANCOMYCIN HCL IN DEXTROSE 750-5 MG/150ML-% IV SOLN
750.0000 mg | Freq: Two times a day (BID) | INTRAVENOUS | Status: DC
  Administered 2016-02-06: 750 mg via INTRAVENOUS
  Filled 2016-02-05 (×2): qty 150

## 2016-02-05 MED ORDER — ACETAMINOPHEN 500 MG PO TABS
1000.0000 mg | ORAL_TABLET | Freq: Four times a day (QID) | ORAL | Status: DC | PRN
  Administered 2016-02-05: 1000 mg via ORAL

## 2016-02-05 MED ORDER — PANTOPRAZOLE SODIUM 40 MG PO TBEC
40.0000 mg | DELAYED_RELEASE_TABLET | Freq: Every day | ORAL | Status: DC
  Administered 2016-02-05 – 2016-02-06 (×2): 40 mg via ORAL
  Filled 2016-02-05 (×2): qty 1

## 2016-02-05 MED ORDER — ZOLPIDEM TARTRATE 5 MG PO TABS
5.0000 mg | ORAL_TABLET | Freq: Every evening | ORAL | Status: DC | PRN
  Administered 2016-02-05: 5 mg via ORAL
  Filled 2016-02-05: qty 1

## 2016-02-05 MED ORDER — PIPERACILLIN-TAZOBACTAM 3.375 G IVPB
3.3750 g | Freq: Three times a day (TID) | INTRAVENOUS | Status: DC
  Administered 2016-02-05 – 2016-02-06 (×3): 3.375 g via INTRAVENOUS
  Filled 2016-02-05 (×4): qty 50

## 2016-02-05 MED ORDER — ONDANSETRON HCL 4 MG PO TABS: 4.0000 mg | ORAL_TABLET | Freq: Four times a day (QID) | ORAL | Status: DC | PRN

## 2016-02-05 MED ORDER — HYDROMORPHONE HCL 1 MG/ML IJ SOLN
1.0000 mg | INTRAMUSCULAR | Status: DC | PRN
  Administered 2016-02-05 – 2016-02-06 (×7): 1 mg via SUBCUTANEOUS
  Filled 2016-02-05 (×7): qty 1

## 2016-02-05 MED ORDER — ASPIRIN EC 81 MG PO TBEC
81.0000 mg | DELAYED_RELEASE_TABLET | Freq: Every day | ORAL | Status: DC
  Administered 2016-02-05 – 2016-02-06 (×2): 81 mg via ORAL
  Filled 2016-02-05 (×2): qty 1

## 2016-02-05 NOTE — H&P (Signed)
[facility] - [place] [facility]   PATIENT NAME: Summer Howard    MR#:  [id number]  DATE OF BIRTH:  1970-12-17  DATE OF ADMISSION:  02/05/2016  PRIMARY CARE PHYSICIAN: Rafael Bihari, MD   REQUESTING/REFERRING PHYSICIAN: Dr. Governor Rooks  CHIEF COMPLAINT:   Chief Complaint  Patient presents with  . Lung Abcess  Left sided Pleuritic Chest pain with On and off Fever.   HISTORY OF PRESENT ILLNESS:  Summer Howard  is a 45 y.o. female with a known history of anxiety, depression, chronic iron deficiency anemia, history of lap gastric bypass, history of volvulus, who presents to the hospital due to left-sided pleuritic chest pain and fever on and off for the past week or so. Patient was recently hospitalized and treated for a complete right pneumonia and discharged on oral Augmentin. She continued to have left-sided pleuritic chest pain and had a fever 102 a few days back. She went to see her primary care physician who started her back on the Augmentin and also order a CT scan of her chest. Heart CT scan of the chest shows a lung abscess/necrotizing pneumonia and also left-sided pleural effusion. She was therefore referred to the ER for further evaluation. Patient admits to fever as high as 102.4 a few days back. She admits to a cough which is productive with blood-tinged sputum. She denies any nausea, vomiting, abdominal pain, headache, dizziness or any other associated symptoms.  PAST MEDICAL HISTORY:   Past Medical History  Diagnosis Date  . Anxiety   . Depression   . Iron deficiency anemia   . Thrombocytopenia (HCC)   . Gastric ulcer     PAST SURGICAL HISTORY:   Past Surgical History  Procedure Laterality Date  . Cesarean section    . Cholecystectomy    . Appendectomy    . Gastric bypass  2011  . Colon surgery      SOCIAL HISTORY:   Social History  Substance Use Topics  . Smoking status: Never Smoker   . Smokeless tobacco: Never Used  .  Alcohol Use: No    FAMILY HISTORY:   Family History  Problem Relation Age of Onset  . Breast cancer Mother   . Hypertension Father   . Prostate cancer Father   . Hypertension Brother   . Anxiety disorder Brother     DRUG ALLERGIES:   Allergies  Allergen Reactions  . Phenobarbital Rash  . Sulfa Antibiotics Rash    REVIEW OF SYSTEMS:   Review of Systems  Constitutional: Negative for fever and weight loss.  HENT: Negative for congestion, nosebleeds and tinnitus.   Eyes: Negative for blurred vision, double vision and redness.  Respiratory: Positive for cough, hemoptysis, sputum production and shortness of breath.   Cardiovascular: Positive for chest pain (pleuritic). Negative for orthopnea, leg swelling and PND.  Gastrointestinal: Negative for nausea, vomiting, abdominal pain, diarrhea and melena.  Genitourinary: Negative for dysuria, urgency and hematuria.  Musculoskeletal: Negative for joint pain and falls.  Neurological: Negative for dizziness, tingling, sensory change, focal weakness, seizures, weakness and headaches.  Endo/Heme/Allergies: Negative for polydipsia. Does not bruise/bleed easily.  Psychiatric/Behavioral: Negative for depression and memory loss. The patient is not nervous/anxious.     MEDICATIONS AT HOME:   Prior to Admission medications   Medication Sig Start Date End Date Taking? Authorizing Provider  acetaminophen (TYLENOL) 500 MG tablet Take 1,000 mg by mouth every 6 (six) hours as needed for mild pain, fever or headache.  Yes Historical Provider, MD  amoxicillin-clavulanate (AUGMENTIN) 875-125 MG tablet Take 1 tablet by mouth every 12 (twelve) hours. 01/23/16  Yes Shaune Pollack, MD  aspirin EC 81 MG tablet Take 1 tablet (81 mg total) by mouth daily. 01/23/16  Yes Shaune Pollack, MD  calcium carbonate (TUMS - DOSED IN MG ELEMENTAL CALCIUM) 500 MG chewable tablet Chew 2 tablets by mouth as needed for indigestion or heartburn.   Yes Historical Provider, MD  Multiple  Vitamin (MULTIVITAMIN WITH MINERALS) TABS tablet Take 1 tablet by mouth daily.   Yes Historical Provider, MD  ondansetron (ZOFRAN) 4 MG tablet Take 4 mg by mouth every 8 (eight) hours as needed for nausea or vomiting.    Yes Historical Provider, MD  pantoprazole (PROTONIX) 40 MG tablet Take 40 mg by mouth daily.    Yes Historical Provider, MD  sucralfate (CARAFATE) 1 GM/10ML suspension Take 1 g by mouth 3 (three) times daily with meals.   Yes Historical Provider, MD      VITAL SIGNS:  Blood pressure 152/89, pulse 79, temperature 98.2 F (36.8 C), temperature source Oral, resp. rate 18, height  (1.575 m), weight 54.432 kg (120 lb), SpO2 100 %.  PHYSICAL EXAMINATION:  Physical Exam  GENERAL:  45 y.o.-year-old patient lying in the bed in no acute distress.  EYES: Pupils equal, round, reactive to light and accommodation. No scleral icterus. Extraocular muscles intact.  HEENT: Head atraumatic, normocephalic. Oropharynx and nasopharynx clear. No oropharyngeal erythema, moist oral mucosa  NECK:  Supple, no jugular venous distention. No thyroid enlargement, no tenderness.  LUNGS: Poor air entry at the left base. Positive rhonchi, rales at the left base. Negative use of accessory muscles. CARDIOVASCULAR: S1, S2 RRR. No murmurs, rubs, gallops, clicks.  ABDOMEN: Soft, nontender, nondistended. Bowel sounds present. No organomegaly or mass.  EXTREMITIES: No pedal edema, cyanosis, or clubbing. + 2 pedal & radial pulses b/l.   NEUROLOGIC: Cranial nerves II through XII are intact. No focal Motor or sensory deficits appreciated b/l PSYCHIATRIC: The patient is alert and oriented x 3. Good affect.  SKIN: No obvious rash, lesion, or ulcer.   LABORATORY PANEL:   CBC  Recent Labs Lab 02/05/16 0930  WBC 7.4  HGB 9.4*  HCT 28.3*  PLT 817*   ------------------------------------------------------------------------------------------------------------------  Chemistries   Recent Labs Lab  02/05/16 0930  NA 140  K 2.9*  CL 105  CO2 30  GLUCOSE 54*  BUN <5*  CREATININE 0.50  CALCIUM 8.5*   ------------------------------------------------------------------------------------------------------------------  Cardiac Enzymes No results for input(s): TROPONINI in the last 168 hours. ------------------------------------------------------------------------------------------------------------------  RADIOLOGY:  Ct Chest W Contrast  02/04/2016  CLINICAL DATA:  Severe left-sided chest pain and congestion, recent diagnosis of pneumonia, completed antibiotic therapy EXAM: CT CHEST WITH CONTRAST TECHNIQUE: Multidetector CT imaging of the chest was performed during intravenous contrast administration. CONTRAST:  75mL OMNIPAQUE IOHEXOL 300 MG/ML  SOLN COMPARISON:  Chest x-ray of 01/21/2016 FINDINGS: There is airspace disease throughout the inferior lateral aspect of the left upper lobe extending peripherally with areas of cavitation most consistent with necrotizing pneumonia, generally bacterial or viral in origin. Some involvement of the superior segment of the left lower lobe also is noted. Minimal opacity is noted within the anterior inferior right upper lobe on image 26, and in the posterior right lower lobe on image 35. These may represent areas of early infiltrate as well. There is a moderate size left pleural effusion present. The right lung is clear. The central airway is patent. On  soft tissue window images, the thyroid gland is unremarkable. On this enhanced study, the thoracic aorta opacifies with no significant abnormality noted. The pulmonary arteries are not as well opacified but no central abnormality is evident. No mediastinal or hilar adenopathy is seen. Surgical sutures are noted from prior gastric bypass surgery. Clips are present from prior cholecystectomy. The thoracic vertebrae are in normal alignment with normal disc spaces. IMPRESSION: 1. Extensive parenchymal infiltrate  throughout the left upper lobe and lingula as well as the left lower lobe with areas of cavitation primarily involving the inferior left upper lobe and lingula. In view of the extensive involvement, necrotizing pneumonia is the primary consideration generally bacterial or fungal in origin. Patchy areas of involvement also are noted in the right upper and right lower lobes. 2. Moderate size left pleural effusion. Electronically Signed   By: Dwyane Dee M.D.   On: 02/04/2016 08:17     IMPRESSION AND PLAN:   44 year old female with past medical history of GERD, peptic ulcer disease, history of lap gastric bypass, anxiety/depression, chronic iron deficient anemia who presents to the hospital due to left-sided pleuritic chest pain and fever and noted to have a pneumonia.  #1 necrotizing pneumonia with pleural effusion-patient had a CT scan as an outpatient which showed a large pneumonia possible necrotizing in nature and also a left-sided pleural effusion. -I will start the patient on broad-spectrum IV antibiotics with vancomycin, Zosyn. -I will get an ultrasound-guided thoracentesis. We'll get an infectious disease, cardiac thoracic surgery consult. -Clinically patient is afebrile and hemodynamically stable.  #2 hypokalemia-I will place the patient on oral potassium supplements. Repeat level in the morning. -Check a magnesium level.  #3 GERD-continue Protonix.  #4 anemia of chronic disease-patient has chronic iron deficiency anemia secondary to her lap gastric bypass. -Continue iron supplements. Hemoglobin stable.  #5 thrombocytosis-reactive in nature and possibly related to underlying sepsis and pneumonia. -We'll follow platelet count.   All the records are reviewed and case discussed with ED provider. Management plans discussed with the patient, family and they are in agreement.  CODE STATUS: Full  TOTAL TIME TAKING CARE OF THIS PATIENT: 50 minutes.    Houston Siren M.D on 02/05/2016  at 12:28 PM  Between 7am to 6pm - Pager - [phone]  After 6pm go to [email or web address] - password [email or web address] [facility]  [facility]  Office  [phone]  CC: Primary care physician; Rafael Bihari, MD

## 2016-02-05 NOTE — ED Notes (Signed)
Pt states she was referred to the ED for admission for lung abscess.. States she was recently treated for pneumonia and then had CT on 2/21 for continued URI sx and discovered the lung abscess.Marland Kitchen

## 2016-02-05 NOTE — ED Notes (Signed)
Dr Shaune Pollack notified K 2.9

## 2016-02-05 NOTE — Progress Notes (Addendum)
Pharmacy Antibiotic Note  Summer Howard is a 45 y.o. female admitted on 02/05/2016 with pneumonia.  Pharmacy has been consulted for Vancomycin and Zosyn dosing for patient with CT of chest showing lung abscess/necrotizing pneumonia.   Plan: Patient received Vancomycin 1 gm IV once in ED at 1140.  Will order stacked dose to of 750 mg IV once at 2200.  Will then start patient on Vancomycin 750 mg IV q12h.  Goal trough 15-20 mcg/ml.  Trough prior to dose on 2/25 at 0930 (should be at steady state).  Zosyn 3.375 g IV once given in ED at 1150.  Will start Zosyn 3.375 gm IV q8h per EI protocol at 1800 today.   SCr: 0.5 (0.8), est CrCl~72.5 mL/min, ke: 0.065, t1/2: 10.7 h, VD: 37.8 L  Height:  (157.5 cm) Weight: 120 lb (54.432 kg) IBW/kg (Calculated) : 50.1  Temp (24hrs), Avg:98.1 F (36.7 C), Min:98 F (36.7 C), Max:98.2 F (36.8 C)   Recent Labs Lab 01/30/16 1007 02/05/16 0930  WBC 12.3* 7.4  CREATININE  --  0.50    Estimated Creatinine Clearance: 72.5 mL/min (by C-G formula based on Cr of 0.5).    Allergies  Allergen Reactions  . Phenobarbital Rash  . Sulfa Antibiotics Rash    Antimicrobials this admission: Zosyn 2/23 >>  Vancomycin 2/23 >>   Microbiology results: 2/23 Sputum Cx pending 2/23 Blood Cx pending x 2  Thank you for allowing pharmacy to be a part of this patient's care.  Keiaira Donlan G 02/05/2016 2:14 PM

## 2016-02-05 NOTE — ED Notes (Signed)
RN attempted to call report, RN in a rapid right now, will call this RN back momentarily.

## 2016-02-05 NOTE — Progress Notes (Signed)
   02/05/16 1448  Clinical Encounter Type  Visited With Patient and family together  Visit Type Initial  Referral From Nurse  Consult/Referral To Chaplain  Chaplain provided educational materials to patient for completing an advanced directive. Patient stated she needed some time to go over material and to make a decision. She will call us if she desires to complete forms.   Chaplain Kwynn Schlotter Ext: [phone]

## 2016-02-05 NOTE — Progress Notes (Signed)
I have independently reviewed this patient's CT scan and met with her in the interventional radiology suite. An attempt at thoracentesis was unsuccessful secondary to the complex nature of the small pleural effusion.  I discussed with the patient her most recent symptoms. She's been short of breath with a cough and some low-grade fevers for the last several weeks. She's been treated off and on with various antibiotics and was admitted to our hospital with the above symptoms and had several days of intravenous antibiotics at which point she was then discharged to home. She was home for several days when she again experienced fevers chills night sweats and presented to Dr. Thomes Dinning office. A CT scan was performed showing a large cavity in the left upper lobe consistent with a lung abscess.  Currently the patient has some pleuritic type chest pain. She is not toxic in appearance. She has had scant hemoptysis.  I have reviewed her CT scans with our radiologist. Of also discussed her care with the thoracic surgeons at [facility]. I believe that she is likely require a thoracotomy with debridement and intrathoracic transposition of her latissimus dorsi muscle or the serratus anterior muscle. These are procedures that we cannot perform here at our institution and the thoracic surgeons have agreed to perform this at [facility] early next week. I discussed her care with them by phone today and she will be transferred to the hospitalist service tomorrow for surgery next week.

## 2016-02-05 NOTE — ED Provider Notes (Signed)
[facility] Emergency Department Provider Note   ____________________________________________  Time seen:  I have reviewed the triage vital signs and the triage nursing note.  HISTORY  Chief Complaint Lung Abcess   Historian Patient  HPI Summer Howard is a 45 y.o. female with a history of recent treatment of pneumonia including three-day hospital stay, and completed course of Avelox.  She had been having pretty significant pleuritic left-sided chest pain during the hospital stay with her pneumonia. She had a chest x-ray during her hospital stay. She was discharged home to complete her antibiotics. She started feeling nausea and sweats and still was having severe 10 out of 10 left chest pain with breathing coughing and movement. She followed up with her doctor, Dr. Dan Humphreys, and had an outpatient CT scan which showed necrotizing pneumonia. She reports some shortness of breath and cough, although nonproductive.    Past Medical History  Diagnosis Date  . Anxiety   . Depression   . Iron deficiency anemia   . Thrombocytopenia (HCC)   . Gastric ulcer     Patient Active Problem List   Diagnosis Date Noted  . Chest pain with low risk for cardiac etiology 01/21/2016  . Pneumonia 01/20/2016  . Pleuritic chest pain 01/20/2016  . Absolute anemia 10/22/2014  . 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 Sex counseling 10/22/2014  . Malnutrition following gastrointestinal surgery 10/22/2014  . Feeling bilious 10/22/2014  . 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 Personal history of surgery to heart and great vessels, presenting hazards to health 07/31/2014  . Gastroduodenal ulcer 07/31/2014  . Anxiety, generalized 03/07/2014  . Panic attack 03/07/2014  . Non-healing surgical wound 07/06/2013  . 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 SBO (small bowel obstruction) (HCC) 04/02/2013  . Bariatric surgery status 06/21/2012  .  Fatty infiltration of liver 02/25/2010  . Adiposity 02/25/2010    Past Surgical History  Procedure Laterality Date  . Cesarean section    . Cholecystectomy    . Appendectomy    . Gastric bypass  2011  . Colon surgery      Current Outpatient Rx  Name  Route  Sig  Dispense  Refill  . acetaminophen (TYLENOL) 500 MG tablet   Oral   Take 1,000 mg by mouth every 6 (six) hours as needed for mild pain, fever or headache.         Marland Kitchen amoxicillin-clavulanate (AUGMENTIN) 875-125 MG tablet   Oral   Take 1 tablet by mouth every 12 (twelve) hours.   14 tablet   0   . aspirin EC 81 MG tablet   Oral   Take 1 tablet (81 mg total) by mouth daily.   30 tablet   0   . benzonatate (TESSALON PERLES) 100 MG capsule   Oral   Take 1 capsule (100 mg total) by mouth 3 (three) times daily as needed for cough.   20 capsule   0   . calcium carbonate (TUMS - DOSED IN MG ELEMENTAL CALCIUM) 500 MG chewable tablet   Oral   Chew 2 tablets by mouth as needed for indigestion or heartburn.         . mirtazapine (REMERON) 15 MG tablet   Oral   Take 1 tablet (15 mg total) by mouth at bedtime.   30 tablet   1   . Multiple Vitamin (MULTIVITAMIN WITH MINERALS) TABS tablet   Oral   Take 1 tablet by mouth daily.         Marland Kitchen  ondansetron (ZOFRAN) 4 MG tablet   OraMarland Kitchenl   Take 4 mg by mouth every 8 (eight) hours as needed for nausea or vomiting.          . oxyCODONE-acetaminophen (PERCOCET/ROXICET) 5-325 MG tablet   Oral   Take 1 tablet by mouth every 8 (eight) hours as needed for moderate pain or severe pain. Patient not taking: Reported on 01/30/2016   12 tablet   0   . pantoprazole (PROTONIX) 40 MG tablet   Oral   Take 40 mg by mouth daily.          . sucralfate (CARAFATE) 1 GM/10ML suspension   Oral   Take 1 g by mouth 3 (three) times daily with meals.           Allergies Phenobarbital and Sulfa antibiotics  Family History  Problem Relation Age of Onset  . Breast cancer Mother    . Hypertension Father   . Prostate cancer Father   . Hypertension Brother   . Anxiety disorder Brother     Social History Social History  Substance Use Topics  . Smoking status: Never Smoker   . Smokeless tobacco: Never Used  . Alcohol Use: No    Review of Systems  Constitutional: Positive for subjective fevers. Eyes: Negative for visual changes. ENT: Negative for sore throat. Cardiovascular: Positive for left-sided pleuritic chest pain. Respiratory: Positive for shortness of breath. Gastrointestinal: Negative for abdominal pain, vomiting and diarrhea. Genitourinary: Negative for dysuria. Musculoskeletal: Negative for back pain. Skin: Negative for rash. Neurological: Negative for headache. 10 point Review of Systems otherwise negative ____________________________________________   PHYSICAL EXAM:  VITAL SIGNS: ED Triage Vitals  Enc Vitals Group     BP 02/05/16 0920 136/91 mmHg     Pulse Rate 02/05/16 0920 89     Resp 02/05/16 0920 18     Temp 02/05/16 0920 98.2 F (36.8 C)     Temp Source 02/05/16 0920 Oral     SpO2 02/05/16 0920 100 %     Weight 02/05/16 0920 120 lb (54.432 kg)     Height 02/05/16 0920  (1.575 m)     Head Cir --      Peak Flow --      Pain Score 02/05/16 0921 7     Pain Loc --      Pain Edu? --      Excl. in GC? --      Constitutional: Alert and oriented. Well appearing and in no distress. HEENT   Head: Normocephalic and atraumatic.      Eyes: Conjunctivae are normal. PERRL. Normal extraocular movements.      Ears:         Nose: No congestion/rhinnorhea.   Mouth/Throat: Mucous membranes are moist.   Neck: No stridor. Cardiovascular/Chest: Normal rate, regular rhythm.  No murmurs, rubs, or gallops. Respiratory: Normal respiratory effort without tachypnea nor retractions. Decreased breath sounds left lung field without rhonchi or wheezing. Gastrointestinal: Soft. No distention, no guarding, no rebound. Nontender.     Genitourinary/rectal:Deferred Musculoskeletal: Nontender with normal range of motion in all extremities. No joint effusions.  No lower extremity tenderness.  No edema. Neurologic:  Normal speech and language. No gross or focal neurologic deficits are appreciated. Skin:  Skin is warm, dry and intact. No rash noted. Psychiatric: Mood and affect are normal. Speech and behavior are normal. Patient exhibits appropriate insight and judgment.  ____________________________________________   EKG I, Governor Rooks, MD, the attending physician have personally viewed  and interpreted all ECGs.  See separate note ____________________________________________  LABS (pertinent positives/negatives)  White blood count 7.4, hemoglobin 94 and platelets count is 17 Basic metabolic panel significant for potassium 2.9  ____________________________________________  RADIOLOGY All Xrays were viewed by me. Imaging interpreted by Radiologist.  Reviewed chest CT within the system from 2 days ago, consistent with left-sided necrotizing pneumonia/empyema __________________________________________  PROCEDURES  Procedure(s) performed: None  Critical Care performed: None  ____________________________________________   ED COURSE / ASSESSMENT AND PLAN  Pertinent labs & imaging results that were available during my care of the patient were reviewed by me and considered in my medical decision making (see chart for details).   Patient arrived with stable vital signs with an outpatient CT showing necrotizing pneumonia/empyema. She does have 100% O2 sat here. She is symptomatic with pain, pleuritic on the left side. She was given symptomatic morphine and so a friend.  For the empyema/persistent pneumonia, vancomycin and Zosyn were started after blood cultures. Antibiotics, hospitalization, and likely drainage of empyema.   CONSULTATIONS:   Face-to-face discussion with hospitalist Dr. Cherlynn Kaiser for hospital  admission.   Patient / Family / Caregiver informed of clinical course, medical decision-making process, and agree with plan.   ___________________________________________   FINAL CLINICAL IMPRESSION(S) / ED DIAGNOSES   Final diagnoses:  Empyema, left (HCC)  Necrotizing pneumonia (HCC)              Note: This dictation was prepared with Dragon dictation. Any transcriptional errors that result from this process are unintentional   Governor Rooks, MD 02/05/16 1133

## 2016-02-06 ENCOUNTER — Encounter (HOSPITAL_COMMUNITY): Payer: Self-pay | Admitting: Internal Medicine

## 2016-02-06 ENCOUNTER — Inpatient Hospital Stay (HOSPITAL_COMMUNITY)
Admission: AD | Admit: 2016-02-06 | Discharge: 2016-02-19 | DRG: 163 | Disposition: A | Discharge status: Home / Self Care | Payer: Medicaid Other | Source: Other Acute Inpatient Hospital | Attending: Internal Medicine | Admitting: Internal Medicine

## 2016-02-06 ENCOUNTER — Ambulatory Visit: Payer: Medicaid Other | Admitting: Cardiothoracic Surgery

## 2016-02-06 DIAGNOSIS — Z885 Allergy status to narcotic agent status: Secondary | ICD-10-CM | POA: Diagnosis not present

## 2016-02-06 DIAGNOSIS — Z79899 Other long term (current) drug therapy: Secondary | ICD-10-CM | POA: Diagnosis not present

## 2016-02-06 DIAGNOSIS — D7589 Other specified diseases of blood and blood-forming organs: Secondary | ICD-10-CM | POA: Diagnosis present

## 2016-02-06 DIAGNOSIS — E876 Hypokalemia: Secondary | ICD-10-CM | POA: Diagnosis present

## 2016-02-06 DIAGNOSIS — J90 Pleural effusion, not elsewhere classified: Secondary | ICD-10-CM | POA: Diagnosis not present

## 2016-02-06 DIAGNOSIS — K912 Postsurgical malabsorption, not elsewhere classified: Secondary | ICD-10-CM | POA: Diagnosis present

## 2016-02-06 DIAGNOSIS — J189 Pneumonia, unspecified organism: Secondary | ICD-10-CM | POA: Diagnosis not present

## 2016-02-06 DIAGNOSIS — F329 Major depressive disorder, single episode, unspecified: Secondary | ICD-10-CM | POA: Diagnosis present

## 2016-02-06 DIAGNOSIS — K287 Chronic gastrojejunal ulcer without hemorrhage or perforation: Secondary | ICD-10-CM | POA: Diagnosis not present

## 2016-02-06 DIAGNOSIS — E43 Unspecified severe protein-calorie malnutrition: Secondary | ICD-10-CM | POA: Diagnosis present

## 2016-02-06 DIAGNOSIS — R042 Hemoptysis: Secondary | ICD-10-CM | POA: Diagnosis present

## 2016-02-06 DIAGNOSIS — F419 Anxiety disorder, unspecified: Secondary | ICD-10-CM | POA: Diagnosis present

## 2016-02-06 DIAGNOSIS — K289 Gastrojejunal ulcer, unspecified as acute or chronic, without hemorrhage or perforation: Secondary | ICD-10-CM | POA: Diagnosis present

## 2016-02-06 DIAGNOSIS — J852 Abscess of lung without pneumonia: Secondary | ICD-10-CM | POA: Diagnosis present

## 2016-02-06 DIAGNOSIS — R079 Chest pain, unspecified: Secondary | ICD-10-CM | POA: Diagnosis not present

## 2016-02-06 DIAGNOSIS — Z882 Allergy status to sulfonamides status: Secondary | ICD-10-CM | POA: Diagnosis not present

## 2016-02-06 DIAGNOSIS — Z9884 Bariatric surgery status: Secondary | ICD-10-CM

## 2016-02-06 DIAGNOSIS — Z881 Allergy status to other antibiotic agents status: Secondary | ICD-10-CM | POA: Diagnosis not present

## 2016-02-06 DIAGNOSIS — Z7982 Long term (current) use of aspirin: Secondary | ICD-10-CM | POA: Diagnosis not present

## 2016-02-06 DIAGNOSIS — K219 Gastro-esophageal reflux disease without esophagitis: Secondary | ICD-10-CM | POA: Diagnosis not present

## 2016-02-06 DIAGNOSIS — J918 Pleural effusion in other conditions classified elsewhere: Secondary | ICD-10-CM | POA: Diagnosis not present

## 2016-02-06 DIAGNOSIS — J869 Pyothorax without fistula: Secondary | ICD-10-CM | POA: Diagnosis not present

## 2016-02-06 DIAGNOSIS — E46 Unspecified protein-calorie malnutrition: Secondary | ICD-10-CM | POA: Diagnosis present

## 2016-02-06 DIAGNOSIS — Z8711 Personal history of peptic ulcer disease: Secondary | ICD-10-CM

## 2016-02-06 DIAGNOSIS — J850 Gangrene and necrosis of lung: Secondary | ICD-10-CM | POA: Diagnosis present

## 2016-02-06 DIAGNOSIS — J851 Abscess of lung with pneumonia: Secondary | ICD-10-CM | POA: Diagnosis present

## 2016-02-06 DIAGNOSIS — J9811 Atelectasis: Secondary | ICD-10-CM

## 2016-02-06 DIAGNOSIS — D6949 Other primary thrombocytopenia: Secondary | ICD-10-CM | POA: Diagnosis present

## 2016-02-06 DIAGNOSIS — J939 Pneumothorax, unspecified: Secondary | ICD-10-CM

## 2016-02-06 DIAGNOSIS — D509 Iron deficiency anemia, unspecified: Secondary | ICD-10-CM | POA: Diagnosis present

## 2016-02-06 DIAGNOSIS — Z79891 Long term (current) use of opiate analgesic: Secondary | ICD-10-CM | POA: Diagnosis not present

## 2016-02-06 DIAGNOSIS — A419 Sepsis, unspecified organism: Secondary | ICD-10-CM | POA: Diagnosis not present

## 2016-02-06 DIAGNOSIS — D649 Anemia, unspecified: Secondary | ICD-10-CM | POA: Diagnosis present

## 2016-02-06 DIAGNOSIS — Z888 Allergy status to other drugs, medicaments and biological substances status: Secondary | ICD-10-CM | POA: Diagnosis not present

## 2016-02-06 DIAGNOSIS — R0781 Pleurodynia: Secondary | ICD-10-CM | POA: Diagnosis present

## 2016-02-06 HISTORY — DX: Unspecified protein-calorie malnutrition: E46

## 2016-02-06 HISTORY — DX: Pneumonia, unspecified organism: J18.9

## 2016-02-06 HISTORY — DX: Volvulus: K56.2

## 2016-02-06 HISTORY — DX: Chronic gastrojejunal ulcer without hemorrhage or perforation: K28.7

## 2016-02-06 HISTORY — DX: Abscess of lung without pneumonia: J85.2

## 2016-02-06 HISTORY — DX: Postsurgical malabsorption, not elsewhere classified: K91.2

## 2016-02-06 HISTORY — DX: Other primary thrombocytopenia: D69.49

## 2016-02-06 LAB — CBC
HEMATOCRIT: 30.4 % — AB (ref 35.0–47.0)
Hemoglobin: 9.9 g/dL — ABNORMAL LOW (ref 12.0–16.0)
MCH: 26.5 pg (ref 26.0–34.0)
MCHC: 32.6 g/dL (ref 32.0–36.0)
MCV: 81.4 fL (ref 80.0–100.0)
PLATELETS: 804 10*3/uL — AB (ref 150–440)
RBC: 3.74 MIL/uL — ABNORMAL LOW (ref 3.80–5.20)
RDW: 14.7 % — AB (ref 11.5–14.5)
WBC: 6.5 10*3/uL (ref 3.6–11.0)

## 2016-02-06 LAB — BASIC METABOLIC PANEL
Anion gap: 6 (ref 5–15)
CALCIUM: 8.7 mg/dL — AB (ref 8.9–10.3)
CO2: 28 mmol/L (ref 22–32)
Chloride: 103 mmol/L (ref 101–111)
Creatinine, Ser: 0.47 mg/dL (ref 0.44–1.00)
GFR calc Af Amer: >60 mL/min (ref >60)
GLUCOSE: 81 mg/dL (ref 65–99)
POTASSIUM: 3.8 mmol/L (ref 3.5–5.1)
SODIUM: 137 mmol/L (ref 135–145)

## 2016-02-06 LAB — C DIFFICILE QUICK SCREEN W PCR REFLEX
C Diff antigen: NEGATIVE
C Diff interpretation: NEGATIVE
C Diff toxin: NEGATIVE

## 2016-02-06 MED ORDER — PIPERACILLIN-TAZOBACTAM 3.375 G IVPB: 3.3750 g | Freq: Three times a day (TID) | INTRAVENOUS | Status: DC

## 2016-02-06 MED ORDER — OXYCODONE-ACETAMINOPHEN 5-325 MG PO TABS
1.0000 | ORAL_TABLET | ORAL | Status: DC | PRN
  Administered 2016-02-06 – 2016-02-09 (×15): 2 via ORAL
  Filled 2016-02-06 (×16): qty 2

## 2016-02-06 MED ORDER — LOPERAMIDE HCL 2 MG PO CAPS
2.0000 mg | ORAL_CAPSULE | ORAL | Status: DC | PRN
  Administered 2016-02-07 – 2016-02-12 (×6): 2 mg via ORAL
  Filled 2016-02-06 (×6): qty 1

## 2016-02-06 MED ORDER — PIPERACILLIN-TAZOBACTAM 3.375 G IVPB
3.3750 g | Freq: Three times a day (TID) | INTRAVENOUS | Status: DC
  Administered 2016-02-06 – 2016-02-18 (×34): 3.375 g via INTRAVENOUS
  Filled 2016-02-06 (×42): qty 50

## 2016-02-06 MED ORDER — VANCOMYCIN HCL IN DEXTROSE 750-5 MG/150ML-% IV SOLN
750.0000 mg | Freq: Two times a day (BID) | INTRAVENOUS | Status: DC
  Administered 2016-02-06 – 2016-02-07 (×2): 750 mg via INTRAVENOUS
  Filled 2016-02-06 (×3): qty 150

## 2016-02-06 MED ORDER — RISAQUAD PO CAPS: 2.0000 | ORAL_CAPSULE | Freq: Every day | ORAL | Status: DC

## 2016-02-06 MED ORDER — SACCHAROMYCES BOULARDII 250 MG PO CAPS
250.0000 mg | ORAL_CAPSULE | Freq: Two times a day (BID) | ORAL | Status: DC
  Administered 2016-02-06 – 2016-02-19 (×26): 250 mg via ORAL
  Filled 2016-02-06 (×27): qty 1

## 2016-02-06 MED ORDER — PANTOPRAZOLE SODIUM 40 MG PO TBEC
40.0000 mg | DELAYED_RELEASE_TABLET | Freq: Every day | ORAL | Status: DC
  Administered 2016-02-07 – 2016-02-19 (×13): 40 mg via ORAL
  Filled 2016-02-06 (×13): qty 1

## 2016-02-06 MED ORDER — SODIUM CHLORIDE 0.9% FLUSH: 3.0000 mL | INTRAVENOUS | Status: DC | PRN

## 2016-02-06 MED ORDER — METOCLOPRAMIDE HCL 5 MG/ML IJ SOLN
5.0000 mg | Freq: Four times a day (QID) | INTRAMUSCULAR | Status: DC | PRN
  Administered 2016-02-07: 5 mg via INTRAVENOUS
  Filled 2016-02-06: qty 2

## 2016-02-06 MED ORDER — VANCOMYCIN HCL IN DEXTROSE 750-5 MG/150ML-% IV SOLN: 750.0000 mg | Freq: Two times a day (BID) | INTRAVENOUS | Status: DC

## 2016-02-06 MED ORDER — SUCRALFATE 1 GM/10ML PO SUSP
1.0000 g | Freq: Three times a day (TID) | ORAL | Status: DC
  Administered 2016-02-07 – 2016-02-19 (×36): 1 g via ORAL
  Filled 2016-02-06 (×36): qty 10

## 2016-02-06 MED ORDER — ZOLPIDEM TARTRATE 5 MG PO TABS
5.0000 mg | ORAL_TABLET | Freq: Once | ORAL | Status: AC
  Administered 2016-02-06: 5 mg via ORAL
  Filled 2016-02-06: qty 1

## 2016-02-06 MED ORDER — SODIUM CHLORIDE 0.9 % IV SOLN: 250.0000 mL | INTRAVENOUS | Status: DC | PRN

## 2016-02-06 MED ORDER — RISAQUAD PO CAPS
2.0000 | ORAL_CAPSULE | Freq: Every day | ORAL | Status: DC
  Administered 2016-02-06: 2 via ORAL
  Filled 2016-02-06: qty 2

## 2016-02-06 MED ORDER — OXYCODONE-ACETAMINOPHEN 5-325 MG PO TABS: 1.0000 | ORAL_TABLET | ORAL | Status: DC | PRN

## 2016-02-06 MED ORDER — SODIUM CHLORIDE 0.9% FLUSH
3.0000 mL | Freq: Two times a day (BID) | INTRAVENOUS | Status: DC
  Administered 2016-02-09 – 2016-02-15 (×3): 3 mL via INTRAVENOUS

## 2016-02-06 MED ORDER — RISAQUAD PO CAPS
2.0000 | ORAL_CAPSULE | Freq: Every day | ORAL | Status: DC
  Administered 2016-02-06 – 2016-02-19 (×13): 2 via ORAL
  Filled 2016-02-06 (×14): qty 2

## 2016-02-06 NOTE — Progress Notes (Signed)
Pt alert and oriented. Received call from [facility], gave report on patient. Pt is salined locked for transport. Report called to Leotis Shames, RN at [facility]. Pt Concerns addressed. Continue to assess.

## 2016-02-06 NOTE — Progress Notes (Signed)
Pharmacy Antibiotic Note  Summer Howard is a 45 y.o. female admitted on 02/06/2016 with pneumonia.  Pharmacy has been consulted for Vancomycin and Zosyn dosing for patient with CT of chest showing lung abscess/necrotizing pneumonia. She was tx here for a possible thoracotomy. She has been on vanc/zosyn at [employer]   Plan:  Vancomycin 750 mg IV q12h.  Goal trough 15-20 mcg/ml Zosyn 3.375 gm IV q8h per EI protocol at 1800 today  Will get trough in AM   Height:  (157.5 cm) Weight: 120 lb (54.432 kg) IBW/kg (Calculated) : 50.1  Temp (24hrs), Avg:99.1 F (37.3 C), Min:98 F (36.7 C), Max:100.5 F (38.1 C)   Recent Labs Lab 02/05/16 0930 02/06/16 0702  WBC 7.4 6.5  CREATININE 0.50 0.47    Estimated Creatinine Clearance: 72.5 mL/min (by C-G formula based on Cr of 0.47).    Allergies  Allergen Reactions  . Phenobarbital Rash  . Sulfa Antibiotics Rash    Antimicrobials this admission: Zosyn 2/23 >>  Vancomycin 2/23 >>   Microbiology results: 2/23 Sputum Cx pending 2/23 Blood Cx pending x 2  Thank you for allowing pharmacy to be a part of this patient's care.  Ulyses Southward, PharmD Pager: [phone] 02/06/2016 7:15 PM

## 2016-02-06 NOTE — H&P (Signed)
PCP:  Rafael Bihari, MD    Referring provider from [facility]   Reason for transfer: Necrotizing pneumonia with pleural effusion needing surgical intervention  HPI: Summer Howard is a 45 y.o. female   has a past medical history of Anxiety; Depression; Thrombocytopenia (HCC); Gastric ulcer; and Iron deficiency anemia.   23rd of February patient was admitted to [facility] with fever and plueritic chest pain for prior week involving right side. Of note patient recently was admitted [facility] for pneumonia and has undergone 10 days of inpatient treatment was discharged home on Augmentin. Her pain continued and she still continue have intermittent fevers. Primary care physician restarted her Augmentin and ordered CT scan 1/23 she has undergone CT scan of the chest that showed necrotizing pneumonia with lung abscess. Patient was started on broad-spectrum antibiotics including vancomycin and Zosyn. Ultrasound-guided thoracentesis was attempted but failed secondary to complexity of the fluid. Patient was seen in consult by cardiothoracic surgeon Dr. Thelma Barge who recommended transfer to [facility] for debridement and thoracotomy as well as intrathoracic transposition of latissimus dorsi muscle over sit up this anterior muscle as well as possible plastics consult. Throughout her hospital stay since admission patient has been afebrile and hemodynamically stable monitored on med surge bed blood cultures were obtained and were negative. She was also tested for C. difficile which was negative as well. Of note she had some transient elevation in troponin during first admission was seen by cardiology who felt no ACS echo was done. She was discharged on baby aspirin.   She continues to have pain on her left side. Associated with some nausea. She has increased in diarrhea c.diff was negative. Reports occasional hemoptysis but mainly brown sputum production  No foul sputum.   Her notes case was  discussed with Dr. Alla German with cardiothoracic surgery at [facility] who agreed with transfer   Regarding pertinent past history: Patient has known history of iron deficiency anemia contrary to history of gastric bypass and peptic ulcer disease she has been in the past iron infusion and currently on iron supplements globin is at baseline around 9. She has ongoing thrombocytosis which has been stable and thought to be secondary to underlying infection.   Hospitalist was called for transfer for necrotizing pneumonia with empyema needing surgical intervention  Review of Systems:    Pertinent positives include: Chest pain shortness of breath at rest. dyspnea on exertion,   Constitutional:  No weight loss, night sweats, Fevers, chills, fatigue, weight loss  HEENT:  No headaches, Difficulty swallowing,Tooth/dental problems,Sore throat,  No sneezing, itching, ear ache, nasal congestion, post nasal drip,  Cardio-vascular:   Orthopnea, PND, anasarca, dizziness, palpitations.no Bilateral lower extremity swelling  GI:  No heartburn, indigestion, abdominal pain, nausea, vomiting, diarrhea, change in bowel habits, loss of appetite, melena, blood in stool, hematemesis Resp:   Noo excess mucus, no productive cough, No non-productive cough, No coughing up of blood.No change in color of mucus.No wheezing. Skin:  no rash or lesions. No jaundice GU:  no dysuria, change in color of urine, no urgency or frequency. No straining to urinate.  No flank pain.  Musculoskeletal:  No joint pain or no joint swelling. No decreased range of motion. No back pain.  Psych:  No change in mood or affect. No depression or anxiety. No memory loss.  Neuro: no localizing neurological complaints, no tingling, no weakness, no double vision, no gait abnormality, no slurred speech, no confusion  Otherwise ROS are negative  except for above, 10 systems were reviewed  Past Medical History: Past Medical History  Diagnosis  Date  . Anxiety   . Depression   . Thrombocytopenia (HCC)   . Gastric ulcer   . Iron deficiency anemia    Past Surgical History  Procedure Laterality Date  . Cesarean section    . Cholecystectomy    . Appendectomy    . Gastric bypass  2011  . Colon surgery       Medications: Prior to Admission medications   Medication Sig Start Date End Date Taking? Authorizing Provider  acetaminophen (TYLENOL) 500 MG tablet Take 1,000 mg by mouth every 6 (six) hours as needed for mild pain, fever or headache.   Yes Historical Provider, MD  acidophilus (RISAQUAD) CAPS capsule Take 2 capsules by mouth daily. 02/06/16  Yes Enedina Finner, MD  aspirin EC 81 MG tablet Take 1 tablet (81 mg total) by mouth daily. 01/23/16  Yes Shaune Pollack, MD  calcium carbonate (TUMS - DOSED IN MG ELEMENTAL CALCIUM) 500 MG chewable tablet Chew 2 tablets by mouth as needed for indigestion or heartburn.   Yes Historical Provider, MD  Multiple Vitamin (MULTIVITAMIN WITH MINERALS) TABS tablet Take 1 tablet by mouth daily.   Yes Historical Provider, MD  ondansetron (ZOFRAN) 4 MG tablet Take 4 mg by mouth every 8 (eight) hours as needed for nausea or vomiting.    Yes Historical Provider, MD  pantoprazole (PROTONIX) 40 MG tablet Take 40 mg by mouth daily.    Yes Historical Provider, MD  sucralfate (CARAFATE) 1 GM/10ML suspension Take 1 g by mouth 3 (three) times daily with meals.   Yes Historical Provider, MD  piperacillin-tazobactam (ZOSYN) 3.375 GM/50ML IVPB Inject 50 mLs (3.375 g total) into the vein every 8 (eight) hours. 02/06/16   Enedina Finner, MD  Vancomycin (VANCOCIN) 750 MG/150ML SOLN Inject 150 mLs (750 mg total) into the vein every 12 (twelve) hours. 02/06/16   Enedina Finner, MD    Allergies:   Allergies  Allergen Reactions  . Phenobarbital Rash  . Sulfa Antibiotics Rash    Social History:  Ambulatory   independently   Lives at home   With family     reports that she has never smoked. She has never used smokeless  tobacco. She reports that she does not drink alcohol or use illicit drugs.    Family History: family history includes Anxiety disorder in her brother; Breast cancer in her mother; Hypertension in her brother and father; Prostate cancer in her father.    Physical Exam: Patient Vitals for the past 24 hrs:  BP Temp Temp src Pulse Resp SpO2 Height Weight  02/06/16 1734 (!) 141/86 mmHg (!) 100.5 F (38.1 C) Oral 87 16 98 % 5\' 2"  (1.575 m) 54.432 kg (120 lb)    1. General:  in No Acute distress 2. Psychological: Alert and   Oriented 3. Head/ENT:   Moist  Mucous Membranes                          Head Non traumatic, neck supple                          Normal  Dentition 4. SKIN: normal  Skin turgor,  Skin clean Dry and intact no rash 5. Heart: Regular rate and rhythm no Murmur, Rub or gallop 6. Lungs:  no wheezes some crackles  diminished breath sounds on the  left 7. Abdomen: Soft, non-tender, Non distended 8. Lower extremities: no clubbing, cyanosis, or edema 9. Neurologically Grossly intact, moving all 4 extremities equally 10. MSK: Normal range of motion  body mass index is 21.94 kg/(m^2).   Labs on Admission:   Results for orders placed or performed during the hospital encounter of 02/05/16 (from the past 24 hour(s))  C difficile quick scan w PCR reflex     Status: None   Collection Time: 02/06/16  6:00 AM  Result Value Ref Range   C Diff antigen NEGATIVE NEGATIVE   C Diff toxin NEGATIVE NEGATIVE   C Diff interpretation Negative for C. difficile   Basic metabolic panel     Status: Abnormal   Collection Time: 02/06/16  7:02 AM  Result Value Ref Range   Sodium 137 135 - 145 mmol/L   Potassium 3.8 3.5 - 5.1 mmol/L   Chloride 103 101 - 111 mmol/L   CO2 28 22 - 32 mmol/L   Glucose, Bld 81 65 - 99 mg/dL   BUN <5 (L) 6 - 20 mg/dL   Creatinine, Ser 9.14 0.44 - 1.00 mg/dL   Calcium 8.7 (L) 8.9 - 10.3 mg/dL   GFR calc non Af Amer >60 >60 mL/min   GFR calc Af Amer >60 >60 mL/min     Anion gap 6 5 - 15  CBC     Status: Abnormal   Collection Time: 02/06/16  7:02 AM  Result Value Ref Range   WBC 6.5 3.6 - 11.0 K/uL   RBC 3.74 (L) 3.80 - 5.20 MIL/uL   Hemoglobin 9.9 (L) 12.0 - 16.0 g/dL   HCT 78.2 (L) 95.6 - 21.3 %   MCV 81.4 80.0 - 100.0 fL   MCH 26.5 26.0 - 34.0 pg   MCHC 32.6 32.0 - 36.0 g/dL   RDW 08.6 (H) 57.8 - 46.9 %   Platelets 804 (H) 150 - 440 K/uL    UA  Not obtained  No results found for: HGBA1C  Estimated Creatinine Clearance: 72.5 mL/min (by C-G formula based on Cr of 0.47).  BNP (last 3 results) No results for input(s): PROBNP in the last 8760 hours.  Other results:  I have pearsonaly reviewed this: ECG REPORT  Not obtained  Filed Weights   02/06/16 1734  Weight: 54.432 kg (120 lb)     Cultures:    Component Value Date/Time   SDES BLOOD LEFT AC 02/05/2016 1030   SPECREQUEST BOTTLES DRAWN AEROBIC AND ANAEROBIC 0.5ML 02/05/2016 1030   CULT NO GROWTH < 24 HOURS 02/05/2016 1030   REPTSTATUS PENDING 02/05/2016 1030     Radiological Exams on Admission: US Thoracentesis Asp Pleural Space W/img Guide  02/05/2016  CLINICAL DATA:  Cavitary left lung pneumonia, parapneumonic effusion EXAM: ULTRASOUND GUIDED LEFT THORACENTESIS COMPARISON:  02/03/2016 PROCEDURE: An ultrasound guided thoracentesis was thoroughly discussed with the patient and questions answered. The benefits, risks, alternatives and complications were also discussed. The patient understands and wishes to proceed with the procedure. Written consent was obtained. Ultrasound was performed to localize and mark an adequate pocket of fluid in the right chest. The area was then prepped and draped in the normal sterile fashion. 1% Lidocaine was used for local anesthesia. Under ultrasound guidance a 19 gauge Yueh catheter was initially introduced. Fluid could not be aspirated. Pleural effusion appears complex by ultrasound. Needle position confirmed with ultrasound. Next, a second access  was performed at the interspace above. This also was done under sterile conditions and local anesthesia.  Catheter position confirmed in the pleural space with ultrasound. Syringe aspiration failed to yield any fluid. Next, a safety centesis needle catheter was utilized. This also was inserted under sterile conditions and local anesthesia into the pleural space on the left side. Position confirmed with ultrasound. Syringe aspiration yielded scant blood-tinged pleural fluid. After the third attempt, procedure was stopped. Sample was not sent for laboratory analysis. Complications:  None. FINDINGS: Ultrasound imaging confirms a complex hypoechoic small left effusion. Ultrasound thoracentesis performed however because of the complexity of the fluid only a scant 1-2 cc sample could be obtained. IMPRESSION: Successful ultrasound guided left thoracentesis yielding only a small amount of blood tinged left pleural fluid. Sample was not sent for laboratory analysis. Findings discussed with Dr. Thelma Barge. Electronically Signed   By: Judie Petit.  Shick M.D.   On: 02/05/2016 16:13    Chart has been reviewed  Family   at  Bedside  plan of care was discussed with father Asessment/Plan  46 year old female with history of necrotizing pneumonia complicated by lung abscess and empyema being admitted for IV antibiotics and cardiothoracic consult in the morning Present on Admission:  . Necrotizing pneumonia (HCC) with empyema - case was discussed at length with Dr. Cornelius Moras with cardiothoracic surgery who will see patient in consult tomorrow patient at this point appears to be stable and nontoxic. May need bronchoscopy to rule out obstructive mass and all foreign body. Reviewed cultures so far sputum cultures as been clear. We'll repeat sputum culture obtain HIV testing and QuantiFERON TB gold ascertain for completion patient is a former nurse although her presentation is more typical for bacterial necrotizing pneumonia.  . Hemoptysis  - at  this point appears to be mild, will hold Lovenox and aspirin, monitor on continuous pulse ox. If becomes more significant with being indication for early intervention  . Pleuritic chest pain supportive management with pain medication most likely secondary to empyema  . Empyema lung (HCC) as per cardiothoracic surgery . Iron deficiency anemia - will discontinue an aspirin  currently stable continue to monitor  . Primary thrombocytopenia (HCC) stable continue to monitor  . Chronic gastrojejunal ulcer -  and tinea protonic C. difficile negative. Hold aspirin     Prophylaxis:  SCDs CODE STATUS:  FULL CODE   as per patient    Disposition:   To home once workup is complete and patient is stable  Other plan as per orders.  I have spent a total of 69 min on this admission   extra time was spent to discuss case Dr Cornelius Moras with CT surgery  Rylend Pietrzak 02/06/2016, 8:17 PM   [facility]  Pager [phone]   after 2 AM please page floor coverage PA If 7AM-7PM, please contact the day team taking care of the patient  [email or web address]  Password [email or web address]

## 2016-02-06 NOTE — Discharge Summary (Signed)
[facility] - [place] at [facility]   PATIENT NAME: Summer Howard    MR#:  [id number]  DATE OF BIRTH:  09/26/1971  DATE OF ADMISSION:  02/05/2016 ADMITTING PHYSICIAN: Houston Siren, MD  DATE OF DISCHARGE: upon bed availability at [facility]  PRIMARY CARE PHYSICIAN: Rafael Bihari, MD    ADMISSION DIAGNOSIS:  Necrotizing pneumonia (HCC) [J85.0] Pleural effusion [J90] Empyema, left (HCC) [J86.9]  DISCHARGE DIAGNOSIS:  Large left upper lobe necrotizing pneumonia with lung abscess-patient awaiting transfer to [facility] for possible thoracotomy with debridement Anxiety/depression History of   Schatzki's ring SECONDARY DIAGNOSIS:   Past Medical History  Diagnosis Date  . Anxiety   . Depression   . Thrombocytopenia (HCC)   . Gastric ulcer   . Iron deficiency anemia     HOSPITAL COURSE:  45 year old female with past medical history of GERD, peptic ulcer disease, history of lap gastric bypass, anxiety/depression, chronic iron deficient anemia who presents to the hospital due to left-sided pleuritic chest pain and fever and noted to have a pneumonia.  #1 necrotizing pneumonia with pleural effusion-patient had a CT scan as an outpatient which showed a large pneumonia possible necrotizing in nature with lung abscess. -on broad-spectrum IV antibiotics with vancomycin, Zosyn. -Failed ultrasound-guided thoracentesis due to complexity of fluid  -Patient was seen by Dr. Thelma Barge from cardiac thoracic surgery recommends transfer to [facility] for debridement/thoracotomy -Clinically patient is afebrile and hemodynamically stable. -Patient on the transfer list awaiting a bed available at most -Blood cultures negative, stool negative for C. difficile  #2 hypokalemia repleted  #3 GERD-continue Protonix.  #4 anemia of chronic disease-patient has chronic iron deficiency anemia secondary to her lap gastric bypass. -Continue iron supplements. Hemoglobin  stable.  #5 thrombocytosis-reactive in nature and possibly related to underlying sepsis and pneumonia. -We'll follow platelet count.  #6venox for DVT prophylaxis  CONSULTS OBTAINED:  Treatment Team:  Clydie Braun, MD Hulda Marin, MD  DRUG ALLERGIES:   Allergies  Allergen Reactions  . Phenobarbital Rash  . Sulfa Antibiotics Rash    DISCHARGE MEDICATIONS:   Current Discharge Medication List    START taking these medications   Details  acidophilus (RISAQUAD) CAPS capsule Take 2 capsules by mouth daily. Qty: 30 capsule, Refills: 2    piperacillin-tazobactam (ZOSYN) 3.375 GM/50ML IVPB Inject 50 mLs (3.375 g total) into the vein every 8 (eight) hours. Qty: 50 mL, Refills: 2    Vancomycin (VANCOCIN) 750 MG/150ML SOLN Inject 150 mLs (750 mg total) into the vein every 12 (twelve) hours. Qty: 4000 mL, Refills: 0      CONTINUE these medications which have NOT CHANGED   Details  acetaminophen (TYLENOL) 500 MG tablet Take 1,000 mg by mouth every 6 (six) hours as needed for mild pain, fever or headache.    aspirin EC 81 MG tablet Take 1 tablet (81 mg total) by mouth daily. Qty: 30 tablet, Refills: 0    calcium carbonate (TUMS - DOSED IN MG ELEMENTAL CALCIUM) 500 MG chewable tablet Chew 2 tablets by mouth as needed for indigestion or heartburn.    Multiple Vitamin (MULTIVITAMIN WITH MINERALS) TABS tablet Take 1 tablet by mouth daily.    ondansetron (ZOFRAN) 4 MG tablet Take 4 mg by mouth every 8 (eight) hours as needed for nausea or vomiting.     pantoprazole (PROTONIX) 40 MG tablet Take 40 mg by mouth daily.     sucralfate (CARAFATE) 1 GM/10ML suspension Take 1 g by mouth 3 (three)  times daily with meals.      STOP taking these medications     amoxicillin-clavulanate (AUGMENTIN) 875-125 MG tablet         If you experience worsening of your admission symptoms, develop shortness of breath, life threatening emergency, suicidal or homicidal thoughts you must seek  medical attention immediately by calling 911 or calling your MD immediately  if symptoms less severe.  You Must read complete instructions/literature along with all the possible adverse reactions/side effects for all the Medicines you take and that have been prescribed to you. Take any new Medicines after you have completely understood and accept all the possible adverse reactions/side effects.   Please note  You were cared for by a hospitalist during your hospital stay. If you have any questions about your discharge medications or the care you received while you were in the hospital after you are discharged, you can call the unit and asked to speak with the hospitalist on call if the hospitalist that took care of you is not available. Once you are discharged, your primary care physician will handle any further medical issues. Please note that NO REFILLS for any discharge medications will be authorized once you are discharged, as it is imperative that you return to your primary care physician (or establish a relationship with a primary care physician if you do not have one) for your aftercare needs so that they can reassess your need for medications and monitor your lab values. Today   SUBJECTIVE     patient somewhat anxious and overwhelmed given her current medical condition  Father in the room  VITAL SIGNS:  Blood pressure 119/77, pulse 73, temperature 98.7 F (37.1 C), temperature source Oral, resp. rate 16, height 5\' 2"  (1.575 m), weight 54.432 kg (120 lb), SpO2 98 %.  I/O:   Intake/Output Summary (Last 24 hours) at 02/06/16 1120 Last data filed at 02/06/16 0800  Gross per 24 hour  Intake   2217 ml  Output   1175 ml  Net   1042 ml    PHYSICAL EXAMINATION:  GENERAL:  45 y.o.-year-old patient lying in the bed with no acute distress.  thin  EYES: Pupils equal, round, reactive to light and accommodation. No scleral icterus. Extraocular muscles intact.  HEENT: Head atraumatic,  normocephalic. Oropharynx and nasopharynx clear.  NECK:  Supple, no jugular venous distention. No thyroid enlargement, no tenderness.  LUNGS: Normal breath sounds bilaterally, no wheezing, rales,rhonchi or crepitation. No use of accessory muscles of respiration.  CARDIOVASCULAR: S1, S2 normal. No murmurs, rubs, or gallops.  ABDOMEN: Soft, non-tender, non-distended. Bowel sounds present. No organomegaly or mass.  EXTREMITIES: No pedal edema, cyanosis, or clubbing.  NEUROLOGIC: Cranial nerves II through XII are intact. Muscle strength 5/5 in all extremities. Sensation intact. Gait not checked.  PSYCHIATRIC: The patient is alert and oriented x 3.  SKIN: No obvious rash, lesion, or ulcer.   DATA REVIEW:   CBC   Recent Labs Lab 02/06/16 0702  WBC 6.5  HGB 9.9*  HCT 30.4*  PLT 804*    Chemistries   Recent Labs Lab 02/06/16 0702  NA 137  K 3.8  CL 103  CO2 28  GLUCOSE 81  BUN <5*  CREATININE 0.47  CALCIUM 8.7*    Microbiology Results   Recent Results (from the past 240 hour(s))  Blood culture (single)     Status: None (Preliminary result)   Collection Time: 02/05/16  9:30 AM  Result Value Ref Range Status   Specimen  Description BLOOD RIGHT ASSIST CONTROL  Final   Special Requests BOTTLES DRAWN AEROBIC AND ANAEROBIC 1CCAERO,1CCANA  Final   Culture NO GROWTH 1 DAY  Final   Report Status PENDING  Incomplete  Culture, blood (routine x 2)     Status: None (Preliminary result)   Collection Time: 02/05/16 10:27 AM  Result Value Ref Range Status   Specimen Description BLOOD RIGHT HAND  Final   Special Requests BOTTLES DRAWN AEROBIC AND ANAEROBIC 0.5ML  Final   Culture NO GROWTH < 24 HOURS  Final   Report Status PENDING  Incomplete  Culture, blood (routine x 2)     Status: None (Preliminary result)   Collection Time: 02/05/16 10:30 AM  Result Value Ref Range Status   Specimen Description BLOOD LEFT AC  Final   Special Requests BOTTLES DRAWN AEROBIC AND ANAEROBIC 0.5ML   Final   Culture NO GROWTH < 24 HOURS  Final   Report Status PENDING  Incomplete  C difficile quick scan w PCR reflex     Status: None   Collection Time: 02/06/16  6:00 AM  Result Value Ref Range Status   C Diff antigen NEGATIVE NEGATIVE Final   C Diff toxin NEGATIVE NEGATIVE Final   C Diff interpretation Negative for C. difficile  Final    RADIOLOGY:  US Thoracentesis Asp Pleural Space W/img Guide  02/05/2016  CLINICAL DATA:  Cavitary left lung pneumonia, parapneumonic effusion EXAM: ULTRASOUND GUIDED LEFT THORACENTESIS COMPARISON:  02/03/2016 PROCEDURE: An ultrasound guided thoracentesis was thoroughly discussed with the patient and questions answered. The benefits, risks, alternatives and complications were also discussed. The patient understands and wishes to proceed with the procedure. Written consent was obtained. Ultrasound was performed to localize and mark an adequate pocket of fluid in the right chest. The area was then prepped and draped in the normal sterile fashion. 1% Lidocaine was used for local anesthesia. Under ultrasound guidance a 19 gauge Yueh catheter was initially introduced. Fluid could not be aspirated. Pleural effusion appears complex by ultrasound. Needle position confirmed with ultrasound. Next, a second access was performed at the interspace above. This also was done under sterile conditions and local anesthesia. Catheter position confirmed in the pleural space with ultrasound. Syringe aspiration failed to yield any fluid. Next, a safety centesis needle catheter was utilized. This also was inserted under sterile conditions and local anesthesia into the pleural space on the left side. Position confirmed with ultrasound. Syringe aspiration yielded scant blood-tinged pleural fluid. After the third attempt, procedure was stopped. Sample was not sent for laboratory analysis. Complications:  None. FINDINGS: Ultrasound imaging confirms a complex hypoechoic small left effusion.  Ultrasound thoracentesis performed however because of the complexity of the fluid only a scant 1-2 cc sample could be obtained. IMPRESSION: Successful ultrasound guided left thoracentesis yielding only a small amount of blood tinged left pleural fluid. Sample was not sent for laboratory analysis. Findings discussed with Dr. Thelma Barge. Electronically Signed   By: Judie Petit.  Shick M.D.   On: 02/05/2016 16:13     Management plans discussed with the patient, family and they are in agreement.  CODE STATUS:     Code Status Orders        Start     Ordered   02/05/16 1344  Full code   Continuous     02/05/16 1343    Code Status History    Date Active Date Inactive Code Status Order ID Comments User Context   01/20/2016  8:48 PM 01/23/2016  7:10  PM Full Code [id number]  Altamese Dilling, MD Inpatient      TOTAL TIME TAKING CARE OF THIS PATIENT: 40 minutes.    Karell Tukes M.D on 02/06/2016 at 11:20 AM  Between 7am to 6pm - Pager - [phone] After 6pm go to [email or web address] - password [email or web address] [facility]  [facility]  Office  [phone]  CC: Primary care physician; Rafael Bihari, MD

## 2016-02-07 ENCOUNTER — Encounter (HOSPITAL_COMMUNITY): Payer: Self-pay | Admitting: Thoracic Surgery (Cardiothoracic Vascular Surgery)

## 2016-02-07 DIAGNOSIS — E46 Unspecified protein-calorie malnutrition: Secondary | ICD-10-CM | POA: Diagnosis present

## 2016-02-07 DIAGNOSIS — J851 Abscess of lung with pneumonia: Secondary | ICD-10-CM

## 2016-02-07 LAB — CBC WITH DIFFERENTIAL/PLATELET
BASOS ABS: 0 10*3/uL (ref 0.0–0.1)
BASOS PCT: 1 %
EOS ABS: 0 10*3/uL (ref 0.0–0.7)
EOS PCT: 1 %
HCT: 27.2 % — ABNORMAL LOW (ref 36.0–46.0)
Hemoglobin: 8.4 g/dL — ABNORMAL LOW (ref 12.0–15.0)
Lymphocytes Relative: 18 %
Lymphs Abs: 0.8 10*3/uL (ref 0.7–4.0)
MCH: 26.3 pg (ref 26.0–34.0)
MCHC: 30.9 g/dL (ref 30.0–36.0)
MCV: 85 fL (ref 78.0–100.0)
Monocytes Absolute: 0.6 10*3/uL (ref 0.1–1.0)
Monocytes Relative: 13 %
NEUTROS PCT: 67 %
Neutro Abs: 2.9 10*3/uL (ref 1.7–7.7)
PLATELETS: 552 10*3/uL — AB (ref 150–400)
RBC: 3.2 MIL/uL — AB (ref 3.87–5.11)
RDW: 13.8 % (ref 11.5–15.5)
WBC: 4.3 10*3/uL (ref 4.0–10.5)

## 2016-02-07 LAB — MAGNESIUM: MAGNESIUM: 1.8 mg/dL (ref 1.7–2.4)

## 2016-02-07 LAB — VANCOMYCIN, TROUGH

## 2016-02-07 LAB — STREP PNEUMONIAE URINARY ANTIGEN: Strep Pneumo Urinary Antigen: NEGATIVE

## 2016-02-07 LAB — COMPREHENSIVE METABOLIC PANEL
ALT: 11 U/L — AB (ref 14–54)
AST: 16 U/L (ref 15–41)
Albumin: 1.9 g/dL — ABNORMAL LOW (ref 3.5–5.0)
Alkaline Phosphatase: 96 U/L (ref 38–126)
Anion gap: 9 (ref 5–15)
BILIRUBIN TOTAL: 0.5 mg/dL (ref 0.3–1.2)
CO2: 23 mmol/L (ref 22–32)
CREATININE: 0.62 mg/dL (ref 0.44–1.00)
Calcium: 8.3 mg/dL — ABNORMAL LOW (ref 8.9–10.3)
Chloride: 107 mmol/L (ref 101–111)
Glucose, Bld: 85 mg/dL (ref 65–99)
Potassium: 3.9 mmol/L (ref 3.5–5.1)
Sodium: 139 mmol/L (ref 135–145)
TOTAL PROTEIN: 5.8 g/dL — AB (ref 6.5–8.1)

## 2016-02-07 LAB — URINALYSIS, ROUTINE W REFLEX MICROSCOPIC
Bilirubin Urine: NEGATIVE
Glucose, UA: NEGATIVE mg/dL
Hgb urine dipstick: NEGATIVE
KETONES UR: NEGATIVE mg/dL
LEUKOCYTES UA: NEGATIVE
Nitrite: NEGATIVE
PROTEIN: NEGATIVE mg/dL
Specific Gravity, Urine: 1.004 — ABNORMAL LOW (ref 1.005–1.030)
pH: 6.5 (ref 5.0–8.0)

## 2016-02-07 LAB — TSH: TSH: 3.098 u[IU]/mL (ref 0.350–4.500)

## 2016-02-07 LAB — HIV ANTIBODY (ROUTINE TESTING W REFLEX): HIV SCREEN 4TH GENERATION: NONREACTIVE

## 2016-02-07 LAB — PHOSPHORUS: Phosphorus: 3.6 mg/dL (ref 2.5–4.6)

## 2016-02-07 MED ORDER — HYDROMORPHONE HCL 1 MG/ML IJ SOLN
0.5000 mg | INTRAMUSCULAR | Status: DC | PRN
  Administered 2016-02-07 – 2016-02-10 (×15): 0.5 mg via INTRAVENOUS
  Filled 2016-02-07 (×15): qty 1

## 2016-02-07 MED ORDER — ONDANSETRON HCL 4 MG PO TABS
4.0000 mg | ORAL_TABLET | Freq: Three times a day (TID) | ORAL | Status: DC | PRN
  Administered 2016-02-07 – 2016-02-11 (×4): 4 mg via ORAL
  Filled 2016-02-07 (×4): qty 1

## 2016-02-07 MED ORDER — VANCOMYCIN HCL 500 MG IV SOLR
500.0000 mg | Freq: Once | INTRAVENOUS | Status: AC
  Administered 2016-02-07: 500 mg via INTRAVENOUS
  Filled 2016-02-07: qty 500

## 2016-02-07 MED ORDER — SODIUM CHLORIDE 0.9% FLUSH
10.0000 mL | INTRAVENOUS | Status: DC | PRN
  Administered 2016-02-07 – 2016-02-17 (×6): 10 mL
  Filled 2016-02-07 (×5): qty 40

## 2016-02-07 MED ORDER — BOOST / RESOURCE BREEZE PO LIQD
1.0000 | Freq: Three times a day (TID) | ORAL | Status: DC
  Administered 2016-02-07 – 2016-02-19 (×28): 1 via ORAL

## 2016-02-07 MED ORDER — VANCOMYCIN HCL IN DEXTROSE 1-5 GM/200ML-% IV SOLN
1000.0000 mg | Freq: Two times a day (BID) | INTRAVENOUS | Status: DC
  Administered 2016-02-08 – 2016-02-11 (×8): 1000 mg via INTRAVENOUS
  Filled 2016-02-07 (×9): qty 200

## 2016-02-07 MED ORDER — FE FUMARATE-B12-VIT C-FA-IFC PO CAPS
1.0000 | ORAL_CAPSULE | Freq: Three times a day (TID) | ORAL | Status: DC
  Administered 2016-02-07 – 2016-02-19 (×32): 1 via ORAL
  Filled 2016-02-07 (×46): qty 1

## 2016-02-07 MED ORDER — ZOLPIDEM TARTRATE 5 MG PO TABS
5.0000 mg | ORAL_TABLET | Freq: Every evening | ORAL | Status: DC | PRN
  Administered 2016-02-07 – 2016-02-18 (×11): 5 mg via ORAL
  Filled 2016-02-07 (×11): qty 1

## 2016-02-07 NOTE — Progress Notes (Signed)
PROGRESS NOTE    Summer Howard  AVW:[id number]  DOB: 03-26-1971  DOA: 02/06/2016 PCP: Rafael Bihari, MD Outpatient Specialists:   Hospital course: 45 year old female patient with history of anxiety, depression, iron deficiency anemia, thrombocytopenia, transferred from [facility] for evaluation of lung abscess by cardiothoracic surgery at [facility].  Assessment & Plan:   Community-acquired pneumonia complicated by lung abscess - Cardiothoracic surgery input appreciated. Recommend flexible fiberoptic bronchoscopy first to rule out airway obstruction. - Continue broad-spectrum IV antibiotics and pulmonary toilet - Nutrition consultation. - Pain management for pleuritic chest pain.  Hemoptysis - Mild and likely secondary to lung abscess. Holding Lovenox and aspirin. Monitor closely.  Iron deficiency anemia - Aspirin currently held. Monitor CBCs periodically and transfer if hemoglobin <7 g per DL.  Thrombocytosis  - ? Reactive.   History of gastric ulcer - PPI.  DVT prophylaxis: SCD's Code Status: Full Family Communication: Discussed with stepmother at bedside on 2/25.  Disposition Plan: DC home when medically stable   Consultants:  Cardiothoracic surgery   Procedures:  None   Antimicrobials:  IV Zosyn    IV vancomycin  Subjective: Left lower posterior chest pain. No productive cough or hemoptysis reported.   Objective: Filed Vitals:   02/07/16 0040 02/07/16 0522 02/07/16 1419 02/07/16 1726  BP: 120/71 126/77 119/84   Pulse: 79 75 92   Temp: 98.6 F (37 C) 98.9 F (37.2 C) 99.2 F (37.3 C) 99.2 F (37.3 C)  TempSrc: Oral  Oral Axillary  Resp: Height:      Weight:      SpO2: 99% 98% 98%     Intake/Output Summary (Last 24 hours) at 02/07/16 1852 Last data filed at 02/07/16 1421  Gross per 24 hour  Intake   1020 ml  Output    750 ml  Net    270 ml   Filed Weights   02/06/16 1734  Weight: 54.432 kg (120 lb)    Exam:  General exam:  Small built and frail young female sitting up comfortably in bed Respiratory system:  Reduced breath sounds left lung fields otherwise clear to auscultation. No increased work of breathing. Cardiovascular system: S1 & S2 heard, RRR. No JVD, murmurs, gallops, clicks or pedal edema. Telemetry: Sinus rhythm.  Gastrointestinal system: Abdomen is nondistended, soft and nontender. Normal bowel sounds heard. Central nervous system: Alert and oriented. No focal neurological deficits. Extremities: Symmetric 5 x 5 power.   Data Reviewed: Basic Metabolic Panel:  Recent Labs Lab 02/05/16 0930 02/06/16 0702 02/07/16 0300  NA 140 137 139  K 2.9* 3.8 3.9  CL 105 103 107  CO2 GLUCOSE 54* 81 85  BUN <5* <5* <5*  CREATININE 0.50 0.47 0.62  CALCIUM 8.5* 8.7* 8.3*  MG  --   --  1.8  PHOS  --   --  3.6   Liver Function Tests:  Recent Labs Lab 02/07/16 0300  AST 16  ALT 11*  ALKPHOS 96  BILITOT 0.5  PROT 5.8*  ALBUMIN 1.9*   No results for input(s): LIPASE, AMYLASE in the last 168 hours. No results for input(s): AMMONIA in the last 168 hours. CBC:  Recent Labs Lab 02/05/16 0930 02/06/16 0702 02/07/16 0300  WBC 7.4 6.5 4.3  NEUTROABS  --   --  2.9  HGB 9.4* 9.9* 8.4*  HCT 28.3* 30.4* 27.2*  MCV 81.7 81.4 85.0  PLT 817* 804* 552*   Cardiac Enzymes: No results  for input(s): CKTOTAL, CKMB, CKMBINDEX, TROPONINI in the last 168 hours. BNP (last 3 results) No results for input(s): PROBNP in the last 8760 hours. CBG: No results for input(s): GLUCAP in the last 168 hours.  Recent Results (from the past 240 hour(s))  Blood culture (single)     Status: None (Preliminary result)   Collection Time: 02/05/16  9:30 AM  Result Value Ref Range Status   Specimen Description BLOOD RIGHT ASSIST CONTROL  Final   Special Requests BOTTLES DRAWN AEROBIC AND ANAEROBIC 1CCAERO,1CCANA  Final   Culture NO GROWTH 2 DAYS  Final   Report Status PENDING  Incomplete  Culture, blood (routine  x 2)     Status: None (Preliminary result)   Collection Time: 02/05/16 10:27 AM  Result Value Ref Range Status   Specimen Description BLOOD RIGHT HAND  Final   Special Requests BOTTLES DRAWN AEROBIC AND ANAEROBIC 0.5ML  Final   Culture NO GROWTH 2 DAYS  Final   Report Status PENDING  Incomplete  Culture, blood (routine x 2)     Status: None (Preliminary result)   Collection Time: 02/05/16 10:30 AM  Result Value Ref Range Status   Specimen Description BLOOD LEFT AC  Final   Special Requests BOTTLES DRAWN AEROBIC AND ANAEROBIC 0.5ML  Final   Culture NO GROWTH 2 DAYS  Final   Report Status PENDING  Incomplete  C difficile quick scan w PCR reflex     Status: None   Collection Time: 02/06/16  6:00 AM  Result Value Ref Range Status   C Diff antigen NEGATIVE NEGATIVE Final   C Diff toxin NEGATIVE NEGATIVE Final   C Diff interpretation Negative for C. difficile  Final         Studies: No results found.      Scheduled Meds: . acidophilus  2 capsule Oral Daily  . feeding supplement  1 Container Oral TID BM  . ferrous fumarate-b12-vitamic C-folic acid  1 capsule Oral TID PC  . pantoprazole  40 mg Oral Daily  . piperacillin-tazobactam (ZOSYN)  IV  3.375 g Intravenous Q8H  . saccharomyces boulardii  250 mg Oral BID  . sodium chloride flush  3 mL Intravenous Q12H  . sucralfate  1 g Oral TID WC  . [START ON 02/08/2016] vancomycin  1,000 mg Intravenous Q12H   Continuous Infusions:   Principal Problem:   Lung abscess (HCC) Active Problems:   Chronic gastrojejunal ulcer   Primary thrombocytopenia (HCC)   Pleuritic chest pain   Necrotizing pneumonia (HCC)   Pleural effusion   Iron deficiency anemia   Hemoptysis   CAP (community acquired pneumonia)   Protein-calorie malnutrition (HCC)    Time spent: 30 minutes.    Marcellus Scott, MD, FACP, FHM. [facility] Pager [phone]  If 7PM-7AM, please contact night-coverage [email or web address] Password [facility] 02/07/2016,  6:52 PM    LOS: 1 day

## 2016-02-07 NOTE — Consult Note (Addendum)
[address]       [place] [address]             [phone]          CARDIOTHORACIC SURGERY CONSULTATION REPORT  PCP is Danne Harbor, Letta Pate, MD Referring Provider is Hulda Marin, MD  Reason for consultation:  Lung Abscess  HPI:  Patient is a 45 year old female with history of previous gastric bypass surgery, colon resection for sigmoid volvulus, chronic gastrojejunal ulcer, chronic iron deficient anemia, and malnutrition who has been referred by Dr. Thelma Barge and transferred to [facility] for management of lung abscess. The patient states that she was in her usual state of health until early this month when she developed symptoms suggestive of upper respiratory tract infection. Within a few days she developed sudden onset fever, productive cough, and pleuritic left-sided chest pain. She initially went to urgent care and was given a prescription for Avelox. Symptoms did not improve and she was seen by her primary care physician who subsequently admitted her to [facility] from 01/20/2016 through 01/23/2016 where she was treated for community acquired pneumonia.  CXR performed 01/21/2016 revealed findings consistent with pneumonia involving the lingular segment of the left upper lobe and left lower lobe.  She initially was treated with intravenous Rocephin and azithromycin and was subsequently changed to oral Augmentin. Sputum cultures were felt to be consistent with "normal respiratory flora."  During her admission she had borderline elevated troponin levels which led to a cardiology consult.  An echocardiogram was performed and notable for normal left ventricular systolic function and no other significant abnormalities. The patient was felt to be at low risk for ischemic heart disease.  Following hospital discharge the patient states that she never really got any better. She continued to experience intermittent cough that was occasionally productive of bloody sputum.  A time she had small amounts of bright red blood. Her pleuritic chest pain improved on antibiotic therapy, but when her prescription for Augmentin completed her symptoms immediately began to get worse. She experienced intermittent low-grade fevers. She return to her primary care physician who obtained a chest CT scan with intravenous contrast on 02/03/2016. This revealed findings consistent with necrotizing pneumonia with cavitation involving the left upper lobe, primarily in the lingular segment.  The patient was readmitted to [facility] and thoracic surgical consultation was requested.  The patient was evaluated by Dr. Thelma Barge.  An attempt at ultrasound-guided thoracentesis of left pleural effusion was performed but no significant fluid was obtained. The patient was subsequently transferred to [facility] for further management.  The patient is widowed and lives in [place] with her 3 teenage sons. She has recently been working part-time jobs as a Designer, jewellery. Prior to her current illness she reports no significant physical limitations. She exercises regularly. She admits that she suffers from chronic anxiety and depression but symptoms have been stable and under good control until her recent acute illness. At present she reports persistent left sided pleuritic chest pain that is located primarily in the left posterior chest. She has had low-grade fevers and intermittent cough. She has not been coughing up any any bright red blood for the past several days, although she states that occasionally she has some dark brownish-looking sputum. She denies any known exposure to patients with tuberculosis or other respiratory infections. She has not traveled outside the [place]. She has not does not recall any recent episodes suspicious for aspiration of  food, loose teeth, or other foreign bodies.  She is a nonsmoker. There is no family history of lung cancer.   Past Medical History  Diagnosis  Date  . Anxiety   . Depression   . Thrombocytopenia (HCC)   . Gastric ulcer   . Iron deficiency anemia   . 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 Malnutrition following gastrointestinal surgery 10/22/2014  . 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 Lung abscess (HCC) 02/03/2016  . CAP (community acquired pneumonia) 01/21/2016  . Protein-calorie malnutrition [facility])     Past Surgical History  Procedure Laterality Date  . Cesarean section    . Cholecystectomy    . Appendectomy    . Gastric bypass  2011  . Colon surgery      resection of sigmoid volvulus    Family History  Problem Relation Age of Onset  . Breast cancer Mother   . Hypertension Father   . Prostate cancer Father   . Hypertension Brother   . Anxiety disorder Brother     Social History   Social History  . Marital Status: Widowed    Spouse Name: N/A  . Number of Children: N/A  . Years of Education: N/A   Occupational History  . Not on file.   Social History Main Topics  . Smoking status: Never Smoker   . Smokeless tobacco: Never Used  . Alcohol Use: No  . Drug Use: No  . Sexual Activity: No   Other Topics Concern  . Not on file   Social History Narrative    Prior to Admission medications   Medication Sig Start Date End Date Taking? Authorizing Provider  acetaminophen (TYLENOL) 500 MG tablet Take 1,000 mg by mouth every 6 (six) hours as needed for mild pain, fever or headache.   Yes Historical Provider, MD  acidophilus (RISAQUAD) CAPS capsule Take 2 capsules by mouth daily. 02/06/16  Yes Enedina Finner, MD  aspirin EC 81 MG tablet Take 1 tablet (81 mg total) by mouth daily. 01/23/16  Yes Shaune Pollack, MD  calcium carbonate (TUMS - DOSED IN MG ELEMENTAL CALCIUM) 500 MG chewable tablet Chew 2 tablets by mouth as needed for indigestion or heartburn.   Yes Historical Provider, MD  Multiple Vitamin (MULTIVITAMIN WITH MINERALS) TABS  tablet Take 1 tablet by mouth daily.   Yes Historical Provider, MD  ondansetron (ZOFRAN) 4 MG tablet Take 4 mg by mouth every 8 (eight) hours as needed for nausea or vomiting.    Yes Historical Provider, MD  pantoprazole (PROTONIX) 40 MG tablet Take 40 mg by mouth daily.    Yes Historical Provider, MD  sucralfate (CARAFATE) 1 GM/10ML suspension Take 1 g by mouth 3 (three) times daily with meals.   Yes Historical Provider, MD  piperacillin-tazobactam (ZOSYN) 3.375 GM/50ML IVPB Inject 50 mLs (3.375 g total) into the vein every 8 (eight) hours. 02/06/16   Enedina Finner, MD  Vancomycin (VANCOCIN) 750 MG/150ML SOLN Inject 150 mLs (750 mg total) into the vein every 12 (twelve) hours. 02/06/16   Enedina Finner, MD    Current Facility-Administered Medications  Medication Dose Route Frequency Provider Last Rate Last Dose  . 0.9 %  sodium chloride infusion  250 mL Intravenous PRN Therisa Doyne, MD      . acidophilus (RISAQUAD) capsule 2 capsule  2 capsule Oral Daily Therisa Doyne, MD   2 capsule at 02/07/16 5621  . loperamide (IMODIUM) capsule 2 mg  2 mg Oral PRN Therisa Doyne, MD      . metoCLOPramide (REGLAN) injection 5 mg  5 mg Intravenous Q6H PRN Therisa Doyne, MD      . ondansetron (ZOFRAN) tablet 4 mg  4 mg Oral Q8H PRN Elease Etienne, MD   4 mg at 02/07/16 1002  . oxyCODONE-acetaminophen (PERCOCET/ROXICET) 5-325 MG per tablet 1-2 tablet  1-2 tablet Oral Q4H PRN Therisa Doyne, MD   2 tablet at 02/07/16 [phone]  . pantoprazole (PROTONIX) EC tablet 40 mg  40 mg Oral Daily Therisa Doyne, MD   40 mg at 02/07/16 0938  . piperacillin-tazobactam (ZOSYN) IVPB 3.375 g  3.375 g Intravenous Q8H Nishant Dhungel, MD   3.375 g at 02/07/16 0610  . saccharomyces boulardii (FLORASTOR) capsule 250 mg  250 mg Oral BID Therisa Doyne, MD   250 mg at 02/07/16 9604  . sodium chloride flush (NS) 0.9 % injection 3 mL  3 mL Intravenous Q12H Therisa Doyne, MD   3 mL at 02/06/16 2200  . sodium  chloride flush (NS) 0.9 % injection 3 mL  3 mL Intravenous PRN Therisa Doyne, MD      . sucralfate (CARAFATE) 1 GM/10ML suspension 1 g  1 g Oral TID WC Therisa Doyne, MD   1 g at 02/07/16 0841  . vancomycin (VANCOCIN) IVPB 750 mg/150 ml premix  750 mg Intravenous Q12H Nishant Dhungel, MD   750 mg at 02/06/16 2209    Allergies  Allergen Reactions  . Phenobarbital Rash  . Sulfa Antibiotics Rash      Review of Systems:   General:  normal appetite, decreased energy, no weight gain, no weight loss, + fever  Cardiac:  no chest pain with exertion, no chest pain at rest, no SOB with exertion, no resting SOB, no PND, no orthopnea, no palpitations, no arrhythmia, no atrial fibrillation, no LE edema, no dizzy spells, no syncope  Respiratory:  no shortness of breath, no home oxygen, + productive cough, + dry cough, + bronchitis, no wheezing, + hemoptysis, no asthma, + pain with inspiration or cough, no sleep apnea, no CPAP at night  GI:   no difficulty swallowing, no reflux, no frequent heartburn, no hiatal hernia, no abdominal pain, no constipation, + diarrhea, no hematochezia, no hematemesis, no melena  GU:   no dysuria,  no frequency, no urinary tract infection, no hematuria, no kidney stones, no kidney disease  Vascular:  no pain suggestive of claudication, no pain in feet, no leg cramps, no varicose veins, no DVT, no non-healing foot ulcer  Neuro:   no stroke, no TIA's, no seizures, no headaches, no temporary blindness one eye,  no slurred speech, no peripheral neuropathy, no chronic pain, no instability of gait, no memory/cognitive dysfunction  Musculoskeletal: no arthritis, no joint swelling, no myalgias, no difficulty walking, normal mobility   Skin:   no rash, no itching, no skin infections, no pressure sores or ulcerations  Psych:   + anxiety, + depression, + nervousness, no unusual recent stress  Eyes:   no blurry vision, no floaters, no recent vision changes, + wears glasses or  contacts  ENT:   no hearing loss, no loose or painful teeth, no dentures, last saw dentist within the past year  Hematologic:  no easy bruising, no abnormal bleeding, no clotting disorder, no frequent epistaxis  Endocrine:  no diabetes, does not check CBG's at home     Physical Exam:   BP 126/77 mmHg  Pulse 75  Temp(Src) 98.9 F (  37.2 C) (Oral)  Resp 16  Ht  (1.575 m)  Wt 120 lb (54.432 kg)  BMI 21.94 kg/m2  SpO2 98%  General:  Thin,  well-appearing  HEENT:  Unremarkable   Neck:   no JVD, no bruits, no adenopathy   Chest:   Diminished breath sounds left side, no wheezes, few scattered rhonchi   CV:   RRR, no murmur   Abdomen:  soft, non-tender, no masses   Extremities:  warm, well-perfused, pulses diminished but palpable, no lower extremity edema  Rectal/GU  Deferred  Neuro:   Grossly non-focal and symmetrical throughout  Skin:   Clean and dry, no rashes, no breakdown  Diagnostic Tests:  Recent Labs  02/06/16 0702 02/07/16 0300  WBC 6.5 4.3  HGB 9.9* 8.4*  HCT 30.4* 27.2*  PLT 804* 552*    02/06/16 0702 02/07/16 0300  NA 137 139  K 3.8 3.9  CL 103 107  CO2 28 23  GLUCOSE 81 85  BUN <5* <5*  CREATININE 0.47 0.62  CALCIUM 8.7* 8.3*    CHEST 2 VIEW  COMPARISON: None.  FINDINGS: There is airspace consolidation throughout much of the left lower lobe as well as portions of the lingula. Right lung is clear. Heart size and pulmonary vascularity are normal. No adenopathy. There is a small bone island in the right proximal humerus.  IMPRESSION: Extensive pneumonia involving portions of the left lower lobe and lingula. Right lung clear. Cardiac silhouette within normal limits.  Followup PA and lateral chest radiographs recommended in 3-4 weeks following trial of antibiotic therapy to ensure resolution and exclude underlying malignancy.   Electronically Signed  By: Bretta Bang III M.D.  On: 01/21/2016 07:18    CT CHEST WITH  CONTRAST  TECHNIQUE: Multidetector CT imaging of the chest was performed during intravenous contrast administration.  CONTRAST: 75mL OMNIPAQUE IOHEXOL 300 MG/ML SOLN  COMPARISON: Chest x-ray of 01/21/2016  FINDINGS: There is airspace disease throughout the inferior lateral aspect of the left upper lobe extending peripherally with areas of cavitation most consistent with necrotizing pneumonia, generally bacterial or viral in origin. Some involvement of the superior segment of the left lower lobe also is noted. Minimal opacity is noted within the anterior inferior right upper lobe on image 26, and in the posterior right lower lobe on image 35. These may represent areas of early infiltrate as well. There is a moderate size left pleural effusion present. The right lung is clear. The central airway is patent.  On soft tissue window images, the thyroid gland is unremarkable. On this enhanced study, the thoracic aorta opacifies with no significant abnormality noted. The pulmonary arteries are not as well opacified but no central abnormality is evident. No mediastinal or hilar adenopathy is seen. Surgical sutures are noted from prior gastric bypass surgery. Clips are present from prior cholecystectomy. The thoracic vertebrae are in normal alignment with normal disc spaces.  IMPRESSION: 1. Extensive parenchymal infiltrate throughout the left upper lobe and lingula as well as the left lower lobe with areas of cavitation primarily involving the inferior left upper lobe and lingula. In view of the extensive involvement, necrotizing pneumonia is the primary consideration generally bacterial or fungal in origin. Patchy areas of involvement also are noted in the right upper and right lower lobes. 2. Moderate size left pleural effusion.   Electronically Signed  By: Dwyane Dee M.D.  On: 02/04/2016 08:17   Microbiology Results:  Recent Results (from the past 720 hour(s))    Culture, expectorated  sputum-assessment     Status: None   Collection Time: 01/20/16  8:00 PM  Result Value Ref Range Status   Specimen Description SPUTUM  Final   Special Requests Normal  Final   Sputum evaluation THIS SPECIMEN IS ACCEPTABLE FOR SPUTUM CULTURE  Final   Report Status 01/21/2016 FINAL  Final  Culture, respiratory (NON-Expectorated)     Status: None   Collection Time: 01/20/16  8:00 PM  Result Value Ref Range Status   Specimen Description SPUTUM  Final   Special Requests Normal Reflexed from [id number]  Final   Culture Consistent with normal respiratory flora.  Final   Report Status 01/24/2016 FINAL  Final  MRSA PCR Screening     Status: None   Collection Time: 01/20/16  9:32 PM  Result Value Ref Range Status   MRSA by PCR NEGATIVE NEGATIVE Final    Comment:        The GeneXpert MRSA Assay (FDA approved for NASAL specimens only), is one component of a comprehensive MRSA colonization surveillance program. It is not intended to diagnose MRSA infection nor to guide or monitor treatment for MRSA infections.   Blood culture (single)     Status: None (Preliminary result)   Collection Time: 02/05/16  9:30 AM  Result Value Ref Range Status   Specimen Description BLOOD RIGHT ASSIST CONTROL  Final   Special Requests BOTTLES DRAWN AEROBIC AND ANAEROBIC 1CCAERO,1CCANA  Final   Culture NO GROWTH 1 DAY  Final   Report Status PENDING  Incomplete  Culture, blood (routine x 2)     Status: None (Preliminary result)   Collection Time: 02/05/16 10:27 AM  Result Value Ref Range Status   Specimen Description BLOOD RIGHT HAND  Final   Special Requests BOTTLES DRAWN AEROBIC AND ANAEROBIC 0.5ML  Final   Culture NO GROWTH < 24 HOURS  Final   Report Status PENDING  Incomplete  Culture, blood (routine x 2)     Status: None (Preliminary result)   Collection Time: 02/05/16 10:30 AM  Result Value Ref Range Status   Specimen Description BLOOD LEFT AC  Final   Special Requests BOTTLES  DRAWN AEROBIC AND ANAEROBIC 0.5ML  Final   Culture NO GROWTH < 24 HOURS  Final   Report Status PENDING  Incomplete  C difficile quick scan w PCR reflex     Status: None   Collection Time: 02/06/16  6:00 AM  Result Value Ref Range Status   C Diff antigen NEGATIVE NEGATIVE Final   C Diff toxin NEGATIVE NEGATIVE Final   C Diff interpretation Negative for C. difficile  Final    Impression:  Patient has history consistent with community acquired pneumonia complicated by lung abscess. There is no history suggestive of chronic airway obstruction, aspiration, lung cancer, tuberculosis, or other unusual respiratory illness.  I have personally reviewed the patient's recent chest x-ray and chest CT scan. Findings are consistent with lung abscess involving the lingular segment of the left upper lobe.    I recommend that the patient should undergo flexible fiberoptic bronchoscopy as an initial step to rule out airway obstruction and obtain bronchial washings for culture. In the absence of airway obstruction it remains possible that this patient's lung abscess could be treated with CT-guided chest tube placement. There is no question that she might ultimately require more radical surgical intervention, but I do not feel that left thoracotomy for lung resection and muscle flap reconstruction should be the first line of therapy.     Plan:  I have discussed matters at length with the patient and her father at the bedside. Alternative treatment strategies been discussed. We will make plans for bronchoscopy under general anesthesia as soon as practical. In the meanwhile the patient should remain on broad-spectrum intravenous antibiotics. She needs nutrition consult for protein calorie supplementation. Aggressive measures to improve pulmonary toilet should be undertaken. All of their questions have been addressed.   I spent in excess of 60 minutes during the conduct of this hospital consultation and >50% of this  time involved direct face-to-face encounter for counseling and/or coordination of the patient's care.    Salvatore Decent. Cornelius Moras, MD 02/07/2016 11:11 AM

## 2016-02-07 NOTE — Progress Notes (Signed)
Peripherally Inserted Central Catheter/Midline Placement  The IV Nurse has discussed with the patient and/or persons authorized to consent for the patient, the purpose of this procedure and the potential benefits and risks involved with this procedure.  The benefits include less needle sticks, lab draws from the catheter and patient may be discharged home with the catheter.  Risks include, but not limited to, infection, bleeding, blood clot (thrombus formation), and puncture of an artery; nerve damage and irregular heat beat.  Alternatives to this procedure were also discussed.  PICC/Midline Placement Documentation  PICC Double Lumen 02/07/16 PICC Right Basilic 35 cm 0 cm (Active)  Indication for Insertion or Continuance of Line Administration of hyperosmolar/irritating solutions (i.e. TPN, Vancomycin, etc.) 02/07/2016 12:37 PM  Exposed Catheter (cm) 0 cm 02/07/2016 12:37 PM  Site Assessment Clean;Dry;Intact 02/07/2016 12:37 PM  Lumen #1 Status Flushed;Saline locked;Blood return noted 02/07/2016 12:37 PM  Lumen #2 Status Flushed;Saline locked;Blood return noted 02/07/2016 12:37 PM  Dressing Type Transparent 02/07/2016 12:37 PM  Dressing Change Due 02/14/16 02/07/2016 12:37 PM       Ethelda Chick 02/07/2016, 12:38 PM

## 2016-02-07 NOTE — Progress Notes (Signed)
Pharmacy Antibiotic Note  Summer Howard is a 45 y.o. female admitted on 02/06/2016 with pneumonia.  Pharmacy has been consulted for Vancomycin and Zosyn dosing for patient with CT of chest showing lung abscess/necrotizing pneumonia. She was tx here for a possible thoracotomy. She has been on vanc/zosyn at [facility] but only received one dose at [facility]. A vanc trough drawn today was undetectable but patient only received 2 doses prior to level. Despite an early draw, the trough should have been detectable at this point. Additionally, there seems to be no documentation that a bolus dose of Vancomycin was ever given at [employer]. Will increase Vancomycin dose to ensure proper coverage of pneumonia   Plan: -Give Vancomycin 500 mg IV now. This in addition to 750 mg dose that was just given will add up to a 1250 mg load -Increase maintenance Vancomycin to 1000 mg IV q12h.  Goal trough 15-20 mcg/ml -Continue Zosyn 3.375 gm IV q8h    Height:  (157.5 cm) Weight: 120 lb (54.432 kg) IBW/kg (Calculated) : 50.1  Temp (24hrs), Avg:99.4 F (37.4 C), Min:98.6 F (37 C), Max:100.5 F (38.1 C)   Recent Labs Lab 02/05/16 0930 02/06/16 0702 02/07/16 0300 02/07/16 1054  WBC 7.4 6.5 4.3  --   CREATININE 0.50 0.47 0.62  --   VANCOTROUGH  --   --   --  <4*    Estimated Creatinine Clearance: 72.5 mL/min (by C-G formula based on Cr of 0.62).    Allergies  Allergen Reactions  . Phenobarbital Rash  . Sulfa Antibiotics Rash    Antimicrobials this admission: Zosyn 2/23 >>  Vancomycin 2/23 >>   Microbiology results: 2/23 Sputum Cx pending 2/23 Blood Cx pending x 2  Thank you for allowing pharmacy to be a part of this patient's care.  Vinnie Level, PharmD., BCPS Clinical Pharmacist Pager [phone]

## 2016-02-07 NOTE — Progress Notes (Addendum)
Initial Nutrition Assessment  DOCUMENTATION CODES:   Not applicable  INTERVENTION:   Boost Breeze po TID, each supplement provides 250 kcal and 9 grams of protein   FOLTRIN multivtiamin 3 times daily (folic acid, Vit C, Vit B12, iron)  NUTRITION DIAGNOSIS:   Increased nutrient needs related to acute illness, chronic illness as evidenced by estimated needs  GOAL:   Patient will meet greater than or equal to 90% of their needs  MONITOR:   PO intake, Supplement acceptance, Labs, Weight trends, I & O's  REASON FOR ASSESSMENT:   Consult Assessment of nutrition requirement/status  ASSESSMENT:   45 yo Female with PMH of Anxiety; Depression; Thrombocytopenia (HCC); Gastric ulcer; and Iron deficiency anemia; admitted with necrotizing pneumonia with pleural effusion needing surgical intervention.  Patient reports her appetite is variable. PO intake 25% per flowsheet records. She is trying to take in as much as she can with her respiratory illness. Doesn't do well with milk products so feels she'll do better with Boost Breeze. Will order supplement and MVI with iron, B12, Vit C and folic acid.  Family is very concerned with her nutritional and psychological status.   They felt she would benefit from a Chaplain visit.   RD placed order.  RN notified as well.  RD unable to complete Nutrition Focused Physical Exam at this time.  Diet Order:  Diet regular Room service appropriate?: Yes; Fluid consistency:: Thin  Skin:  Reviewed, no issues  Last BM:  2/23  Height:   Ht Readings from Last 1 Encounters:  02/06/16  (1.575 m)    Weight:   Wt Readings from Last 1 Encounters:  02/06/16 120 lb (54.432 kg)    Ideal Body Weight:  50 kg  BMI:  Body mass index is 21.94 kg/(m^2).  Estimated Nutritional Needs:   Kcal:  1600-1800  Protein:  70-80 gm  Fluid:  1.6-1.8 L  EDUCATION NEEDS:   No education needs identified at this time  Maureen Chatters, RD, LDN Pager  #: [phone] After-Hours Pager #: [phone]

## 2016-02-07 NOTE — Progress Notes (Signed)
Dear DoctorHongalgi:  This patient has been identified as a candidate for PICC for the following reason (s): IV therapy over 48 hours, drug pH or osmolality (causing phlebitis, infiltration in 24 hours) and poor veins/poor circulatory system (CHF, COPD, emphysema, diabetes, steroid use, IV drug abuse, etc.) If you agree, please write an order for the indicated device. For any questions contact the Vascular Access Team at [phone] if no answer, please leave a message.  Thank you for supporting the early vascular access assessment program.

## 2016-02-08 ENCOUNTER — Encounter (HOSPITAL_COMMUNITY)
Admission: AD | Disposition: A | Discharge status: Home / Self Care | Payer: Self-pay | Source: Other Acute Inpatient Hospital | Attending: Internal Medicine

## 2016-02-08 ENCOUNTER — Inpatient Hospital Stay (HOSPITAL_COMMUNITY): Payer: Medicaid Other | Admitting: Certified Registered Nurse Anesthetist

## 2016-02-08 ENCOUNTER — Inpatient Hospital Stay (HOSPITAL_COMMUNITY): Payer: Medicaid Other

## 2016-02-08 DIAGNOSIS — E876 Hypokalemia: Secondary | ICD-10-CM

## 2016-02-08 HISTORY — PX: VIDEO BRONCHOSCOPY: SHX5072

## 2016-02-08 LAB — BASIC METABOLIC PANEL
ANION GAP: 7 (ref 5–15)
BUN: <5 mg/dL — ABNORMAL LOW (ref 6–20)
CO2: 23 mmol/L (ref 22–32)
Calcium: 8.1 mg/dL — ABNORMAL LOW (ref 8.9–10.3)
Chloride: 108 mmol/L (ref 101–111)
Creatinine, Ser: 0.61 mg/dL (ref 0.44–1.00)
Glucose, Bld: 86 mg/dL (ref 65–99)
POTASSIUM: 3.1 mmol/L — AB (ref 3.5–5.1)
SODIUM: 138 mmol/L (ref 135–145)

## 2016-02-08 LAB — CBC
HEMATOCRIT: 26.3 % — AB (ref 36.0–46.0)
HEMOGLOBIN: 8.1 g/dL — AB (ref 12.0–15.0)
MCH: 25.8 pg — ABNORMAL LOW (ref 26.0–34.0)
MCHC: 30.8 g/dL (ref 30.0–36.0)
MCV: 83.8 fL (ref 78.0–100.0)
Platelets: 598 10*3/uL — ABNORMAL HIGH (ref 150–400)
RBC: 3.14 MIL/uL — ABNORMAL LOW (ref 3.87–5.11)
RDW: 13.8 % (ref 11.5–15.5)
WBC: 6.1 10*3/uL (ref 4.0–10.5)

## 2016-02-08 LAB — BLOOD GAS, ARTERIAL
Acid-base deficit: 1.2 mmol/L (ref 0.0–2.0)
Bicarbonate: 21.7 mEq/L (ref 20.0–24.0)
DRAWN BY: 437071
FIO2: 0.21
O2 Saturation: 97.3 %
PATIENT TEMPERATURE: 98.8
PH ART: 7.491 — AB (ref 7.350–7.450)
TCO2: 22.6 mmol/L (ref 0–100)
pCO2 arterial: 28.8 mmHg — ABNORMAL LOW (ref 35.0–45.0)
pO2, Arterial: 170 mmHg — ABNORMAL HIGH (ref 80.0–100.0)

## 2016-02-08 LAB — PROTIME-INR
INR: 1.05 (ref 0.00–1.49)
PROTHROMBIN TIME: 13.9 s (ref 11.6–15.2)

## 2016-02-08 LAB — PREALBUMIN: Prealbumin: 14.1 mg/dL — ABNORMAL LOW (ref 18–38)

## 2016-02-08 LAB — APTT: aPTT: 38 seconds — ABNORMAL HIGH (ref 24–37)

## 2016-02-08 LAB — MRSA PCR SCREENING: MRSA BY PCR: NEGATIVE

## 2016-02-08 SURGERY — BRONCHOSCOPY, VIDEO-ASSISTED
Anesthesia: General | Site: Mouth

## 2016-02-08 SURGERY — BRONCHOSCOPY, VIDEO-ASSISTED
Anesthesia: General

## 2016-02-08 MED ORDER — LIDOCAINE HCL (CARDIAC) 20 MG/ML IV SOLN
INTRAVENOUS | Status: DC | PRN
  Administered 2016-02-08: 60 mg via INTRATRACHEAL
  Administered 2016-02-08: 60 mg via INTRAVENOUS

## 2016-02-08 MED ORDER — OXYCODONE HCL 5 MG PO TABS
5.0000 mg | ORAL_TABLET | ORAL | Status: DC | PRN
  Administered 2016-02-09 – 2016-02-18 (×46): 10 mg via ORAL
  Filled 2016-02-08 (×46): qty 2

## 2016-02-08 MED ORDER — ARTIFICIAL TEARS OP OINT
TOPICAL_OINTMENT | OPHTHALMIC | Status: AC
  Filled 2016-02-08: qty 3.5

## 2016-02-08 MED ORDER — LACTATED RINGERS IV SOLN
INTRAVENOUS | Status: DC | PRN
  Administered 2016-02-08: 11:00:00 via INTRAVENOUS

## 2016-02-08 MED ORDER — ACETAMINOPHEN 650 MG RE SUPP: 650.0000 mg | RECTAL | Status: DC | PRN

## 2016-02-08 MED ORDER — ACETAMINOPHEN 325 MG PO TABS
650.0000 mg | ORAL_TABLET | ORAL | Status: DC | PRN
  Administered 2016-02-10 – 2016-02-19 (×4): 650 mg via ORAL
  Filled 2016-02-08 (×4): qty 2

## 2016-02-08 MED ORDER — EPHEDRINE SULFATE 50 MG/ML IJ SOLN
INTRAMUSCULAR | Status: DC | PRN
  Administered 2016-02-08: 10 mg via INTRAVENOUS
  Administered 2016-02-08: 5 mg via INTRAVENOUS

## 2016-02-08 MED ORDER — POTASSIUM CHLORIDE CRYS ER 20 MEQ PO TBCR
40.0000 meq | EXTENDED_RELEASE_TABLET | Freq: Once | ORAL | Status: AC
  Administered 2016-02-08: 40 meq via ORAL
  Filled 2016-02-08: qty 2

## 2016-02-08 MED ORDER — SODIUM CHLORIDE 0.9% FLUSH: 3.0000 mL | INTRAVENOUS | Status: DC | PRN

## 2016-02-08 MED ORDER — FENTANYL CITRATE (PF) 250 MCG/5ML IJ SOLN
INTRAMUSCULAR | Status: AC
  Filled 2016-02-08: qty 5

## 2016-02-08 MED ORDER — DEXAMETHASONE SODIUM PHOSPHATE 4 MG/ML IJ SOLN
INTRAMUSCULAR | Status: DC | PRN
  Administered 2016-02-08: 4 mg via INTRAVENOUS

## 2016-02-08 MED ORDER — PROPOFOL 10 MG/ML IV BOLUS
INTRAVENOUS | Status: DC | PRN
  Administered 2016-02-08: 100 mg via INTRAVENOUS

## 2016-02-08 MED ORDER — LIDOCAINE HCL (CARDIAC) 20 MG/ML IV SOLN
INTRAVENOUS | Status: AC
  Filled 2016-02-08: qty 5

## 2016-02-08 MED ORDER — PROPOFOL 10 MG/ML IV BOLUS
INTRAVENOUS | Status: AC
  Filled 2016-02-08: qty 20

## 2016-02-08 MED ORDER — ONDANSETRON HCL 4 MG/2ML IJ SOLN
INTRAMUSCULAR | Status: AC
  Filled 2016-02-08: qty 2

## 2016-02-08 MED ORDER — 0.9 % SODIUM CHLORIDE (POUR BTL) OPTIME
TOPICAL | Status: DC | PRN
Start: 2016-02-08 — End: 2016-02-08
  Administered 2016-02-08: 1000 mL

## 2016-02-08 MED ORDER — ROCURONIUM BROMIDE 50 MG/5ML IV SOLN
INTRAVENOUS | Status: AC
  Filled 2016-02-08: qty 1

## 2016-02-08 MED ORDER — SUCCINYLCHOLINE CHLORIDE 20 MG/ML IJ SOLN
INTRAMUSCULAR | Status: DC | PRN
  Administered 2016-02-08: 80 mg via INTRAVENOUS

## 2016-02-08 MED ORDER — FENTANYL CITRATE (PF) 100 MCG/2ML IJ SOLN
INTRAMUSCULAR | Status: DC | PRN
Start: 2016-02-08 — End: 2016-02-08
  Administered 2016-02-08: 50 ug via INTRAVENOUS

## 2016-02-08 MED ORDER — MIDAZOLAM HCL 5 MG/5ML IJ SOLN
INTRAMUSCULAR | Status: DC | PRN
  Administered 2016-02-08: 2 mg via INTRAVENOUS

## 2016-02-08 MED ORDER — ONDANSETRON HCL 4 MG/2ML IJ SOLN
INTRAMUSCULAR | Status: DC | PRN
  Administered 2016-02-08: 4 mg via INTRAVENOUS

## 2016-02-08 MED ORDER — PROMETHAZINE HCL 25 MG/ML IJ SOLN: 6.2500 mg | INTRAMUSCULAR | Status: DC | PRN

## 2016-02-08 MED ORDER — GUAIFENESIN ER 600 MG PO TB12
1200.0000 mg | ORAL_TABLET | Freq: Two times a day (BID) | ORAL | Status: DC
  Administered 2016-02-09 – 2016-02-19 (×21): 1200 mg via ORAL
  Filled 2016-02-08 (×23): qty 2

## 2016-02-08 MED ORDER — SODIUM CHLORIDE 0.9 % IV SOLN: 250.0000 mL | INTRAVENOUS | Status: DC | PRN

## 2016-02-08 MED ORDER — MIDAZOLAM HCL 2 MG/2ML IJ SOLN
INTRAMUSCULAR | Status: AC
  Filled 2016-02-08: qty 2

## 2016-02-08 MED ORDER — ENOXAPARIN SODIUM 30 MG/0.3ML ~~LOC~~ SOLN
30.0000 mg | SUBCUTANEOUS | Status: DC
  Administered 2016-02-08: 30 mg via SUBCUTANEOUS
  Filled 2016-02-08: qty 0.3

## 2016-02-08 MED ORDER — SODIUM CHLORIDE 0.9% FLUSH
3.0000 mL | Freq: Two times a day (BID) | INTRAVENOUS | Status: DC
  Administered 2016-02-09 – 2016-02-15 (×3): 3 mL via INTRAVENOUS

## 2016-02-08 MED ORDER — HYDROMORPHONE HCL 1 MG/ML IJ SOLN: 0.2500 mg | INTRAMUSCULAR | Status: DC | PRN

## 2016-02-08 SURGICAL SUPPLY — 11 items
CONT SPEC 4OZ CLIKSEAL STRL BL (MISCELLANEOUS) ×2 IMPLANT
COVER BACK TABLE 60X90IN (DRAPES) ×2 IMPLANT
GOWN STRL REUS W/ TWL LRG LVL3 (GOWN DISPOSABLE) ×2 IMPLANT
GOWN STRL REUS W/TWL LRG LVL3 (GOWN DISPOSABLE) ×2
OIL SILICONE PENTAX (PARTS (SERVICE/REPAIRS)) ×2 IMPLANT
SYR 20CC LL (SYRINGE) ×2 IMPLANT
SYR 20ML ECCENTRIC (SYRINGE) ×2 IMPLANT
TOWEL OR 17X24 6PK STRL BLUE (TOWEL DISPOSABLE) ×2 IMPLANT
TRAP SPECIMEN MUCOUS 40CC (MISCELLANEOUS) ×4 IMPLANT
TUBE CONNECTING 12X1/4 (SUCTIONS) ×2 IMPLANT
VALVE SUCTION BRONCHIO DISP (MISCELLANEOUS) ×2 IMPLANT

## 2016-02-08 NOTE — Anesthesia Preprocedure Evaluation (Addendum)
Anesthesia Evaluation  Patient identified by MRN, date of birth, ID band Patient awake    Airway Mallampati: I  TM Distance: >3 FB Neck ROM: Full    Dental  (+) Teeth Intact   Pulmonary pneumonia,  Consolidated pneumonia c abscess    + decreased breath sounds      Cardiovascular  Rhythm:Regular Rate:Normal     Neuro/Psych    GI/Hepatic PUD,   Endo/Other    Renal/GU      Musculoskeletal   Abdominal   Peds  Hematology  (+) anemia ,   Anesthesia Other Findings   Reproductive/Obstetrics                            Anesthesia Physical Anesthesia Plan  ASA: II  Anesthesia Plan: General   Post-op Pain Management:    Induction: Intravenous  Airway Management Planned: Oral ETT  Additional Equipment:   Intra-op Plan:   Post-operative Plan: Extubation in OR  Informed Consent: I have reviewed the patients History and Physical, chart, labs and discussed the procedure including the risks, benefits and alternatives for the proposed anesthesia with the patient or authorized representative who has indicated his/her understanding and acceptance.   Dental advisory given  Plan Discussed with: CRNA and Surgeon  Anesthesia Plan Comments:         Anesthesia Quick Evaluation

## 2016-02-08 NOTE — Transfer of Care (Signed)
Immediate Anesthesia Transfer of Care Note  Patient: Summer Howard  Procedure(s) Performed: Procedure(s): VIDEO BRONCHOSCOPY WITH ENDOBRONCHIAL LAVAGE (N/A)  Patient Location: PACU  Anesthesia Type:General  Level of Consciousness: awake, alert  and oriented  Airway & Oxygen Therapy: Patient Spontanous Breathing and Patient connected to nasal cannula oxygen  Post-op Assessment: Report given to RN and Post -op Vital signs reviewed and stable  Post vital signs: Reviewed and stable  Last Vitals:  Filed Vitals:   02/07/16 2140 02/08/16 0603  BP: 128/62 114/68  Pulse: 88 70  Temp: 37.1 C 36.6 C  Resp: 16 16    Complications: No apparent anesthesia complications

## 2016-02-08 NOTE — Op Note (Addendum)
CARDIOTHORACIC SURGERY OPERATIVE NOTE  Date of Procedure:   02/08/2016  Preoperative Diagnosis:  Community-acquired pneumonia with lung abscess  Postoperative Diagnosis:  same  Procedure:     Video Bronchoscopy with Endobronchial Lavage and Brushings   Surgeon:    Salvatore Decent. Cornelius Moras, MD  Assistant:    n/a  Anesthesia:    Sharee Holster, MD  Operative Findings:   No endobronchial obstruction  Erythema of orifice of segmental bronchus to lingular segments of left upper lobe  Purulent endobronchial secretions from lingular segments of left upper lobe after passage of brush     BRIEF CLINICAL NOTE AND INDICATIONS FOR SURGERY  Patient is a 45 year old female with history of previous gastric bypass surgery, colon resection for sigmoid volvulus, chronic gastrojejunal ulcer, chronic iron deficient anemia, and malnutrition who has been referred by Dr. Thelma Barge and transferred to [facility] for management of lung abscess. The patient states that she was in her usual state of health until early this month when she developed symptoms suggestive of upper respiratory tract infection. Within a few days she developed sudden onset fever, productive cough, and pleuritic left-sided chest pain. She initially went to urgent care and was given a prescription for Avelox. Symptoms did not improve and she was seen by her primary care physician who subsequently admitted her to [facility] from 01/20/2016 through 01/23/2016 where she was treated for community acquired pneumonia. CXR performed 01/21/2016 revealed findings consistent with pneumonia involving the lingular segment of the left upper lobe and left lower lobe. She initially was treated with intravenous Rocephin and azithromycin and was subsequently changed to oral Augmentin. Sputum cultures were felt to be consistent with "normal respiratory flora." During her admission she had borderline elevated troponin levels which led to a cardiology  consult. An echocardiogram was performed and notable for normal left ventricular systolic function and no other significant abnormalities. The patient was felt to be at low risk for ischemic heart disease. Following hospital discharge the patient states that she never really got any better. She continued to experience intermittent cough that was occasionally productive of bloody sputum. A time she had small amounts of bright red blood. Her pleuritic chest pain improved on antibiotic therapy, but when her prescription for Augmentin completed her symptoms immediately began to get worse. She experienced intermittent low-grade fevers. She return to her primary care physician who obtained a chest CT scan with intravenous contrast on 02/03/2016. This revealed findings consistent with necrotizing pneumonia with cavitation involving the left upper lobe, primarily in the lingular segment. The patient was readmitted to [facility] and thoracic surgical consultation was requested. The patient was evaluated by Dr. Thelma Barge. An attempt at ultrasound-guided thoracentesis of left pleural effusion was performed but no significant fluid was obtained. The patient was subsequently transferred to [facility] for further management.  The patient has been seen in consultation and counseled at length regarding the indications, risks and potential benefits of surgery.  All questions have been answered, and the patient provides full informed consent for the operation as described.      DETAILS OF THE OPERATIVE PROCEDURE  The patient is brought to the operating room on the above mentioned date and placed in the supine position on the operating table. Pneumatic sequential compression boots are placed on both lower extremities. General endotracheal anesthesia is induced uneventfully. The patient is intubated using a single lumen endotracheal tube.  Flexible fiberoptic bronchoscopy is performed through the patient's  existing endotracheal tube. The distal trachea,  carina, left and right endobronchial trees are visualized.  There is normal endobronchial anatomy.  There is no sign of any endobronchial mass or foreign body in any of the major segmental bronchi. There is mild erythema involving the orifice of the segmental bronchi to the lingular segments of the left upper lobe. The endobronchial tree is lavaged with saline solution and portions of this saline aspirated are trapped to be sent for endobronchial washings. Initially endobronchial washings appear fairly clear. Subsequently, a brush was passed into the lingular segments of the left upper lobe. Following brushings there is a large amount of purulent endobronchial secretions. These are lavaged clear with saline. The endobronchial washings are sent for routine culture, fungal culture, and AFB smear and culture.  An additional specimen of endobronchial washings and the brush are both sent for cytology.  The airways are again inspected and there is no sign of any bleeding.  The bronchoscope was removed uneventfully.  The patient tolerated the procedure well, was extubated in the operative room, and transported to the postanesthesia care in stable condition. All sponge instrument and needle counts are verified correct. There were no intraoperative complications. Estimated blood loss was trivial.    Salvatore Decent. Cornelius Moras MD 02/08/2016 11:53 AM

## 2016-02-08 NOTE — Progress Notes (Signed)
PROGRESS NOTE    Summer Howard  WUJ:[id number]  DOB: 04-Feb-1971  DOA: 02/06/2016 PCP: Summer Bihari, MD Outpatient Specialists:   Hospital course: 45 year old female patient with history of anxiety, depression, iron deficiency anemia, thrombocytopenia, transferred from [facility] for evaluation of lung abscess by cardiothoracic surgery at [facility].  Assessment & Plan:   Community-acquired pneumonia complicated by lung abscess - Cardiothoracic surgery input appreciated. Recommend flexible fiberoptic bronchoscopy first to rule out airway obstruction. - Continue broad-spectrum IV antibiotics and pulmonary toilet. Blood cultures 3 from 2/23: Negative to date - Nutrition consultation. - Pain management for pleuritic chest pain. Chest pain controlled. - Going for FOB this morning   Hemoptysis - Mild and likely secondary to lung abscess. Holding Lovenox and aspirin. Monitor closely.None reported.   Iron deficiency anemia - Aspirin currently held. Monitor CBCs periodically and transfer if hemoglobin <7 g per DL.  Thrombocytosis  - ? Reactive. Improving.   History of gastric ulcer - PPI.  Hypokalemia  - Replace and follow.  DVT prophylaxis: SCD's Code Status: Full Family Communication: Discussed with patient's father and son at bedside on 2/26 .  Disposition Plan: DC home when medically stable   Consultants:  Cardiothoracic surgery   Procedures:  RUE PICC line 2/25.   Antimicrobials:  IV Zosyn    IV vancomycin  Subjective: Seen prior to going down for FOB. Slightly anxious but denies complaints. No chest pain or hemoptysis reported.  Objective: Filed Vitals:   02/07/16 1419 02/07/16 1726 02/07/16 2140 02/08/16 0603  BP: 119/84  128/62 114/68  Pulse: 92  88 70  Temp: 99.2 F (37.3 C) 99.2 F (37.3 C) 98.8 F (37.1 C) 97.8 F (36.6 C)  TempSrc: Oral Axillary Oral Oral  Resp: Height:      Weight:      SpO2: 98%  99% 100%    Intake/Output  Summary (Last 24 hours) at 02/08/16 1011 Last data filed at 02/08/16 0603  Gross per 24 hour  Intake    780 ml  Output    250 ml  Net    530 ml   Filed Weights   02/06/16 1734  Weight: 54.432 kg (120 lb)    Exam:  General exam: Small built and frail young female sitting up comfortably in bed Respiratory system:  Reduced breath sounds left lung fields otherwise clear to auscultation. No increased work of breathing. Cardiovascular system: S1 & S2 heard, RRR. No JVD, murmurs, gallops, clicks or pedal edema. Telemetry: Sinus rhythm.  Gastrointestinal system: Abdomen is nondistended, soft and nontender. Normal bowel sounds heard. Central nervous system: Alert and oriented. No focal neurological deficits. Extremities: Symmetric 5 x 5 power.   Data Reviewed: Basic Metabolic Panel:  Recent Labs Lab 02/05/16 0930 02/06/16 0702 02/07/16 0300 02/08/16 0607  NA 140 137 139 138  K 2.9* 3.8 3.9 3.1*  CL 105 103 107 108  CO2 GLUCOSE 54* 81 85 86  BUN <5* <5* <5* <5*  CREATININE 0.50 0.47 0.62 0.61  CALCIUM 8.5* 8.7* 8.3* 8.1*  MG  --   --  1.8  --   PHOS  --   --  3.6  --    Liver Function Tests:  Recent Labs Lab 02/07/16 0300  AST 16  ALT 11*  ALKPHOS 96  BILITOT 0.5  PROT 5.8*  ALBUMIN 1.9*   No results for input(s): LIPASE, AMYLASE in the last 168 hours. No results for  input(s): AMMONIA in the last 168 hours. CBC:  Recent Labs Lab 02/05/16 0930 02/06/16 0702 02/07/16 0300 02/08/16 0607  WBC 7.4 6.5 4.3 6.1  NEUTROABS  --   --  2.9  --   HGB 9.4* 9.9* 8.4* 8.1*  HCT 28.3* 30.4* 27.2* 26.3*  MCV 81.7 81.4 85.0 83.8  PLT 817* 804* 552* 598*   Cardiac Enzymes: No results for input(s): CKTOTAL, CKMB, CKMBINDEX, TROPONINI in the last 168 hours. BNP (last 3 results) No results for input(s): PROBNP in the last 8760 hours. CBG: No results for input(s): GLUCAP in the last 168 hours.  Recent Results (from the past 240 hour(s))  Blood culture  (single)     Status: None (Preliminary result)   Collection Time: 02/05/16  9:30 AM  Result Value Ref Range Status   Specimen Description BLOOD RIGHT ASSIST CONTROL  Final   Special Requests BOTTLES DRAWN AEROBIC AND ANAEROBIC 1CCAERO,1CCANA  Final   Culture NO GROWTH 3 DAYS  Final   Report Status PENDING  Incomplete  Culture, blood (routine x 2)     Status: None (Preliminary result)   Collection Time: 02/05/16 10:27 AM  Result Value Ref Range Status   Specimen Description BLOOD RIGHT HAND  Final   Special Requests BOTTLES DRAWN AEROBIC AND ANAEROBIC 0.5ML  Final   Culture NO GROWTH 3 DAYS  Final   Report Status PENDING  Incomplete  Culture, blood (routine x 2)     Status: None (Preliminary result)   Collection Time: 02/05/16 10:30 AM  Result Value Ref Range Status   Specimen Description BLOOD LEFT AC  Final   Special Requests BOTTLES DRAWN AEROBIC AND ANAEROBIC 0.5ML  Final   Culture NO GROWTH 3 DAYS  Final   Report Status PENDING  Incomplete  C difficile quick scan w PCR reflex     Status: None   Collection Time: 02/06/16  6:00 AM  Result Value Ref Range Status   C Diff antigen NEGATIVE NEGATIVE Final   C Diff toxin NEGATIVE NEGATIVE Final   C Diff interpretation Negative for C. difficile  Final         Studies: Dg Chest 2 View  02/08/2016  CLINICAL DATA:  Atelectasis, lung abscess, pneumonia EXAM: CHEST  2 VIEW COMPARISON:  02/03/2016 CT FINDINGS: Left pleural effusion with left basilar airspace opacity not significantly changed. Right lung is clear. Right PICC line is in place with the tip in the SVC. Heart is normal size. IMPRESSION: Continued left pleural effusion and left basilar airspace disease, not significantly changed. Electronically Signed   By: Charlett Nose M.D.   On: 02/08/2016 07:49        Scheduled Meds: . acidophilus  2 capsule Oral Daily  . feeding supplement  1 Container Oral TID BM  . ferrous fumarate-b12-vitamic C-folic acid  1 capsule Oral TID PC    . pantoprazole  40 mg Oral Daily  . piperacillin-tazobactam (ZOSYN)  IV  3.375 g Intravenous Q8H  . saccharomyces boulardii  250 mg Oral BID  . sodium chloride flush  3 mL Intravenous Q12H  . sucralfate  1 g Oral TID WC  . vancomycin  1,000 mg Intravenous Q12H   Continuous Infusions:   Principal Problem:   Lung abscess (HCC) Active Problems:   Absolute anemia   Chronic gastrojejunal ulcer   Malnutrition following gastrointestinal surgery   Primary thrombocytopenia (HCC)   Pneumonia   Pleuritic chest pain   Chest pain with low risk for cardiac etiology  Necrotizing pneumonia (HCC)   Pleural effusion   Iron deficiency anemia   Hemoptysis   CAP (community acquired pneumonia)   Protein-calorie malnutrition (HCC)    Time spent: 20 minutes.    Marcellus Scott, MD, FACP, FHM. [facility] Pager [phone]  If 7PM-7AM, please contact night-coverage [email or web address] Password [email or web address] 02/08/2016, 10:11 AM    LOS: 2 days

## 2016-02-08 NOTE — Progress Notes (Signed)
      [address]       [place] [address]             [phone]     CARDIOTHORACIC SURGERY PROGRESS NOTE  Subjective: No significant change.  Cough and pleuritic chest pain persist.  No fevers.  No SOB  Objective: Vital signs in last 24 hours: Temp:  [97.8 F (36.6 C)-99.2 F (37.3 C)] 97.8 F (36.6 C) (02/26 0603) Pulse Rate:  [70-92] 70 (02/26 0603) Cardiac Rhythm:  [-] Normal sinus rhythm (02/25 2035) Resp:  [16] 16 (02/26 0603) BP: (114-128)/(62-84) 114/68 mmHg (02/26 0603) SpO2:  [98 %-100 %] 100 % (02/26 0603)  Physical Exam:  Rhythm:   sinus  Breath sounds: Slightly diminished breath sounds on left, few ronchi  Heart sounds:  RRR  Incisions:  n/a  Abdomen:  soft  Extremities:  warm   Intake/Output from previous day: 02/25 0701 - 02/26 0700 In: 1020 [P.O.:1020] Out: 250 [Urine:250] Intake/Output this shift:    Lab Results:  Recent Labs  02/07/16 0300 02/08/16 0607  WBC 4.3 6.1  HGB 8.4* 8.1*  HCT 27.2* 26.3*  PLT 552* 598*   BMET:  Recent Labs  02/07/16 0300 02/08/16 0607  NA 139 138  K 3.9 3.1*  CL 107 108  CO2 23 23  GLUCOSE 85 86  BUN <5* <5*  CREATININE 0.62 0.61  CALCIUM 8.3* 8.1*    CBG (last 3)  No results for input(s): GLUCAP in the last 72 hours. PT/INR:   Recent Labs  02/08/16 0607  LABPROT 13.9  INR 1.05    CXR:  CHEST 2 VIEW  COMPARISON: 02/03/2016 CT  FINDINGS: Left pleural effusion with left basilar airspace opacity not significantly changed. Right lung is clear. Right PICC line is in place with the tip in the SVC. Heart is normal size.  IMPRESSION: Continued left pleural effusion and left basilar airspace disease, not significantly changed.   Electronically Signed  By: Charlett Nose M.D.  On: 02/08/2016 07:49  Assessment/Plan:  Patient remains clinically stable.  I think the CXR looks somewhat improved although the abscess cavity can still be clearly seen, particularly on  the lateral film.  Will proceed with video bronchoscopy for endobronchial examination and lavage to r/o any type of airway obstruction and obtain washings for culture.    I have discussed the indications, risks and potential benefits of bronchoscopy with the patient.  We have additionally reviewed treatment options for management of her lung abscess (assuming that there are no signs of airway obstruction) including continued medical therapy, an attempt at CT-guided percutaneous drainage, VATS for drainage, and more radical surgery (thoracotomy) for drainage and possible pulmonary resection.  All of her questions have been addressed.   I spent in excess of 30 minutes during the conduct of this hospital encounter and >50% of this time involved direct face-to-face encounter with the patient for counseling and/or coordination of their care.    Purcell Nails, MD 02/08/2016 8:51 AM

## 2016-02-08 NOTE — Anesthesia Procedure Notes (Signed)
Procedure Name: Intubation Date/Time: 02/08/2016 11:17 AM Performed by: [place] Pink Pre-anesthesia Checklist: Patient identified, Emergency Drugs available, Suction available, Patient being monitored and Timeout performed Patient Re-evaluated:Patient Re-evaluated prior to inductionOxygen Delivery Method: Circle system utilized Preoxygenation: Pre-oxygenation with 100% oxygen Intubation Type: IV induction Ventilation: Mask ventilation without difficulty Laryngoscope Size: Mac and 3 Grade View: Grade I Tube type: Oral Tube size: 8.5 mm Number of attempts: 1 Airway Equipment and Method: Stylet and LTA kit utilized Placement Confirmation: ETT inserted through vocal cords under direct vision,  positive ETCO2 and breath sounds checked- equal and bilateral Secured at: 21 cm Tube secured with: Tape Dental Injury: Teeth and Oropharynx as per pre-operative assessment

## 2016-02-08 NOTE — Anesthesia Postprocedure Evaluation (Signed)
Anesthesia Post Note  Patient: Summer Howard  Procedure(s) Performed: Procedure(s) (LRB): VIDEO BRONCHOSCOPY WITH ENDOBRONCHIAL LAVAGE (N/A)  Patient location during evaluation: PACU Anesthesia Type: General Level of consciousness: awake and alert Pain management: pain level controlled Vital Signs Assessment: post-procedure vital signs reviewed and stable Respiratory status: spontaneous breathing, nonlabored ventilation, respiratory function stable and patient connected to nasal cannula oxygen Cardiovascular status: blood pressure returned to baseline and stable Postop Assessment: no signs of nausea or vomiting Anesthetic complications: no    Last Vitals:  Filed Vitals:   02/08/16 1242 02/08/16 1255  BP: 119/71   Pulse: 94   Temp:  37.4 C  Resp: 10     Last Pain:  Filed Vitals:   02/08/16 1257  PainSc: 4                  Meer Reindl,JAMES TERRILL

## 2016-02-08 NOTE — Anesthesia Postprocedure Evaluation (Signed)
Anesthesia Post Note  Patient: RAYMONDA PELL  Procedure(s) Performed: Procedure(s) (LRB): VIDEO BRONCHOSCOPY WITH ENDOBRONCHIAL LAVAGE (N/A)  Patient location during evaluation: PACU Anesthesia Type: General Level of consciousness: awake and alert Pain management: pain level controlled Vital Signs Assessment: post-procedure vital signs reviewed and stable Respiratory status: spontaneous breathing, nonlabored ventilation, respiratory function stable and patient connected to nasal cannula oxygen Cardiovascular status: blood pressure returned to baseline and stable Postop Assessment: no signs of nausea or vomiting Anesthetic complications: no    Last Vitals:  Filed Vitals:   02/08/16 1255 02/08/16 1257  BP:  110/77  Pulse:  95  Temp: 37.4 C   Resp:  8    Last Pain:  Filed Vitals:   02/08/16 1258  PainSc: 4                  Arturo Sofranko,JAMES TERRILL

## 2016-02-09 ENCOUNTER — Inpatient Hospital Stay (HOSPITAL_COMMUNITY): Payer: Medicaid Other

## 2016-02-09 ENCOUNTER — Encounter (HOSPITAL_COMMUNITY): Payer: Self-pay | Admitting: Thoracic Surgery (Cardiothoracic Vascular Surgery)

## 2016-02-09 LAB — LEGIONELLA ANTIGEN, URINE

## 2016-02-09 LAB — BASIC METABOLIC PANEL
Anion gap: 11 (ref 5–15)
BUN: <5 mg/dL — ABNORMAL LOW (ref 6–20)
CHLORIDE: 108 mmol/L (ref 101–111)
CO2: 23 mmol/L (ref 22–32)
CREATININE: 0.53 mg/dL (ref 0.44–1.00)
Calcium: 8.4 mg/dL — ABNORMAL LOW (ref 8.9–10.3)
GFR calc non Af Amer: >60 mL/min (ref >60)
Glucose, Bld: 82 mg/dL (ref 65–99)
POTASSIUM: 3.2 mmol/L — AB (ref 3.5–5.1)
SODIUM: 142 mmol/L (ref 135–145)

## 2016-02-09 LAB — ACID FAST SMEAR (AFB)

## 2016-02-09 LAB — CBC
HEMATOCRIT: 24.8 % — AB (ref 36.0–46.0)
HEMOGLOBIN: 7.6 g/dL — AB (ref 12.0–15.0)
MCH: 25.5 pg — ABNORMAL LOW (ref 26.0–34.0)
MCHC: 30.6 g/dL (ref 30.0–36.0)
MCV: 83.2 fL (ref 78.0–100.0)
Platelets: 587 10*3/uL — ABNORMAL HIGH (ref 150–400)
RBC: 2.98 MIL/uL — AB (ref 3.87–5.11)
RDW: 13.9 % (ref 11.5–15.5)
WBC: 7.3 10*3/uL (ref 4.0–10.5)

## 2016-02-09 LAB — HEMOGLOBIN A1C
Hgb A1c MFr Bld: 5.9 % — ABNORMAL HIGH (ref 4.8–5.6)
MEAN PLASMA GLUCOSE: 123 mg/dL

## 2016-02-09 LAB — ACID FAST SMEAR (AFB, MYCOBACTERIA): Acid Fast Smear: NEGATIVE

## 2016-02-09 MED ORDER — ENOXAPARIN SODIUM 30 MG/0.3ML ~~LOC~~ SOLN
30.0000 mg | SUBCUTANEOUS | Status: DC
  Administered 2016-02-10 – 2016-02-15 (×6): 30 mg via SUBCUTANEOUS
  Filled 2016-02-09 (×6): qty 0.3

## 2016-02-09 MED ORDER — POTASSIUM CHLORIDE CRYS ER 20 MEQ PO TBCR
30.0000 meq | EXTENDED_RELEASE_TABLET | ORAL | Status: AC
  Administered 2016-02-09 (×2): 30 meq via ORAL
  Filled 2016-02-09 (×2): qty 1

## 2016-02-09 MED ORDER — ENOXAPARIN SODIUM 30 MG/0.3ML ~~LOC~~ SOLN: 30.0000 mg | SUBCUTANEOUS | Status: DC

## 2016-02-09 NOTE — Progress Notes (Signed)
PROGRESS NOTE    Summer Howard  RUE:[id number]  DOB: 08-Dec-1971  DOA: 02/06/2016 PCP: Rafael Bihari, MD Outpatient Specialists:   Hospital course: 45 year old female patient with history of anxiety, depression, iron deficiency anemia, thrombocytopenia, transferred from [facility] for evaluation of lung abscess by cardiothoracic surgery at [facility].  Assessment & Plan:   Community-acquired pneumonia complicated by lung abscess - Cardiothoracic surgery follow-up appreciated.  - Status post video bronchoscopy with endobronchial lavage 2/26: Cultures pending. - TCTS requested IR for CT-guided percutaneous drainage of abscess. - Continue broad-spectrum IV antibiotics and pulmonary toilet. Blood cultures 3 from 2/23: Negative to date - Nutrition consultation appreciated. - Pain management for pleuritic chest pain. Chest pain controlled.  Hemoptysis - Mild and likely secondary to lung abscess. Holding Lovenox and aspirin. Monitor closely. None reported.   Iron deficiency anemia - Aspirin currently held. Monitor CBCs periodically and transfer if hemoglobin <7 g per DL.  Thrombocytosis  - ? Reactive. Improving.   History of gastric ulcer - PPI.  Hypokalemia  - Replace and follow.  DVT prophylaxis: SCD's Code Status: Full Family Communication: Discussed with patient's father at bedside on 2/27 .  Disposition Plan: DC home when medically stable   Consultants:  Cardiothoracic surgery   Procedures:  RUE PICC line 2/25. Site looks clean and dry.  Video bronchoscopy with endobronchial lavage 2/26  Antimicrobials:  IV Zosyn 2/23 >   IV vancomycin 2/23 >  Subjective: Intermittent nonproductive cough. No chest pain reported.  Objective: Filed Vitals:   02/08/16 1342 02/08/16 1758 02/08/16 2217 02/09/16 0636  BP: 104/64 104/55 115/72 103/62  Pulse: 91 84 75 73  Temp: 98 F (36.7 C) 98.5 F (36.9 C) 98.3 F (36.8 C) 98.1 F (36.7 C)  TempSrc: Oral Oral Oral Oral    Resp: Height:      Weight:      SpO2: 96% 97% 100% 98%    Intake/Output Summary (Last 24 hours) at 02/09/16 1526 Last data filed at 02/09/16 1443  Gross per 24 hour  Intake    960 ml  Output    850 ml  Net    110 ml   Filed Weights   02/06/16 1734  Weight: 54.432 kg (120 lb)    Exam:  General exam: Small built and frail young female sitting up comfortably in bed Respiratory system:  Reduced breath sounds left lung fields otherwise clear to auscultation. No increased work of breathing. Cardiovascular system: S1 & S2 heard, RRR. No JVD, murmurs, gallops, clicks or pedal edema. Telemetry: Sinus rhythm.  Gastrointestinal system: Abdomen is nondistended, soft and nontender. Normal bowel sounds heard. Central nervous system: Alert and oriented. No focal neurological deficits. Extremities: Symmetric 5 x 5 power.   Data Reviewed: Basic Metabolic Panel:  Recent Labs Lab 02/05/16 0930 02/06/16 0702 02/07/16 0300 02/08/16 0607 02/09/16 0553  NA 140 137 139 138 142  K 2.9* 3.8 3.9 3.1* 3.2*  CL 105 103 107 108 108  CO2 GLUCOSE 54* 81 85 86 82  BUN <5* <5* <5* <5* <5*  CREATININE 0.50 0.47 0.62 0.61 0.53  CALCIUM 8.5* 8.7* 8.3* 8.1* 8.4*  MG  --   --  1.8  --   --   PHOS  --   --  3.6  --   --    Liver Function Tests:  Recent Labs Lab 02/07/16 0300  AST 16  ALT 11*  ALKPHOS 96  BILITOT 0.5  PROT 5.8*  ALBUMIN 1.9*   No results for input(s): LIPASE, AMYLASE in the last 168 hours. No results for input(s): AMMONIA in the last 168 hours. CBC:  Recent Labs Lab 02/05/16 0930 02/06/16 0702 02/07/16 0300 02/08/16 0607 02/09/16 0553  WBC 7.4 6.5 4.3 6.1 7.3  NEUTROABS  --   --  2.9  --   --   HGB 9.4* 9.9* 8.4* 8.1* 7.6*  HCT 28.3* 30.4* 27.2* 26.3* 24.8*  MCV 81.7 81.4 85.0 83.8 83.2  PLT 817* 804* 552* 598* 587*   Cardiac Enzymes: No results for input(s): CKTOTAL, CKMB, CKMBINDEX, TROPONINI in the last 168 hours. BNP (last 3  results) No results for input(s): PROBNP in the last 8760 hours. CBG: No results for input(s): GLUCAP in the last 168 hours.  Recent Results (from the past 240 hour(s))  Blood culture (single)     Status: None (Preliminary result)   Collection Time: 02/05/16  9:30 AM  Result Value Ref Range Status   Specimen Description BLOOD RIGHT ASSIST CONTROL  Final   Special Requests BOTTLES DRAWN AEROBIC AND ANAEROBIC 1CCAERO,1CCANA  Final   Culture NO GROWTH 3 DAYS  Final   Report Status PENDING  Incomplete  Culture, blood (routine x 2)     Status: None (Preliminary result)   Collection Time: 02/05/16 10:27 AM  Result Value Ref Range Status   Specimen Description BLOOD RIGHT HAND  Final   Special Requests BOTTLES DRAWN AEROBIC AND ANAEROBIC 0.5ML  Final   Culture NO GROWTH 3 DAYS  Final   Report Status PENDING  Incomplete  Culture, blood (routine x 2)     Status: None (Preliminary result)   Collection Time: 02/05/16 10:30 AM  Result Value Ref Range Status   Specimen Description BLOOD LEFT AC  Final   Special Requests BOTTLES DRAWN AEROBIC AND ANAEROBIC 0.5ML  Final   Culture NO GROWTH 3 DAYS  Final   Report Status PENDING  Incomplete  C difficile quick scan w PCR reflex     Status: None   Collection Time: 02/06/16  6:00 AM  Result Value Ref Range Status   C Diff antigen NEGATIVE NEGATIVE Final   C Diff toxin NEGATIVE NEGATIVE Final   C Diff interpretation Negative for C. difficile  Final  MRSA PCR Screening     Status: None   Collection Time: 02/08/16  9:14 AM  Result Value Ref Range Status   MRSA by PCR NEGATIVE NEGATIVE Final    Comment:        The GeneXpert MRSA Assay (FDA approved for NASAL specimens only), is one component of a comprehensive MRSA colonization surveillance program. It is not intended to diagnose MRSA infection nor to guide or monitor treatment for MRSA infections.   Culture, respiratory (NON-Expectorated)     Status: None (Preliminary result)   Collection  Time: 02/08/16 11:40 AM  Result Value Ref Range Status   Specimen Description BRONCHIAL WASHINGS LEFT UPPER LUNG  Final   Special Requests   Final    BRONCHIAL WASHING LINGULAR SEGMENT LEFT UPPER LOBE POF ZOSYN   Gram Stain   Final    MODERATE WBC PRESENT,BOTH PMN AND MONONUCLEAR RARE SQUAMOUS EPITHELIAL CELLS PRESENT NO ORGANISMS SEEN Performed at [facility]    Culture NO GROWTH Performed at [facility]   Final   Report Status PENDING  Incomplete         Studies: Dg Chest 2 View  02/09/2016  CLINICAL DATA:  Lung abscess. EXAM: CHEST  2 VIEW COMPARISON:  Chest radiograph February 08, 2016; chest CT February 03, 2016 FINDINGS: Central catheter tip is in the superior vena cava. No pneumothorax is apparent on this study. The extensive consolidation in the left lower lobe persists without appreciable change. The lucency consistent with abscess is new appreciable by radiography but better seen by CT. This area of irregular air collection in the left lower lobe measures 3.5 x 2.8 x 2.4 cm by radiography. There is a small left pleural effusion as well. Elsewhere lungs are clear. The heart size and pulmonary vascularity appear normal. No adenopathy is demonstrable by radiography. IMPRESSION: Persistent left lower lobe abscess with consolidation and irregular air collection, better seen on CT. Small left pleural effusion. No new opacity. No pneumothorax. Central catheter tip in superior vena cava. Cardiac silhouette within normal limits. Electronically Signed   By: Bretta Bang III M.D.   On: 02/09/2016 07:23   Dg Chest 2 View  02/08/2016  CLINICAL DATA:  Atelectasis, lung abscess, pneumonia EXAM: CHEST  2 VIEW COMPARISON:  02/03/2016 CT FINDINGS: Left pleural effusion with left basilar airspace opacity not significantly changed. Right lung is clear. Right PICC line is in place with the tip in the SVC. Heart is normal size. IMPRESSION: Continued left pleural effusion and left  basilar airspace disease, not significantly changed. Electronically Signed   By: Charlett Nose M.D.   On: 02/08/2016 07:49   Dg Chest Port 1 View  02/08/2016  CLINICAL DATA:  Lung abscess post bronchoscopy EXAM: PORTABLE CHEST 1 VIEW COMPARISON:  02/07/2015 FINDINGS: No pneumothorax following bronchoscopy. Continued left lower lobe opacity and left effusion. Right lung is clear. Right PICC line is unchanged. Heart is normal size. IMPRESSION: Continued left lower lobe opacity and left effusion. No pneumothorax following bronchoscopy. Electronically Signed   By: Charlett Nose M.D.   On: 02/08/2016 12:19        Scheduled Meds: . acidophilus  2 capsule Oral Daily  . [START ON 02/10/2016] enoxaparin (LOVENOX) injection  30 mg Subcutaneous Q24H  . feeding supplement  1 Container Oral TID BM  . ferrous fumarate-b12-vitamic C-folic acid  1 capsule Oral TID PC  . guaiFENesin  1,200 mg Oral BID  . pantoprazole  40 mg Oral Daily  . piperacillin-tazobactam (ZOSYN)  IV  3.375 g Intravenous Q8H  . saccharomyces boulardii  250 mg Oral BID  . sodium chloride flush  3 mL Intravenous Q12H  . sodium chloride flush  3 mL Intravenous Q12H  . sucralfate  1 g Oral TID WC  . vancomycin  1,000 mg Intravenous Q12H   Continuous Infusions:   Principal Problem:   Lung abscess (HCC) Active Problems:   Absolute anemia   Chronic gastrojejunal ulcer   Malnutrition following gastrointestinal surgery   Primary thrombocytopenia (HCC)   Pneumonia   Pleuritic chest pain   Chest pain with low risk for cardiac etiology   Necrotizing pneumonia (HCC)   Pleural effusion   Iron deficiency anemia   Hemoptysis   CAP (community acquired pneumonia)   Protein-calorie malnutrition (HCC)    Time spent: 20 minutes.    Marcellus Scott, MD, FACP, FHM. [facility] Pager [phone]  If 7PM-7AM, please contact night-coverage [email or web address] Password [email or web address] 02/09/2016, 3:26 PM    LOS: 3 days

## 2016-02-09 NOTE — Progress Notes (Signed)
Nutrition Follow-up  DOCUMENTATION CODES:   Not applicable  INTERVENTION:   -Continue Boost Breeze po TID, each supplement provides 250 kcal and 9 grams of protein -Continue FOLTRIN multivtiamin 3 times daily (folic acid, Vit C, Vit B12, iron)  NUTRITION DIAGNOSIS:   Increased nutrient needs related to acute illness, chronic illness as evidenced by estimated needs.  Ongoing  GOAL:   Patient will meet greater than or equal to 90% of their needs  Progressing   MONITOR:   PO intake, Supplement acceptance, Labs, Weight trends, I & O's  REASON FOR ASSESSMENT:   Consult Assessment of nutrition requirement/status  ASSESSMENT:   45 yo Female with PMH of Anxiety; Depression; Thrombocytopenia (HCC); Gastric ulcer; and Iron deficiency anemia; admitted with necrotizing pneumonia with pleural effusion needing surgical intervention  S/P Procedure(s) (LRB) 02/08/16: VIDEO BRONCHOSCOPY WITH ENDOBRONCHIAL LAVAGE (N/A)  Chaplain visit pt on 02/09/16 and will follow for ongoing spiritual support.   Noted IR consult today for upcoming left chest tube placement.   Pt was last evaluated by RD on 02/07/16; see note for further details. Case discussed with RN. Pt with poor meal intake (PO: 10-85%). Pt is taking her Foltrin MVI and Boost Breeze supplements well (prefers wild Personnel officer), per Charity fundraiser. RD will continue supplements.   Labs reviewed: K: 3.0.   Diet Order:  Diet NPO time specified Except for: Sips with Meds Diet regular Room service appropriate?: Yes; Fluid consistency:: Thin  Skin:  Reviewed, no issues  Last BM:  02/08/16  Height:   Ht Readings from Last 1 Encounters:  02/06/16  (1.575 m)    Weight:   Wt Readings from Last 1 Encounters:  02/06/16 120 lb (54.432 kg)    Ideal Body Weight:  50 kg  BMI:  Body mass index is 21.94 kg/(m^2).  Estimated Nutritional Needs:   Kcal:  1600-1800  Protein:  70-80 gm  Fluid:  1.6-1.8 L  EDUCATION NEEDS:   No  education needs identified at this time  Shanti Eichel A. Mayford Knife, RD, LDN, CDE Pager: [phone] After hours Pager: [phone]

## 2016-02-09 NOTE — Consult Note (Signed)
Chief Complaint: Patient was seen in consultation today for Left chest tube drain placement at the request of Dr Tressie Stalker  Referring Physician(s): Dr Guerry Minors  History of Present Illness: Summer Howard is a 45 y.o. female   Pt was recently hospitalized with PNA 2/7-2/10/17 Continued to have left sided cheat pain and fevers Was re admitted 02/04/16 through ED Left lung abscess on CT IMPRESSION: 1. Extensive parenchymal infiltrate throughout the left upper lobe and lingula as well as the left lower lobe with areas of cavitation primarily involving the inferior left upper lobe and lingula. In view of the extensive involvement, necrotizing pneumonia is the primary consideration generally bacterial or fungal in origin. Patchy areas of involvement also are noted in the right upper and right lower lobes.  Bronchoscopy and lavage 2/26 with Dr Cornelius Moras Low grade fever and night sweats He has reviewed imaging and is requesting left lung abscess chest tube drain placement Dr Miles Costain has reviewed imaging and discussed with Dr Cornelius Moras He approves procedure I have seen and examined pt  Last dose Lovenox 1000 pm /26 Last ate : breakfast 2/27  Past Medical History  Diagnosis Date  . Anxiety   . Depression   . Thrombocytopenia (HCC)   . Gastric ulcer   . Iron deficiency anemia   . 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 Malnutrition following gastrointestinal surgery 10/22/2014  . 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 Lung abscess (HCC) 02/03/2016  . CAP (community acquired pneumonia) 01/21/2016  . Protein-calorie malnutrition [facility])     Past Surgical History  Procedure Laterality Date  . Cesarean section    . Cholecystectomy    . Appendectomy    . Gastric bypass  2011  . Colon surgery      resection of sigmoid volvulus    Allergies: Phenobarbital and Sulfa  antibiotics  Medications: Prior to Admission medications   Medication Sig Start Date End Date Taking? Authorizing Provider  acetaminophen (TYLENOL) 500 MG tablet Take 1,000 mg by mouth every 6 (six) hours as needed for mild pain, fever or headache.   Yes Historical Provider, MD  acidophilus (RISAQUAD) CAPS capsule Take 2 capsules by mouth daily. 02/06/16  Yes Enedina Finner, MD  aspirin EC 81 MG tablet Take 1 tablet (81 mg total) by mouth daily. 01/23/16  Yes Shaune Pollack, MD  calcium carbonate (TUMS - DOSED IN MG ELEMENTAL CALCIUM) 500 MG chewable tablet Chew 2 tablets by mouth as needed for indigestion or heartburn.   Yes Historical Provider, MD  Multiple Vitamin (MULTIVITAMIN WITH MINERALS) TABS tablet Take 1 tablet by mouth daily.   Yes Historical Provider, MD  ondansetron (ZOFRAN) 4 MG tablet Take 4 mg by mouth every 8 (eight) hours as needed for nausea or vomiting.    Yes Historical Provider, MD  pantoprazole (PROTONIX) 40 MG tablet Take 40 mg by mouth daily.    Yes Historical Provider, MD  sucralfate (CARAFATE) 1 GM/10ML suspension Take 1 g by mouth 3 (three) times daily with meals.   Yes Historical Provider, MD  piperacillin-tazobactam (ZOSYN) 3.375 GM/50ML IVPB Inject 50 mLs (3.375 g total) into the vein every 8 (eight) hours. 02/06/16   Enedina Finner, MD  Vancomycin (VANCOCIN) 750 MG/150ML SOLN Inject 150 mLs (750 mg total) into the vein every 12 (twelve) hours. 02/06/16   Enedina Finner, MD     Family History  Problem Relation Age of Onset  .  Breast cancer Mother   . Hypertension Father   . Prostate cancer Father   . Hypertension Brother   . Anxiety disorder Brother     Social History   Social History  . Marital Status: Widowed    Spouse Name: N/A  . Number of Children: N/A  . Years of Education: N/A   Social History Main Topics  . Smoking status: Never Smoker   . Smokeless tobacco: Never Used  . Alcohol Use: No  . Drug Use: No  . Sexual Activity: No   Other Topics Concern  . None    Social History Narrative     Review of Systems: A 12 point ROS discussed and pertinent positives are indicated in the HPI above.  All other systems are negative.  Review of Systems  Constitutional: Positive for fever, chills, diaphoresis, appetite change and fatigue. Negative for activity change.  Respiratory: Negative for shortness of breath.   Cardiovascular: Positive for chest pain.  Gastrointestinal: Positive for nausea. Negative for abdominal pain.  Neurological: Positive for weakness.  Psychiatric/Behavioral: Negative for behavioral problems and confusion.    Vital Signs: BP 103/62 mmHg  Pulse 73  Temp(Src) 98.1 F (36.7 C) (Oral)  Resp 16  Ht 5\' 2"  (1.575 m)  Wt 120 lb (54.432 kg)  BMI 21.94 kg/m2  SpO2 98%  Physical Exam  Constitutional: She is oriented to person, place, and time.  Cardiovascular: Normal rate, regular rhythm and normal heart sounds.   Pulmonary/Chest: Effort normal and breath sounds normal.  Abdominal: Soft. Bowel sounds are normal.  Musculoskeletal: Normal range of motion.  Neurological: She is alert and oriented to person, place, and time.  Skin: Skin is warm and dry.  Psychiatric: She has a normal mood and affect. Her behavior is normal. Thought content normal.  Nursing note and vitals reviewed.   Mallampati Score:  MD Evaluation Airway: WNL Heart: WNL Abdomen: WNL Chest/ Lungs: WNL ASA  Classification: 2, 3 Mallampati/Airway Score: One  Imaging: Dg Chest 2 View  02/09/2016  CLINICAL DATA:  Lung abscess. EXAM: CHEST  2 VIEW COMPARISON:  Chest radiograph February 08, 2016; chest CT February 03, 2016 FINDINGS: Central catheter tip is in the superior vena cava. No pneumothorax is apparent on this study. The extensive consolidation in the left lower lobe persists without appreciable change. The lucency consistent with abscess is new appreciable by radiography but better seen by CT. This area of irregular air collection in the left lower  lobe measures 3.5 x 2.8 x 2.4 cm by radiography. There is a small left pleural effusion as well. Elsewhere lungs are clear. The heart size and pulmonary vascularity appear normal. No adenopathy is demonstrable by radiography. IMPRESSION: Persistent left lower lobe abscess with consolidation and irregular air collection, better seen on CT. Small left pleural effusion. No new opacity. No pneumothorax. Central catheter tip in superior vena cava. Cardiac silhouette within normal limits. Electronically Signed   By: Bretta Bang III M.D.   On: 02/09/2016 07:23   Dg Chest 2 View  02/08/2016  CLINICAL DATA:  Atelectasis, lung abscess, pneumonia EXAM: CHEST  2 VIEW COMPARISON:  02/03/2016 CT FINDINGS: Left pleural effusion with left basilar airspace opacity not significantly changed. Right lung is clear. Right PICC line is in place with the tip in the SVC. Heart is normal size. IMPRESSION: Continued left pleural effusion and left basilar airspace disease, not significantly changed. Electronically Signed   By: Charlett Nose M.D.   On: 02/08/2016 07:49   Dg  Chest 2 View  01/21/2016  CLINICAL DATA:  Pneumonia EXAM: CHEST  2 VIEW COMPARISON:  None. FINDINGS: There is airspace consolidation throughout much of the left lower lobe as well as portions of the lingula. Right lung is clear. Heart size and pulmonary vascularity are normal. No adenopathy. There is a small bone island in the right proximal humerus. IMPRESSION: Extensive pneumonia involving portions of the left lower lobe and lingula. Right lung clear. Cardiac silhouette within normal limits. Followup PA and lateral chest radiographs recommended in 3-4 weeks following trial of antibiotic therapy to ensure resolution and exclude underlying malignancy. Electronically Signed   By: Bretta Bang III M.D.   On: 01/21/2016 07:18   Ct Chest W Contrast  02/04/2016  CLINICAL DATA:  Severe left-sided chest pain and congestion, recent diagnosis of pneumonia, completed  antibiotic therapy EXAM: CT CHEST WITH CONTRAST TECHNIQUE: Multidetector CT imaging of the chest was performed during intravenous contrast administration. CONTRAST:  75mL OMNIPAQUE IOHEXOL 300 MG/ML  SOLN COMPARISON:  Chest x-ray of 01/21/2016 FINDINGS: There is airspace disease throughout the inferior lateral aspect of the left upper lobe extending peripherally with areas of cavitation most consistent with necrotizing pneumonia, generally bacterial or viral in origin. Some involvement of the superior segment of the left lower lobe also is noted. Minimal opacity is noted within the anterior inferior right upper lobe on image 26, and in the posterior right lower lobe on image 35. These may represent areas of early infiltrate as well. There is a moderate size left pleural effusion present. The right lung is clear. The central airway is patent. On soft tissue window images, the thyroid gland is unremarkable. On this enhanced study, the thoracic aorta opacifies with no significant abnormality noted. The pulmonary arteries are not as well opacified but no central abnormality is evident. No mediastinal or hilar adenopathy is seen. Surgical sutures are noted from prior gastric bypass surgery. Clips are present from prior cholecystectomy. The thoracic vertebrae are in normal alignment with normal disc spaces. IMPRESSION: 1. Extensive parenchymal infiltrate throughout the left upper lobe and lingula as well as the left lower lobe with areas of cavitation primarily involving the inferior left upper lobe and lingula. In view of the extensive involvement, necrotizing pneumonia is the primary consideration generally bacterial or fungal in origin. Patchy areas of involvement also are noted in the right upper and right lower lobes. 2. Moderate size left pleural effusion. Electronically Signed   By: Dwyane Dee M.D.   On: 02/04/2016 08:17   Dg Chest Port 1 View  02/08/2016  CLINICAL DATA:  Lung abscess post bronchoscopy EXAM:  PORTABLE CHEST 1 VIEW COMPARISON:  02/07/2015 FINDINGS: No pneumothorax following bronchoscopy. Continued left lower lobe opacity and left effusion. Right lung is clear. Right PICC line is unchanged. Heart is normal size. IMPRESSION: Continued left lower lobe opacity and left effusion. No pneumothorax following bronchoscopy. Electronically Signed   By: Charlett Nose M.D.   On: 02/08/2016 12:19   US Thoracentesis Asp Pleural Space W/img Guide  02/05/2016  CLINICAL DATA:  Cavitary left lung pneumonia, parapneumonic effusion EXAM: ULTRASOUND GUIDED LEFT THORACENTESIS COMPARISON:  02/03/2016 PROCEDURE: An ultrasound guided thoracentesis was thoroughly discussed with the patient and questions answered. The benefits, risks, alternatives and complications were also discussed. The patient understands and wishes to proceed with the procedure. Written consent was obtained. Ultrasound was performed to localize and mark an adequate pocket of fluid in the right chest. The area was then prepped and draped in the  normal sterile fashion. 1% Lidocaine was used for local anesthesia. Under ultrasound guidance a 19 gauge Yueh catheter was initially introduced. Fluid could not be aspirated. Pleural effusion appears complex by ultrasound. Needle position confirmed with ultrasound. Next, a second access was performed at the interspace above. This also was done under sterile conditions and local anesthesia. Catheter position confirmed in the pleural space with ultrasound. Syringe aspiration failed to yield any fluid. Next, a safety centesis needle catheter was utilized. This also was inserted under sterile conditions and local anesthesia into the pleural space on the left side. Position confirmed with ultrasound. Syringe aspiration yielded scant blood-tinged pleural fluid. After the third attempt, procedure was stopped. Sample was not sent for laboratory analysis. Complications:  None. FINDINGS: Ultrasound imaging confirms a complex  hypoechoic small left effusion. Ultrasound thoracentesis performed however because of the complexity of the fluid only a scant 1-2 cc sample could be obtained. IMPRESSION: Successful ultrasound guided left thoracentesis yielding only a small amount of blood tinged left pleural fluid. Sample was not sent for laboratory analysis. Findings discussed with Dr. Thelma Barge. Electronically Signed   By: Judie Petit.  Shick M.D.   On: 02/05/2016 16:13    Labs:  CBC:  Recent Labs  02/06/16 0702 02/07/16 0300 02/08/16 0607 02/09/16 0553  WBC 6.5 4.3 6.1 7.3  HGB 9.9* 8.4* 8.1* 7.6*  HCT 30.4* 27.2* 26.3* 24.8*  PLT 804* 552* 598* 587*    COAGS:  Recent Labs  01/20/16 1628 02/08/16 0607  INR 1.02 1.05  APTT 34 38*    BMP:  Recent Labs  02/06/16 0702 02/07/16 0300 02/08/16 0607 02/09/16 0553  NA 137 139 138 142  K 3.8 3.9 3.1* 3.2*  CL 103 107 108 108  CO2 28 23 23 23   GLUCOSE 81 85 86 82  BUN <5* <5* <5* <5*  CALCIUM 8.7* 8.3* 8.1* 8.4*  CREATININE 0.47 0.62 0.61 0.53  GFRNONAA >60 >60 >60 >60  GFRAA >60 >60 >60 >60    LIVER FUNCTION TESTS:  Recent Labs  02/07/16 0300  BILITOT 0.5  AST 16  ALT 11*  ALKPHOS 96  PROT 5.8*  ALBUMIN 1.9*    TUMOR MARKERS: No results for input(s): AFPTM, CEA, CA199, CHROMGRNA in the last 8760 hours.  Assessment and Plan:  Left lung abscess Bronchoscopy and lavage 2/26 Fevers and night sweats Dr Cornelius Moras requesting Left lung abscess chest tube drain placement Risks and Benefits discussed with the patient including bleeding, infection, damage to adjacent structures, pneumothorax, and sepsis. All of the patient's questions were answered, patient is agreeable to proceed. Consent signed and in chart.   Thank you for this interesting consult.  I greatly enjoyed meeting Summer Howard and look forward to participating in their care.  A copy of this report was sent to the requesting provider on this date.  Electronically Signed: Ralene Muskrat  A 02/09/2016, 11:38 AM   I spent a total of 40 Minutes    in face to face in clinical consultation, greater than 50% of which was counseling/coordinating care for left lung abscess drain placement

## 2016-02-09 NOTE — Progress Notes (Signed)
Picc line found with tan drainage under biopatch.  Small amount ( 50 cent piece) size dried dng at bottom of dsg on hypofix tape.  Staff RN calling MD.

## 2016-02-09 NOTE — Progress Notes (Signed)
Paged MD about drainage from PICC site per IV nurse report, wound culture ordered, IV team to collect and send to lab

## 2016-02-09 NOTE — Progress Notes (Signed)
[address]       [place] [address]             [phone]     CARDIOTHORACIC SURGERY PROGRESS NOTE  1 Day Post-Op  S/P Procedure(s) (LRB): VIDEO BRONCHOSCOPY WITH ENDOBRONCHIAL LAVAGE (N/A)  Subjective: Feels pretty well.  Productive cough for several hours after bronch yesterday, which has since resolved.  Now back to intermittent non-productive cough.  Some low grade fever and night sweats.  Objective: Vital signs in last 24 hours: Temp:  [98 F (36.7 C)-99.3 F (37.4 C)] 98.1 F (36.7 C) (02/27 0636) Pulse Rate:  [73-102] 73 (02/27 0636) Cardiac Rhythm:  [-] Normal sinus rhythm (02/26 1255) Resp:  [8-20] 16 (02/27 0636) BP: (103-119)/(55-77) 103/62 mmHg (02/27 0636) SpO2:  [95 %-100 %] 98 % (02/27 0636)  Physical Exam:  Rhythm:   sinus  Breath sounds: Diminished on left side  Heart sounds:  RRR  Incisions:  n/a  Abdomen:  soft  Extremities:  warm   Intake/Output from previous day: 02/26 0701 - 02/27 0700 In: 980 [P.O.:480; I.V.:500] Out: 505 [Urine:500; Blood:5] Intake/Output this shift: Total I/O In: 240 [P.O.:240] Out: -   Lab Results:  Recent Labs  02/08/16 0607 02/09/16 0553  WBC 6.1 7.3  HGB 8.1* 7.6*  HCT 26.3* 24.8*  PLT 598* 587*   BMET:  Recent Labs  02/08/16 0607 02/09/16 0553  NA 138 142  K 3.1* 3.2*  CL 108 108  CO2 23 23  GLUCOSE 86 82  BUN <5* <5*  CREATININE 0.61 0.53  CALCIUM 8.1* 8.4*    CBG (last 3)  No results for input(s): GLUCAP in the last 72 hours. PT/INR:   Recent Labs  02/08/16 0607  LABPROT 13.9  INR 1.05    CXR:   CHEST 2 VIEW  COMPARISON: Chest radiograph February 08, 2016; chest CT February 03, 2016  FINDINGS: Central catheter tip is in the superior vena cava. No pneumothorax is apparent on this study. The extensive consolidation in the left lower lobe persists without appreciable change. The lucency consistent with abscess is new appreciable by radiography but  better seen by CT. This area of irregular air collection in the left lower lobe measures 3.5 x 2.8 x 2.4 cm by radiography. There is a small left pleural effusion as well. Elsewhere lungs are clear. The heart size and pulmonary vascularity appear normal. No adenopathy is demonstrable by radiography.  IMPRESSION: Persistent left lower lobe abscess with consolidation and irregular air collection, better seen on CT. Small left pleural effusion. No new opacity. No pneumothorax. Central catheter tip in superior vena cava. Cardiac silhouette within normal limits.   Electronically Signed  By: Bretta Bang III M.D.  On: 02/09/2016 07:23        Assessment/Plan: S/P Procedure(s) (LRB): VIDEO BRONCHOSCOPY WITH ENDOBRONCHIAL LAVAGE (N/A)  Patient remains clinically stable.  I have again reviewed the treatment options for management of lung abscess with the patient and her father this morning including continued medical therapy, an attempt at CT-guided percutaneous drainage, VATS for drainage, and more radical surgery (thoracotomy) for drainage and possible pulmonary resection.  Given the size and chronicity of her abscess I am skeptical that medical therapy without some type of drainage will work.  I favor an attempt at CT-guided drainage.  This should come with relatively low risk, although there is a possibility that it could result in bleeding, air leak, or spillage of abscess into the pleural space -  all of which might ultimately require surgical intervention.  However, an attempt at percutaneous drainage would be far less invasive and shouldn't adversely affect options if surgery proves to be necessary.  Patient is interested in considering this approach.  Will review case with Interventional Radiology.  All questions answered.   I spent in excess of 30 minutes during the conduct of this hospital encounter and >50% of this time involved direct face-to-face encounter with the patient  for counseling and/or coordination of their care.   Purcell Nails, MD 02/09/2016 9:46 AM

## 2016-02-09 NOTE — Progress Notes (Signed)
picc line aerobic culture obtained from under old  biopatch with the tan drainage.  New dsg, and biopatch as per flowsheet.

## 2016-02-09 NOTE — Progress Notes (Signed)
Chaplain presented to the patient's room with introduction as Chaplain. The patient, and her father were present at the time of this visit, but he excused himself from the room to allow time for the patient to share privately.  The ministry of listening presence was provided for the patient as she shared thedeath of her husband a year ago, which certainly she is still very much in the grief process as well as that of her three sons and other family members.  She also shares she has had some past and current medical issues herself which are both emotionally and spiritually challenging for her and her family at this time.  Chaplain inquired if the patient has a good support system with family, faith community, friends, and she reports that she does have good support which helps a great deal.  Chaplain offered aprayer of thanksgiving for daily needs being met, and well as present needs being provided, also a prayer for healing in every area of need being met. Chaplain will follow for on going spiritual care support. Chaplain Yaakov Guthrie [phone]

## 2016-02-10 ENCOUNTER — Inpatient Hospital Stay (HOSPITAL_COMMUNITY): Payer: Medicaid Other

## 2016-02-10 DIAGNOSIS — J851 Abscess of lung with pneumonia: Secondary | ICD-10-CM

## 2016-02-10 LAB — CULTURE, BLOOD (ROUTINE X 2)
CULTURE: NO GROWTH
Culture: NO GROWTH

## 2016-02-10 LAB — BASIC METABOLIC PANEL
ANION GAP: 9 (ref 5–15)
BUN: <5 mg/dL — ABNORMAL LOW (ref 6–20)
CO2: 25 mmol/L (ref 22–32)
Calcium: 8.5 mg/dL — ABNORMAL LOW (ref 8.9–10.3)
Chloride: 110 mmol/L (ref 101–111)
Creatinine, Ser: 0.4 mg/dL — ABNORMAL LOW (ref 0.44–1.00)
GFR calc Af Amer: >60 mL/min (ref >60)
GFR calc non Af Amer: >60 mL/min (ref >60)
GLUCOSE: 82 mg/dL (ref 65–99)
POTASSIUM: 3.7 mmol/L (ref 3.5–5.1)
Sodium: 144 mmol/L (ref 135–145)

## 2016-02-10 LAB — CULTURE, BLOOD (SINGLE): Culture: NO GROWTH

## 2016-02-10 LAB — CBC
HEMATOCRIT: 26.5 % — AB (ref 36.0–46.0)
HEMOGLOBIN: 8 g/dL — AB (ref 12.0–15.0)
MCH: 25.2 pg — AB (ref 26.0–34.0)
MCHC: 30.2 g/dL (ref 30.0–36.0)
MCV: 83.6 fL (ref 78.0–100.0)
Platelets: 629 10*3/uL — ABNORMAL HIGH (ref 150–400)
RBC: 3.17 MIL/uL — ABNORMAL LOW (ref 3.87–5.11)
RDW: 13.9 % (ref 11.5–15.5)
WBC: 6.5 10*3/uL (ref 4.0–10.5)

## 2016-02-10 MED ORDER — HYDROMORPHONE HCL 1 MG/ML IJ SOLN
0.5000 mg | Freq: Once | INTRAMUSCULAR | Status: AC
  Administered 2016-02-10: 0.5 mg via INTRAVENOUS
  Filled 2016-02-10: qty 1

## 2016-02-10 MED ORDER — FENTANYL CITRATE (PF) 100 MCG/2ML IJ SOLN
INTRAMUSCULAR | Status: AC
  Filled 2016-02-10: qty 2

## 2016-02-10 MED ORDER — HYDROMORPHONE HCL 1 MG/ML IJ SOLN: 1.0000 mg | Freq: Once | INTRAMUSCULAR | Status: AC

## 2016-02-10 MED ORDER — MIDAZOLAM HCL 2 MG/2ML IJ SOLN
INTRAMUSCULAR | Status: AC
  Filled 2016-02-10: qty 2

## 2016-02-10 MED ORDER — HYDROMORPHONE HCL 1 MG/ML IJ SOLN
INTRAMUSCULAR | Status: AC
  Filled 2016-02-10: qty 1

## 2016-02-10 MED ORDER — MIDAZOLAM HCL 2 MG/2ML IJ SOLN
INTRAMUSCULAR | Status: AC | PRN
  Administered 2016-02-10 (×4): 1 mg via INTRAVENOUS

## 2016-02-10 MED ORDER — HYDROMORPHONE HCL 1 MG/ML IJ SOLN
1.0000 mg | INTRAMUSCULAR | Status: DC | PRN
  Administered 2016-02-10 – 2016-02-18 (×44): 1 mg via INTRAVENOUS
  Filled 2016-02-10 (×43): qty 1

## 2016-02-10 MED ORDER — FENTANYL CITRATE (PF) 100 MCG/2ML IJ SOLN
INTRAMUSCULAR | Status: AC | PRN
  Administered 2016-02-10 (×3): 50 ug via INTRAVENOUS

## 2016-02-10 MED ORDER — SODIUM CHLORIDE 0.9 % IV SOLN
INTRAVENOUS | Status: AC | PRN
  Administered 2016-02-10: 10 mL/h via INTRAVENOUS

## 2016-02-10 MED ORDER — LIDOCAINE HCL 1 % IJ SOLN
INTRAMUSCULAR | Status: AC
  Filled 2016-02-10: qty 20

## 2016-02-10 NOTE — Sedation Documentation (Signed)
Patient denies pain and is resting comfortably.  

## 2016-02-10 NOTE — Progress Notes (Signed)
Utilization review complete. Kieara Schwark RN CCM Case Mgmt phone [phone] 

## 2016-02-10 NOTE — Procedures (Addendum)
CT guided placement of 14 French pigtail catheter in left upper lobe abscess.  Small amount of air was aspirated and a few drops of bloody fluid removed.  No immediate complication.  Minimal blood loss.  Drain attached to suction bulb.

## 2016-02-10 NOTE — Sedation Documentation (Signed)
Pt anxious 

## 2016-02-10 NOTE — Sedation Documentation (Signed)
Patient is resting comfortably. 

## 2016-02-10 NOTE — Progress Notes (Signed)
RT Note: D/C CPT due to drain in back. RT to monitor.

## 2016-02-10 NOTE — Sedation Documentation (Signed)
Pt. Still very aware of procedure needle.

## 2016-02-10 NOTE — Progress Notes (Signed)
PROGRESS NOTE    Summer Howard  ZOX:[id number]  DOB: [birth year]  DOA: 02/06/2016 PCP: Rafael Bihari, MD Outpatient Specialists:   Hospital course: 45 year old female patient with history of anxiety, depression, iron deficiency anemia, thrombocytopenia, transferred from [place] for evaluation of lung abscess by cardiothoracic surgery at [facility]. Status post video bronchoscopy followed by CT-guided left chest tube placement.  Assessment & Plan:   Community-acquired pneumonia complicated by lung abscess - Status post video bronchoscopy with endobronchial lavage 2/26: Cultures negative to date. Cytology from bronchial washings and brushings negative. - Continue broad-spectrum IV antibiotics and pulmonary toilet. Blood cultures 3 from 2/23: Negative to date - Nutrition consultation appreciated. - Pain management for pleuritic chest pain. Chest pain controlled. - Status post CT-guided left chest tube placement by IR on 2/28. Management per TCTS  Hemoptysis - Mild and likely secondary to lung abscess. Holding Lovenox and aspirin. Monitor closely. None reported since admission.   Iron deficiency anemia - Aspirin currently held. Monitor CBCs periodically and transfer if hemoglobin <7 g per DL. Stable.  Thrombocytosis  - ? Reactive. Fluctuating.  History of gastric ulcer - PPI.  Hypokalemia  - Replaced  DVT prophylaxis: SCD's Code Status: Full Family Communication: Discussed with patient's father at bedside on 2/28 .  Disposition Plan: DC home when medically stable   Consultants:  Cardiothoracic surgery   Procedures:  RUE PICC line 2/25. Site looks clean and dry.  Video bronchoscopy with endobronchial lavage 2/26  CT-guided left chest tube placed by IR on 2/28.  Antimicrobials:  IV Zosyn 2/23 >   IV vancomycin 2/23 >  Subjective: Seen post Left sided chest tube placement. Denied complaints.  Objective: Filed Vitals:   02/10/16 1138 02/10/16 1208 02/10/16  1233 02/10/16 1306  BP: 116/71 117/75 117/73 125/73  Pulse: 67 72 67 68  Temp: 98.4 F (36.9 C)     TempSrc: Oral     Resp: Height:      Weight:      SpO2: 98% 100% 100% 100%    Intake/Output Summary (Last 24 hours) at 02/10/16 1454 Last data filed at 02/10/16 0900  Gross per 24 hour  Intake    370 ml  Output    900 ml  Net   -530 ml   Filed Weights   02/06/16 1734  Weight: 54.432 kg (120 lb)    Exam:  General exam: Small built and frail young female sitting up comfortably in bed Respiratory system:  Reduced breath sounds left lung fields otherwise clear to auscultation. No increased work of breathing.Left chest tube + Cardiovascular system: S1 & S2 heard, RRR. No JVD, murmurs, gallops, clicks or pedal edema.   Gastrointestinal system: Abdomen is nondistended, soft and nontender. Normal bowel sounds heard. Central nervous system: Alert and oriented. No focal neurological deficits. Extremities: Symmetric 5 x 5 power.   Data Reviewed: Basic Metabolic Panel:  Recent Labs Lab 02/06/16 0702 02/07/16 0300 02/08/16 0607 02/09/16 0553 02/10/16 0420  NA 137 139 138 142 144  K 3.8 3.9 3.1* 3.2* 3.7  CL 103 107 108 108 110  CO2 GLUCOSE 81 85 86 82 82  BUN <5* <5* <5* <5* <5*  CREATININE 0.47 0.62 0.61 0.53 0.40*  CALCIUM 8.7* 8.3* 8.1* 8.4* 8.5*  MG  --  1.8  --   --   --   PHOS  --  3.6  --   --   --  Liver Function Tests:  Recent Labs Lab 02/07/16 0300  AST 16  ALT 11*  ALKPHOS 96  BILITOT 0.5  PROT 5.8*  ALBUMIN 1.9*   No results for input(s): LIPASE, AMYLASE in the last 168 hours. No results for input(s): AMMONIA in the last 168 hours. CBC:  Recent Labs Lab 02/06/16 0702 02/07/16 0300 02/08/16 0607 02/09/16 0553 02/10/16 0420  WBC 6.5 4.3 6.1 7.3 6.5  NEUTROABS  --  2.9  --   --   --   HGB 9.9* 8.4* 8.1* 7.6* 8.0*  HCT 30.4* 27.2* 26.3* 24.8* 26.5*  MCV 81.4 85.0 83.8 83.2 83.6  PLT 804* 552* 598* 587* 629*    Cardiac Enzymes: No results for input(s): CKTOTAL, CKMB, CKMBINDEX, TROPONINI in the last 168 hours. BNP (last 3 results) No results for input(s): PROBNP in the last 8760 hours. CBG: No results for input(s): GLUCAP in the last 168 hours.  Recent Results (from the past 240 hour(s))  Blood culture (single)     Status: None   Collection Time: 02/05/16  9:30 AM  Result Value Ref Range Status   Specimen Description BLOOD RIGHT ASSIST CONTROL  Final   Special Requests BOTTLES DRAWN AEROBIC AND ANAEROBIC 1CCAERO,1CCANA  Final   Culture NO GROWTH 5 DAYS  Final   Report Status 02/10/2016 FINAL  Final  Culture, blood (routine x 2)     Status: None   Collection Time: 02/05/16 10:27 AM  Result Value Ref Range Status   Specimen Description BLOOD RIGHT HAND  Final   Special Requests BOTTLES DRAWN AEROBIC AND ANAEROBIC 0.5ML  Final   Culture NO GROWTH 5 DAYS  Final   Report Status 02/10/2016 FINAL  Final  Culture, blood (routine x 2)     Status: None   Collection Time: 02/05/16 10:30 AM  Result Value Ref Range Status   Specimen Description BLOOD LEFT AC  Final   Special Requests BOTTLES DRAWN AEROBIC AND ANAEROBIC 0.5ML  Final   Culture NO GROWTH 5 DAYS  Final   Report Status 02/10/2016 FINAL  Final  C difficile quick scan w PCR reflex     Status: None   Collection Time: 02/06/16  6:00 AM  Result Value Ref Range Status   C Diff antigen NEGATIVE NEGATIVE Final   C Diff toxin NEGATIVE NEGATIVE Final   C Diff interpretation Negative for C. difficile  Final  MRSA PCR Screening     Status: None   Collection Time: 02/08/16  9:14 AM  Result Value Ref Range Status   MRSA by PCR NEGATIVE NEGATIVE Final    Comment:        The GeneXpert MRSA Assay (FDA approved for NASAL specimens only), is one component of a comprehensive MRSA colonization surveillance program. It is not intended to diagnose MRSA infection nor to guide or monitor treatment for MRSA infections.   Acid Fast Smear (AFB)      Status: None   Collection Time: 02/08/16 11:40 AM  Result Value Ref Range Status   AFB Specimen Processing Concentration  Final   Acid Fast Smear Negative  Final    Comment: (NOTE) Performed At: [facility] [address] [place], [place] [id number] Mila Homer MD XB:[id number]    Source (AFB) BRONCHIAL WASHINGS  Final    Comment: LEFT UPPER LUNG   Culture, respiratory (NON-Expectorated)     Status: None (Preliminary result)   Collection Time: 02/08/16 11:40 AM  Result Value Ref Range Status   Specimen Description BRONCHIAL  WASHINGS LEFT UPPER LUNG  Final   Special Requests   Final    BRONCHIAL WASHING LINGULAR SEGMENT LEFT UPPER LOBE POF ZOSYN   Gram Stain   Final    MODERATE WBC PRESENT,BOTH PMN AND MONONUCLEAR RARE SQUAMOUS EPITHELIAL CELLS PRESENT NO ORGANISMS SEEN Performed at [facility]    Culture   Final    NO GROWTH 1 DAY Performed at [facility]    Report Status PENDING  Incomplete  Wound culture     Status: None (Preliminary result)   Collection Time: 02/09/16  6:01 AM  Result Value Ref Range Status   Specimen Description WOUND  Final   Special Requests   Final    PICC LINE SITE PATIENT ON FOLLOWING VANCOMYCIN,ZOSYN   Gram Stain   Final    RARE WBC PRESENT, PREDOMINANTLY PMN NO SQUAMOUS EPITHELIAL CELLS SEEN NO ORGANISMS SEEN Performed at [facility]    Culture NO GROWTH Performed at [facility]   Final   Report Status PENDING  Incomplete  Body fluid culture     Status: None (Preliminary result)   Collection Time: 02/10/16 11:01 AM  Result Value Ref Range Status   Specimen Description FLUID LEFT LUNG  Final   Special Requests LEFT UPPER LOBE  Final   Gram Stain PENDING  Incomplete   Culture PENDING  Incomplete   Report Status PENDING  Incomplete         Studies: Dg Chest 2 View  02/09/2016  CLINICAL DATA:  Lung abscess. EXAM: CHEST  2 VIEW COMPARISON:  Chest radiograph February 08, 2016; chest  CT February 03, 2016 FINDINGS: Central catheter tip is in the superior vena cava. No pneumothorax is apparent on this study. The extensive consolidation in the left lower lobe persists without appreciable change. The lucency consistent with abscess is new appreciable by radiography but better seen by CT. This area of irregular air collection in the left lower lobe measures 3.5 x 2.8 x 2.4 cm by radiography. There is a small left pleural effusion as well. Elsewhere lungs are clear. The heart size and pulmonary vascularity appear normal. No adenopathy is demonstrable by radiography. IMPRESSION: Persistent left lower lobe abscess with consolidation and irregular air collection, better seen on CT. Small left pleural effusion. No new opacity. No pneumothorax. Central catheter tip in superior vena cava. Cardiac silhouette within normal limits. Electronically Signed   By: Bretta Bang III M.D.   On: 02/09/2016 07:23   Ct Image Guided Fluid Drain By Catheter  02/10/2016  INDICATION: 45 year old with pneumonia and left lung abscess. Request for percutaneous drainage of this abscess collection. EXAM: CT-GUIDED PLACEMENT OF DRAINAGE CATHETER IN THE LEFT LUNG ABSCESS MEDICATIONS: The patient is currently admitted to the hospital and receiving intravenous antibiotics. The antibiotics were administered within an appropriate time frame prior to the initiation of the procedure. ANESTHESIA/SEDATION: Fentanyl 150 mcg IV; Versed 4.0 mg IV Moderate Sedation Time:  The patient was continuously monitored during the procedure by the interventional radiology nurse under my direct supervision. COMPLICATIONS: None immediate. PROCEDURE: Informed written consent was obtained from the patient after a thorough discussion of the procedural risks, benefits and alternatives. All questions were addressed. Maximal Sterile Barrier Technique was utilized including caps, mask, sterile gowns, sterile gloves, sterile drape, hand hygiene  and skin antiseptic. A timeout was performed prior to the initiation of the procedure. Patient was placed on the CT scanner and the left side of the chest was elevated. Images through  the chest were obtained. The gas-filled collection in the left upper lobe was targeted. The left side of the chest was prepped with chlorhexidine. Sterile field was created. Skin was anesthetized with 1% lidocaine. A Yueh catheter was directed into the pleural space with CT guidance. Small amount air was aspirated. A stiff Amplatz wire was advanced through the Yueh catheter. The tract was dilated to accommodate a 14 French pigtail catheter. The catheter was advanced over the wire without complication. Small amount of additional air was aspirated. Initially, no fluid was aspirated. Eventually, a small amount of bloody fluid was draining from the catheter. Initially, the catheter was attached to a suction bulb and then switched to a Pleur-Evac chest tube drain. Catheter was sutured to the skin. FINDINGS: Large complex air-fluid structure in the left upper lobe compatible with an abscess. There is surrounding consolidation. Volume loss and consolidation in left lower lobe. Catheter was placed within the air-fluid collection. Decompression of abscess following drain placement. Minimal output from the drain. IMPRESSION: CT-guided placement of a drainage catheter within the left lung abscess collection. Electronically Signed   By: Richarda Overlie M.D.   On: 02/10/2016 12:52        Scheduled Meds: . acidophilus  2 capsule Oral Daily  . enoxaparin (LOVENOX) injection  30 mg Subcutaneous Q24H  . feeding supplement  1 Container Oral TID BM  . fentaNYL      . fentaNYL      . ferrous fumarate-b12-vitamic C-folic acid  1 capsule Oral TID PC  . guaiFENesin  1,200 mg Oral BID  . lidocaine      . midazolam      . midazolam      . pantoprazole  40 mg Oral Daily  . piperacillin-tazobactam (ZOSYN)  IV  3.375 g Intravenous Q8H  .  saccharomyces boulardii  250 mg Oral BID  . sodium chloride flush  3 mL Intravenous Q12H  . sodium chloride flush  3 mL Intravenous Q12H  . sucralfate  1 g Oral TID WC  . vancomycin  1,000 mg Intravenous Q12H   Continuous Infusions:   Principal Problem:   Lung abscess (HCC) Active Problems:   Absolute anemia   Chronic gastrojejunal ulcer   Malnutrition following gastrointestinal surgery   Primary thrombocytopenia (HCC)   Pneumonia   Pleuritic chest pain   Chest pain with low risk for cardiac etiology   Necrotizing pneumonia (HCC)   Pleural effusion   Iron deficiency anemia   Hemoptysis   CAP (community acquired pneumonia)   Protein-calorie malnutrition (HCC)    Time spent: 20 minutes.    Marcellus Scott, MD, FACP, FHM. [facility] Pager [phone]  If 7PM-7AM, please contact night-coverage [email or web address] Password [email or web address] 02/10/2016, 2:54 PM    LOS: 4 days

## 2016-02-10 NOTE — Progress Notes (Addendum)
      [address]       [place] [address]             [phone]     CARDIOTHORACIC SURGERY PROGRESS NOTE  2 Days Post-Op  S/P Procedure(s) (LRB): VIDEO BRONCHOSCOPY WITH ENDOBRONCHIAL LAVAGE (N/A)  Subjective: Mild pain left posterior chest, cough productive of small amount dark sputum  Objective: Vital signs in last 24 hours: Temp:  [97.9 F (36.6 C)-98.4 F (36.9 C)] 98.4 F (36.9 C) (02/28 1138) Pulse Rate:  [55-93] 68 (02/28 1306) Cardiac Rhythm:  [-] Normal sinus rhythm (02/28 0911) Resp:  [12-22] 20 (02/28 1306) BP: (103-129)/(51-81) 125/73 mmHg (02/28 1306) SpO2:  [97 %-100 %] 100 % (02/28 1306)  Physical Exam:  Rhythm:   sinus  Breath sounds: Diminished on left w/ scattered rhonchi  Heart sounds:  RRR  Incisions:  n/a  Abdomen:  Soft, non-distended, non-tender  Extremities:  Warm, well-perfused  Chest tube:  Small amount bloody fluid output, no air leak  Intake/Output from previous day: 02/27 0701 - 02/28 0700 In: 1090 [P.O.:1080; I.V.:10] Out: 1050 [Urine:1050] Intake/Output this shift: Total I/O In: 0  Out: 200 [Urine:200]  Lab Results:  Recent Labs  02/09/16 0553 02/10/16 0420  WBC 7.3 6.5  HGB 7.6* 8.0*  HCT 24.8* 26.5*  PLT 587* 629*   BMET:  Recent Labs  02/09/16 0553 02/10/16 0420  NA 142 144  K 3.2* 3.7  CL 108 110  CO2 23 25  GLUCOSE 82 82  BUN <5* <5*  CREATININE 0.53 0.40*  CALCIUM 8.4* 8.5*    CBG (last 3)  No results for input(s): GLUCAP in the last 72 hours. PT/INR:   Recent Labs  02/08/16 0607  LABPROT 13.9  INR 1.05    CXR:  N/A  Assessment/Plan: S/P Procedure(s) (LRB): VIDEO BRONCHOSCOPY WITH ENDOBRONCHIAL LAVAGE (N/A)  Doing well since CT-guided placement of chest tube earlier today.  No obvious air leak on exam.  Bronchial washings from 2/26 remain "no growth" so far.  Cytology from bronchial washings and brushings negative.  Will continue to follow closely and check repeat CXR in  am.  I spent in excess of 15 minutes during the conduct of this hospital encounter and >50% of this time involved direct face-to-face encounter with the patient for counseling and/or coordination of their care.  Purcell Nails, MD 02/10/2016 2:28 PM

## 2016-02-11 ENCOUNTER — Inpatient Hospital Stay (HOSPITAL_COMMUNITY): Payer: Medicaid Other

## 2016-02-11 DIAGNOSIS — R079 Chest pain, unspecified: Secondary | ICD-10-CM

## 2016-02-11 LAB — CULTURE, RESPIRATORY: CULTURE: NO GROWTH

## 2016-02-11 LAB — QUANTIFERON IN TUBE
QFT TB AG MINUS NIL VALUE: 0.02 IU/mL
QUANTIFERON MITOGEN VALUE: >10 IU/mL
QUANTIFERON TB AG VALUE: 0.38 [IU]/mL
QUANTIFERON TB GOLD: NEGATIVE
Quantiferon Nil Value: 0.36 IU/mL

## 2016-02-11 LAB — CULTURE, RESPIRATORY W GRAM STAIN

## 2016-02-11 LAB — QUANTIFERON TB GOLD ASSAY (BLOOD)

## 2016-02-11 LAB — VANCOMYCIN, TROUGH: VANCOMYCIN TR: 9 ug/mL — AB (ref 10.0–20.0)

## 2016-02-11 MED ORDER — VANCOMYCIN HCL IN DEXTROSE 750-5 MG/150ML-% IV SOLN
750.0000 mg | Freq: Three times a day (TID) | INTRAVENOUS | Status: DC
  Administered 2016-02-11 – 2016-02-16 (×16): 750 mg via INTRAVENOUS
  Filled 2016-02-11 (×18): qty 150

## 2016-02-11 MED ORDER — VANCOMYCIN HCL 500 MG IV SOLR
500.0000 mg | INTRAVENOUS | Status: AC
  Administered 2016-02-11: 500 mg via INTRAVENOUS
  Filled 2016-02-11: qty 500

## 2016-02-11 MED ORDER — FLUCONAZOLE 100 MG PO TABS
150.0000 mg | ORAL_TABLET | Freq: Once | ORAL | Status: AC
Start: 2016-02-11 — End: 2016-02-11
  Administered 2016-02-11: 150 mg via ORAL
  Filled 2016-02-11: qty 2

## 2016-02-11 NOTE — Progress Notes (Signed)
[facility] PROGRESS NOTE  ERIK NESSEL ION:[id number] DOB: Aug 25, 1971 DOA: 02/06/2016 PCP: Rafael Bihari, MD  HPI/Brief narrative 45 year old female patient with history of anxiety, depression, iron deficiency anemia, thrombocytopenia, transferred from [facility] for evaluation of lung abscess by cardiothoracic surgery at [facility]. Status post video bronchoscopy followed by CT-guided left chest tube placement.  Assessment/Plan: Community-acquired pneumonia complicated by lung abscess - Status post video bronchoscopy with endobronchial lavage 2/26: Cultures negative to date. Cytology from bronchial washings and brushings negative. - Patient is continued on broad-spectrum IV antibiotics and pulmonary toilet. Blood cultures 3 from 2/23: Negative to date - Nutrition consultation appreciated. - Pain management for pleuritic chest pain. Chest pain controlled. - Status post CT-guided left chest tube placement by IR on 2/28. Management per TCTS. Minimal output thus far  Hemoptysis - Mild and likely secondary to lung abscess. Holding Lovenox and aspirin. Monitor closely. None reported since admission.   Iron deficiency anemia - Aspirin currently held. Monitor CBCs periodically and transfer if hemoglobin <7 g per DL. Stable.  Thrombocytosis  - ? Reactive. Fluctuating.  History of gastric ulcer - PPI.  Hypokalemia - Replaced  Code Status: Full Family Communication: Pt in room, father at bedside Disposition Plan: plan d/c when cleared by CTS   Consultants:  CT surgery  Procedures:  RUE PICC line 2/25. Site looks clean and dry.  Video bronchoscopy with endobronchial lavage 2/26  CT-guided left chest tube placed by IR on 2/28.  Antibiotics: Anti-infectives    Start     Dose/Rate Route Frequency Ordered Stop   02/11/16 1030  fluconazole (DIFLUCAN) tablet 150 mg     150 mg Oral  Once 02/11/16 1017 02/11/16 1111   02/08/16 0200  vancomycin (VANCOCIN) IVPB 1000 mg/200 mL premix      1,000 mg 200 mL/hr over 60 Minutes Intravenous Every 12 hours 02/07/16 1306     02/07/16 1315  vancomycin (VANCOCIN) 500 mg in sodium chloride 0.9 % 100 mL IVPB     500 mg 100 mL/hr over 60 Minutes Intravenous  Once 02/07/16 1306 02/07/16 1506   02/06/16 2300  vancomycin (VANCOCIN) IVPB 750 mg/150 ml premix  Status:  Discontinued     750 mg 150 mL/hr over 60 Minutes Intravenous Every 12 hours 02/06/16 1915 02/07/16 1306   02/06/16 2300  piperacillin-tazobactam (ZOSYN) IVPB 3.375 g     3.375 g 12.5 mL/hr over 240 Minutes Intravenous Every 8 hours 02/06/16 1915        HPI/Subjective: Complains of mild chest discomfort at site of chest tube  Objective: Filed Vitals:   02/10/16 2154 02/11/16 0631 02/11/16 1110 02/11/16 1439  BP: 120/64 115/66  133/80  Pulse: 73 87  88  Temp: 97.9 F (36.6 C) 98.2 F (36.8 C)  99 F (37.2 C)  TempSrc: Oral   Oral  Resp: Height:      Weight:   51.755 kg (114 lb 1.6 oz)   SpO2: 98% 97%  98%    Intake/Output Summary (Last 24 hours) at 02/11/16 1529 Last data filed at 02/11/16 1439  Gross per 24 hour  Intake    610 ml  Output   2900 ml  Net  -2290 ml   Filed Weights   02/06/16 1734 02/11/16 1110  Weight: 54.432 kg (120 lb) 51.755 kg (114 lb 1.6 oz)    Exam:   General:  Awake, in nad  Cardiovascular: regular, s1, s2  Respiratory: normal resp effort, no wheezing  Abdomen: soft, nondistended  Musculoskeletal: perfused,no clubbing   Data Reviewed: Basic Metabolic Panel:  Recent Labs Lab 02/06/16 0702 02/07/16 0300 02/08/16 0607 02/09/16 0553 02/10/16 0420  NA 137 139 138 142 144  K 3.8 3.9 3.1* 3.2* 3.7  CL 103 107 108 108 110  CO2 28 23 23 23 25   GLUCOSE 81 85 86 82 82  BUN <5* <5* <5* <5* <5*  CREATININE 0.47 0.62 0.61 0.53 0.40*  CALCIUM 8.7* 8.3* 8.1* 8.4* 8.5*  MG  --  1.8  --   --   --   PHOS  --  3.6  --   --   --    Liver Function Tests:  Recent Labs Lab 02/07/16 0300  AST 16  ALT 11*   ALKPHOS 96  BILITOT 0.5  PROT 5.8*  ALBUMIN 1.9*   No results for input(s): LIPASE, AMYLASE in the last 168 hours. No results for input(s): AMMONIA in the last 168 hours. CBC:  Recent Labs Lab 02/06/16 0702 02/07/16 0300 02/08/16 0607 02/09/16 0553 02/10/16 0420  WBC 6.5 4.3 6.1 7.3 6.5  NEUTROABS  --  2.9  --   --   --   HGB 9.9* 8.4* 8.1* 7.6* 8.0*  HCT 30.4* 27.2* 26.3* 24.8* 26.5*  MCV 81.4 85.0 83.8 83.2 83.6  PLT 804* 552* 598* 587* 629*   Cardiac Enzymes: No results for input(s): CKTOTAL, CKMB, CKMBINDEX, TROPONINI in the last 168 hours. BNP (last 3 results) No results for input(s): BNP in the last 8760 hours.  ProBNP (last 3 results) No results for input(s): PROBNP in the last 8760 hours.  CBG: No results for input(s): GLUCAP in the last 168 hours.  Recent Results (from the past 240 hour(s))  Blood culture (single)     Status: None   Collection Time: 02/05/16  9:30 AM  Result Value Ref Range Status   Specimen Description BLOOD RIGHT ASSIST CONTROL  Final   Special Requests BOTTLES DRAWN AEROBIC AND ANAEROBIC 1CCAERO,1CCANA  Final   Culture NO GROWTH 5 DAYS  Final   Report Status 02/10/2016 FINAL  Final  Culture, blood (routine x 2)     Status: None   Collection Time: 02/05/16 10:27 AM  Result Value Ref Range Status   Specimen Description BLOOD RIGHT HAND  Final   Special Requests BOTTLES DRAWN AEROBIC AND ANAEROBIC 0.5ML  Final   Culture NO GROWTH 5 DAYS  Final   Report Status 02/10/2016 FINAL  Final  Culture, blood (routine x 2)     Status: None   Collection Time: 02/05/16 10:30 AM  Result Value Ref Range Status   Specimen Description BLOOD LEFT AC  Final   Special Requests BOTTLES DRAWN AEROBIC AND ANAEROBIC 0.5ML  Final   Culture NO GROWTH 5 DAYS  Final   Report Status 02/10/2016 FINAL  Final  C difficile quick scan w PCR reflex     Status: None   Collection Time: 02/06/16  6:00 AM  Result Value Ref Range Status   C Diff antigen NEGATIVE  NEGATIVE Final   C Diff toxin NEGATIVE NEGATIVE Final   C Diff interpretation Negative for C. difficile  Final  MRSA PCR Screening     Status: None   Collection Time: 02/08/16  9:14 AM  Result Value Ref Range Status   MRSA by PCR NEGATIVE NEGATIVE Final    Comment:        The GeneXpert MRSA Assay (FDA approved for NASAL specimens only), is one component of  a comprehensive MRSA colonization surveillance program. It is not intended to diagnose MRSA infection nor to guide or monitor treatment for MRSA infections.   Acid Fast Smear (AFB)     Status: None   Collection Time: 02/08/16 11:40 AM  Result Value Ref Range Status   AFB Specimen Processing Concentration  Final   Acid Fast Smear Negative  Final    Comment: (NOTE) Performed At: [facility] [address] [place], [place] [id number] Mila Homer MD WU:[id number]    Source (AFB) BRONCHIAL WASHINGS  Final    Comment: LEFT UPPER LUNG   Culture, respiratory (NON-Expectorated)     Status: None   Collection Time: 02/08/16 11:40 AM  Result Value Ref Range Status   Specimen Description BRONCHIAL WASHINGS LEFT UPPER LUNG  Final   Special Requests   Final    BRONCHIAL WASHING LINGULAR SEGMENT LEFT UPPER LOBE POF ZOSYN   Gram Stain   Final    MODERATE WBC PRESENT,BOTH PMN AND MONONUCLEAR RARE SQUAMOUS EPITHELIAL CELLS PRESENT NO ORGANISMS SEEN Performed at [facility]    Culture   Final    NO GROWTH 2 DAYS Performed at [facility]    Report Status 02/11/2016 FINAL  Final  Wound culture     Status: None (Preliminary result)   Collection Time: 02/09/16  6:01 AM  Result Value Ref Range Status   Specimen Description WOUND  Final   Special Requests   Final    PICC LINE SITE PATIENT ON FOLLOWING VANCOMYCIN,ZOSYN   Gram Stain   Final    RARE WBC PRESENT, PREDOMINANTLY PMN NO SQUAMOUS EPITHELIAL CELLS SEEN NO ORGANISMS SEEN Performed at [facility]    Culture   Final    NO GROWTH 1  DAY Performed at [facility]    Report Status PENDING  Incomplete  Body fluid culture     Status: None (Preliminary result)   Collection Time: 02/10/16 11:01 AM  Result Value Ref Range Status   Specimen Description FLUID LEFT LUNG  Final   Special Requests LEFT UPPER LOBE  Final   Gram Stain   Final    ABUNDANT WBC PRESENT,BOTH PMN AND MONONUCLEAR NO ORGANISMS SEEN    Culture NO GROWTH < 24 HOURS  Final   Report Status PENDING  Incomplete     Studies: Dg Chest 2 View  02/11/2016  CLINICAL DATA:  Left lung abscess with small caliber urge caliber chest tube placement. EXAM: CHEST  2 VIEW COMPARISON:  CT scan of the chest and chest x-ray of February 28 and February 09, 2016. FINDINGS: The small caliber chest tube projects over the lower third of the left hemi thorax. There is pleural fluid lateral and inferior to this. There is no pneumothorax. There is no mediastinal shift. The right lung is clear. The heart and pulmonary vascularity are normal. The PICC line tip projects over the midportion of the SVC. The bony thorax is unremarkable. IMPRESSION: The small caliber chest tube lies adjacent to the area of parenchymal abnormality in the left lower lung. Correlation with the functioning of the tube is needed. Overall there has not been significant change in the appearance of the empyema. Elsewhere the chest is unremarkable. Electronically Signed   By: David  [place] M.D.   On: 02/11/2016 08:00   Ct Image Guided Fluid Drain By Catheter  02/10/2016  INDICATION: 45 year old with pneumonia and left lung abscess. Request for percutaneous drainage of this abscess collection. EXAM: CT-GUIDED PLACEMENT OF DRAINAGE  CATHETER IN THE LEFT LUNG ABSCESS MEDICATIONS: The patient is currently admitted to the hospital and receiving intravenous antibiotics. The antibiotics were administered within an appropriate time frame prior to the initiation of the procedure. ANESTHESIA/SEDATION: Fentanyl 150 mcg IV;  Versed 4.0 mg IV Moderate Sedation Time:  The patient was continuously monitored during the procedure by the interventional radiology nurse under my direct supervision. COMPLICATIONS: None immediate. PROCEDURE: Informed written consent was obtained from the patient after a thorough discussion of the procedural risks, benefits and alternatives. All questions were addressed. Maximal Sterile Barrier Technique was utilized including caps, mask, sterile gowns, sterile gloves, sterile drape, hand hygiene and skin antiseptic. A timeout was performed prior to the initiation of the procedure. Patient was placed on the CT scanner and the left side of the chest was elevated. Images through the chest were obtained. The gas-filled collection in the left upper lobe was targeted. The left side of the chest was prepped with chlorhexidine. Sterile field was created. Skin was anesthetized with 1% lidocaine. A Yueh catheter was directed into the pleural space with CT guidance. Small amount air was aspirated. A stiff Amplatz wire was advanced through the Yueh catheter. The tract was dilated to accommodate a 14 French pigtail catheter. The catheter was advanced over the wire without complication. Small amount of additional air was aspirated. Initially, no fluid was aspirated. Eventually, a small amount of bloody fluid was draining from the catheter. Initially, the catheter was attached to a suction bulb and then switched to a Pleur-Evac chest tube drain. Catheter was sutured to the skin. FINDINGS: Large complex air-fluid structure in the left upper lobe compatible with an abscess. There is surrounding consolidation. Volume loss and consolidation in left lower lobe. Catheter was placed within the air-fluid collection. Decompression of abscess following drain placement. Minimal output from the drain. IMPRESSION: CT-guided placement of a drainage catheter within the left lung abscess collection. Electronically Signed   By: Richarda Overlie M.D.   On: 02/10/2016 12:52    Scheduled Meds: . acidophilus  2 capsule Oral Daily  . enoxaparin (LOVENOX) injection  30 mg Subcutaneous Q24H  . feeding supplement  1 Container Oral TID BM  . ferrous fumarate-b12-vitamic C-folic acid  1 capsule Oral TID PC  . guaiFENesin  1,200 mg Oral BID  . pantoprazole  40 mg Oral Daily  . piperacillin-tazobactam (ZOSYN)  IV  3.375 g Intravenous Q8H  . saccharomyces boulardii  250 mg Oral BID  . sodium chloride flush  3 mL Intravenous Q12H  . sodium chloride flush  3 mL Intravenous Q12H  . sucralfate  1 g Oral TID WC  . vancomycin  1,000 mg Intravenous Q12H   Continuous Infusions:   Principal Problem:   Lung abscess (HCC) Active Problems:   Absolute anemia   Chronic gastrojejunal ulcer   Malnutrition following gastrointestinal surgery   Primary thrombocytopenia (HCC)   Pneumonia   Pleuritic chest pain   Chest pain with low risk for cardiac etiology   Necrotizing pneumonia (HCC)   Pleural effusion   Iron deficiency anemia   Hemoptysis   CAP (community acquired pneumonia)   Protein-calorie malnutrition (HCC)    CHIU, STEPHEN K  [facility] Pager [phone]. If 7PM-7AM, please contact night-coverage at [email or web address], password [facility] 02/11/2016, 3:29 PM  LOS: 5 days

## 2016-02-11 NOTE — Progress Notes (Signed)
Referring Physician(s): Oaks,Timothy Dr Barry Dienes  Supervising Physician: Dr Lowella Dandy  Chief Complaint:  Left lung abscess Left lung chest tube drain placed 2/28 in IR   Subjective:  Up in chair today Some better Output in pleurvac chamber small amount Purulent in tubing   Allergies: Phenobarbital and Sulfa antibiotics  Medications: Prior to Admission medications   Medication Sig Start Date End Date Taking? Authorizing Provider  acetaminophen (TYLENOL) 500 MG tablet Take 1,000 mg by mouth every 6 (six) hours as needed for mild pain, fever or headache.   Yes Historical Provider, MD  acidophilus (RISAQUAD) CAPS capsule Take 2 capsules by mouth daily. 02/06/16  Yes Enedina Finner, MD  aspirin EC 81 MG tablet Take 1 tablet (81 mg total) by mouth daily. 01/23/16  Yes Shaune Pollack, MD  calcium carbonate (TUMS - DOSED IN MG ELEMENTAL CALCIUM) 500 MG chewable tablet Chew 2 tablets by mouth as needed for indigestion or heartburn.   Yes Historical Provider, MD  Multiple Vitamin (MULTIVITAMIN WITH MINERALS) TABS tablet Take 1 tablet by mouth daily.   Yes Historical Provider, MD  ondansetron (ZOFRAN) 4 MG tablet Take 4 mg by mouth every 8 (eight) hours as needed for nausea or vomiting.    Yes Historical Provider, MD  pantoprazole (PROTONIX) 40 MG tablet Take 40 mg by mouth daily.    Yes Historical Provider, MD  sucralfate (CARAFATE) 1 GM/10ML suspension Take 1 g by mouth 3 (three) times daily with meals.   Yes Historical Provider, MD  piperacillin-tazobactam (ZOSYN) 3.375 GM/50ML IVPB Inject 50 mLs (3.375 g total) into the vein every 8 (eight) hours. 02/06/16   Enedina Finner, MD  Vancomycin (VANCOCIN) 750 MG/150ML SOLN Inject 150 mLs (750 mg total) into the vein every 12 (twelve) hours. 02/06/16   Enedina Finner, MD     Vital Signs: BP 115/66 mmHg  Pulse 87  Temp(Src) 98.2 F (36.8 C) (Oral)  Resp 17  Ht 5\' 2"  (1.575 m)  Wt 120 lb (54.432 kg)  BMI 21.94 kg/m2  SpO2 97%  Physical Exam    Pulmonary/Chest: Effort normal. She has wheezes.  Skin: Skin is warm and dry.  Site of L chest tube placement is clean and dry Tender slightly No bleeding  ++ air leak Few wheezes on Left Purulent output in tubing Bloody output in pleurvac---small amount   Nursing note and vitals reviewed.  CXR this am: IMPRESSION: The small caliber chest tube lies adjacent to the area of parenchymal abnormality in the left lower lung. Correlation with the functioning of the tube is needed. Overall there has not been significant change in the appearance of the empyema. Elsewhere the chest is unremarkable   Imaging: Dg Chest 2 View  02/11/2016  CLINICAL DATA:  Left lung abscess with small caliber urge caliber chest tube placement. EXAM: CHEST  2 VIEW COMPARISON:  CT scan of the chest and chest x-ray of February 28 and February 09, 2016. FINDINGS: The small caliber chest tube projects over the lower third of the left hemi thorax. There is pleural fluid lateral and inferior to this. There is no pneumothorax. There is no mediastinal shift. The right lung is clear. The heart and pulmonary vascularity are normal. The PICC line tip projects over the midportion of the SVC. The bony thorax is unremarkable. IMPRESSION: The small caliber chest tube lies adjacent to the area of parenchymal abnormality in the left lower lung. Correlation with the functioning of the tube is needed. Overall there has not been significant  change in the appearance of the empyema. Elsewhere the chest is unremarkable. Electronically Signed   By: David  [place] M.D.   On: 02/11/2016 08:00   Dg Chest 2 View  02/09/2016  CLINICAL DATA:  Lung abscess. EXAM: CHEST  2 VIEW COMPARISON:  Chest radiograph February 08, 2016; chest CT February 03, 2016 FINDINGS: Central catheter tip is in the superior vena cava. No pneumothorax is apparent on this study. The extensive consolidation in the left lower lobe persists without appreciable change. The lucency  consistent with abscess is new appreciable by radiography but better seen by CT. This area of irregular air collection in the left lower lobe measures 3.5 x 2.8 x 2.4 cm by radiography. There is a small left pleural effusion as well. Elsewhere lungs are clear. The heart size and pulmonary vascularity appear normal. No adenopathy is demonstrable by radiography. IMPRESSION: Persistent left lower lobe abscess with consolidation and irregular air collection, better seen on CT. Small left pleural effusion. No new opacity. No pneumothorax. Central catheter tip in superior vena cava. Cardiac silhouette within normal limits. Electronically Signed   By: Bretta Bang III M.D.   On: 02/09/2016 07:23   Dg Chest 2 View  02/08/2016  CLINICAL DATA:  Atelectasis, lung abscess, pneumonia EXAM: CHEST  2 VIEW COMPARISON:  02/03/2016 CT FINDINGS: Left pleural effusion with left basilar airspace opacity not significantly changed. Right lung is clear. Right PICC line is in place with the tip in the SVC. Heart is normal size. IMPRESSION: Continued left pleural effusion and left basilar airspace disease, not significantly changed. Electronically Signed   By: Charlett Nose M.D.   On: 02/08/2016 07:49   Dg Chest Port 1 View  02/08/2016  CLINICAL DATA:  Lung abscess post bronchoscopy EXAM: PORTABLE CHEST 1 VIEW COMPARISON:  02/07/2015 FINDINGS: No pneumothorax following bronchoscopy. Continued left lower lobe opacity and left effusion. Right lung is clear. Right PICC line is unchanged. Heart is normal size. IMPRESSION: Continued left lower lobe opacity and left effusion. No pneumothorax following bronchoscopy. Electronically Signed   By: Charlett Nose M.D.   On: 02/08/2016 12:19   Ct Image Guided Fluid Drain By Catheter  02/10/2016  INDICATION: 45 year old with pneumonia and left lung abscess. Request for percutaneous drainage of this abscess collection. EXAM: CT-GUIDED PLACEMENT OF DRAINAGE CATHETER IN THE LEFT LUNG ABSCESS  MEDICATIONS: The patient is currently admitted to the hospital and receiving intravenous antibiotics. The antibiotics were administered within an appropriate time frame prior to the initiation of the procedure. ANESTHESIA/SEDATION: Fentanyl 150 mcg IV; Versed 4.0 mg IV Moderate Sedation Time:  The patient was continuously monitored during the procedure by the interventional radiology nurse under my direct supervision. COMPLICATIONS: None immediate. PROCEDURE: Informed written consent was obtained from the patient after a thorough discussion of the procedural risks, benefits and alternatives. All questions were addressed. Maximal Sterile Barrier Technique was utilized including caps, mask, sterile gowns, sterile gloves, sterile drape, hand hygiene and skin antiseptic. A timeout was performed prior to the initiation of the procedure. Patient was placed on the CT scanner and the left side of the chest was elevated. Images through the chest were obtained. The gas-filled collection in the left upper lobe was targeted. The left side of the chest was prepped with chlorhexidine. Sterile field was created. Skin was anesthetized with 1% lidocaine. A Yueh catheter was directed into the pleural space with CT guidance. Small amount air was aspirated. A stiff Amplatz wire was advanced through the Yueh catheter.  The tract was dilated to accommodate a 14 French pigtail catheter. The catheter was advanced over the wire without complication. Small amount of additional air was aspirated. Initially, no fluid was aspirated. Eventually, a small amount of bloody fluid was draining from the catheter. Initially, the catheter was attached to a suction bulb and then switched to a Pleur-Evac chest tube drain. Catheter was sutured to the skin. FINDINGS: Large complex air-fluid structure in the left upper lobe compatible with an abscess. There is surrounding consolidation. Volume loss and consolidation in left lower lobe. Catheter was  placed within the air-fluid collection. Decompression of abscess following drain placement. Minimal output from the drain. IMPRESSION: CT-guided placement of a drainage catheter within the left lung abscess collection. Electronically Signed   By: Richarda Overlie M.D.   On: 02/10/2016 12:52    Labs:  CBC:  Recent Labs  02/07/16 0300 02/08/16 0607 02/09/16 0553 02/10/16 0420  WBC 4.3 6.1 7.3 6.5  HGB 8.4* 8.1* 7.6* 8.0*  HCT 27.2* 26.3* 24.8* 26.5*  PLT 552* 598* 587* 629*    COAGS:  Recent Labs  01/20/16 1628 02/08/16 0607  INR 1.02 1.05  APTT 34 38*    BMP:  Recent Labs  02/07/16 0300 02/08/16 0607 02/09/16 0553 02/10/16 0420  NA 139 138 142 144  K 3.9 3.1* 3.2* 3.7  CL 107 108 108 110  CO2 GLUCOSE 85 86 82 82  BUN <5* <5* <5* <5*  CALCIUM 8.3* 8.1* 8.4* 8.5*  CREATININE 0.62 0.61 0.53 0.40*  GFRNONAA >60 >60 >60 >60  GFRAA >60 >60 >60 >60    LIVER FUNCTION TESTS:  Recent Labs  02/07/16 0300  BILITOT 0.5  AST 16  ALT 11*  ALKPHOS 96  PROT 5.8*  ALBUMIN 1.9*    Assessment and Plan:  Left chest tube drain placed 2/28 Left lung abscess Will follow  Electronically Signed: Eman Morimoto A 02/11/2016, 9:51 AM   I spent a total of 15 Minutes at the the patient's bedside AND on the patient's hospital floor or unit, greater than 50% of which was counseling/coordinating care for left chest tube drain

## 2016-02-11 NOTE — Progress Notes (Addendum)
      [address]       [place] [address]             [phone]      3 Days Post-Op Procedure(s) (LRB): VIDEO BRONCHOSCOPY WITH ENDOBRONCHIAL LAVAGE (N/A)   Subjective:  No new complaints.  Pain at chest tube site, relieves with medication  Objective: Vital signs in last 24 hours: Temp:  [97.9 F (36.6 C)-98.2 F (36.8 C)] 98.2 F (36.8 C) (03/01 0631) Pulse Rate:  [73-87] 87 (03/01 0631) Cardiac Rhythm:  [-]  Resp:  [17-18] 17 (03/01 0631) BP: (115-120)/(64-66) 115/66 mmHg (03/01 0631) SpO2:  [97 %-98 %] 97 % (03/01 0631) Weight:  [114 lb 1.6 oz (51.755 kg)] 114 lb 1.6 oz (51.755 kg) (03/01 1110)  Intake/Output from previous day: 02/28 0701 - 03/01 0700 In: 370 [P.O.:360; I.V.:10] Out: 2200 [Urine:2200] Intake/Output this shift: Total I/O In: 360 [P.O.:360] Out: -   General appearance: alert, cooperative and no distress Heart: regular rate and rhythm Lungs: diminished breath sounds LLL Abdomen: soft, non-tender; bowel sounds normal; no masses,  no organomegaly Wound: clean and dry  Lab Results:  Recent Labs  02/09/16 0553 02/10/16 0420  WBC 7.3 6.5  HGB 7.6* 8.0*  HCT 24.8* 26.5*  PLT 587* 629*   BMET:  Recent Labs  02/09/16 0553 02/10/16 0420  NA 142 144  K 3.2* 3.7  CL 108 110  CO2 23 25  GLUCOSE 82 82  BUN <5* <5*  CREATININE 0.53 0.40*  CALCIUM 8.4* 8.5*    PT/INR: No results for input(s): LABPROT, INR in the last 72 hours. ABG    Component Value Date/Time   PHART 7.491* 02/08/2016 0400   HCO3 21.7 02/08/2016 0400   TCO2 22.6 02/08/2016 0400   ACIDBASEDEF 1.2 02/08/2016 0400   O2SAT 97.3 02/08/2016 0400   CBG (last 3)  No results for input(s): GLUCAP in the last 72 hours.  Assessment/Plan: S/P Procedure(s) (LRB): VIDEO BRONCHOSCOPY WITH ENDOBRONCHIAL LAVAGE (N/A)  1. Chest tube- minimal to no output since placement.... There is thick purulent material in chest tube tubing 2. Bronchial brushings remain  negative to date 3. CXR- shows well-positioned chest tube, tube is lying adjacent to parenchymal area 4. Dispo- care per primary   LOS: 5 days    BARRETT, ERIN 02/11/2016  I have seen and examined the patient and agree with the assessment and plan as outlined.  I completely disagree with the interpreting radiologist's report from today's chest x-ray. The previous CT did not show signs of empyema, but rather lung abscess.  Chest tube position on today's x-ray looks quite good - tube within middle of lung abscess cavity in vicinity of lingular segments of LUL.  No air leak and minimal output.  Will leave tube in place to suction for at least a week to shrink the abscess cavity.  Continue broad spectrum intravenous antibiotics.  Repeat CT early next week.  I spent in excess of 15 minutes during the conduct of this hospital encounter and >50% of this time involved direct face-to-face encounter with the patient for counseling and/or coordination of their care.    Purcell Nails, MD 02/11/2016 6:27 PM

## 2016-02-11 NOTE — Progress Notes (Signed)
Pharmacy Antibiotic Note  Summer Howard is a 45 y.o. female admitted on 02/06/2016 with pneumonia.  Pharmacy has been consulted for Vancomycin and Zosyn dosing for patient with CT of chest showing lung abscess/necrotizing pneumonia. She was tx here for a possible thoracotomy. She has been on vanc/zosyn at [facility] but only received one dose at [facility]. Surprisingly, her trough is still subtherapeutic despite current dose for her weight. Going to do q8 dosing instead.   Plan: -Vanc  IV x1 then change vanc to  IV q8. Goal trough 15-20 mcg/ml -Repeat vanc trough Friday -Continue Zosyn 3.375 gm IV q8h    Height:  (157.5 cm) Weight: 114 lb 1.6 oz (51.755 kg) IBW/kg (Calculated) : 50.1  Temp (24hrs), Avg:98.4 F (36.9 C), Min:97.9 F (36.6 C), Max:99 F (37.2 C)   Recent Labs Lab 02/06/16 0702 02/07/16 0300 02/07/16 1054 02/08/16 0607 02/09/16 0553 02/10/16 0420 02/11/16 1420  WBC 6.5 4.3  --  6.1 7.3 6.5  --   CREATININE 0.47 0.62  --  0.61 0.53 0.40*  --   VANCOTROUGH  --   --  <4*  --   --   --  9*    Estimated Creatinine Clearance: 72.5 mL/min (by C-G formula based on Cr of 0.4).    Allergies  Allergen Reactions  . Phenobarbital Rash  . Sulfa Antibiotics Rash    Antimicrobials this admission: Zosyn 2/23 >>  Vancomycin 2/23 >>   Microbiology results: 2/23 Sputum Cx pending 2/23 Blood Cx pending x 2  Thank you for allowing pharmacy to be a part of this patient's care.  Ulyses Southward, PharmD Pager: [phone] 02/11/2016 3:32 PM

## 2016-02-12 ENCOUNTER — Inpatient Hospital Stay (HOSPITAL_COMMUNITY): Payer: Medicaid Other

## 2016-02-12 LAB — CBC
HEMATOCRIT: 24.1 % — AB (ref 36.0–46.0)
HEMOGLOBIN: 7.7 g/dL — AB (ref 12.0–15.0)
MCH: 26.5 pg (ref 26.0–34.0)
MCHC: 32 g/dL (ref 30.0–36.0)
MCV: 82.8 fL (ref 78.0–100.0)
Platelets: 530 10*3/uL — ABNORMAL HIGH (ref 150–400)
RBC: 2.91 MIL/uL — ABNORMAL LOW (ref 3.87–5.11)
RDW: 14 % (ref 11.5–15.5)
WBC: 7.2 10*3/uL (ref 4.0–10.5)

## 2016-02-12 LAB — BASIC METABOLIC PANEL
ANION GAP: 7 (ref 5–15)
BUN: <5 mg/dL — ABNORMAL LOW (ref 6–20)
CALCIUM: 8.5 mg/dL — AB (ref 8.9–10.3)
CO2: 28 mmol/L (ref 22–32)
Chloride: 107 mmol/L (ref 101–111)
Creatinine, Ser: 0.52 mg/dL (ref 0.44–1.00)
GFR calc Af Amer: >60 mL/min (ref >60)
GFR calc non Af Amer: >60 mL/min (ref >60)
GLUCOSE: 85 mg/dL (ref 65–99)
Potassium: 2.9 mmol/L — ABNORMAL LOW (ref 3.5–5.1)
Sodium: 142 mmol/L (ref 135–145)

## 2016-02-12 LAB — WOUND CULTURE: CULTURE: NO GROWTH

## 2016-02-12 MED ORDER — POTASSIUM CHLORIDE CRYS ER 20 MEQ PO TBCR
40.0000 meq | EXTENDED_RELEASE_TABLET | Freq: Two times a day (BID) | ORAL | Status: AC
  Administered 2016-02-12 (×2): 40 meq via ORAL
  Filled 2016-02-12 (×2): qty 2

## 2016-02-12 NOTE — Progress Notes (Addendum)
      [address]       [place] [address]             [phone]      4 Days Post-Op Procedure(s) (LRB): VIDEO BRONCHOSCOPY WITH ENDOBRONCHIAL LAVAGE (N/A)   Subjective:  No new complaints.  Continued paid at chest tube site.  Objective: Vital signs in last 24 hours: Temp:  [98.4 F (36.9 C)-99 F (37.2 C)] 98.5 F (36.9 C) (03/02 0709) Pulse Rate:  [80-88] 82 (03/02 0709) Cardiac Rhythm:  [-]  Resp:  [17-18] 17 (03/02 0709) BP: (119-133)/(68-80) 119/68 mmHg (03/02 0709) SpO2:  [98 %-99 %] 98 % (03/02 0709) Weight:  [114 lb 1.6 oz (51.755 kg)] 114 lb 1.6 oz (51.755 kg) (03/01 1110)  Intake/Output from previous day: 03/01 0701 - 03/02 0700 In: 950 [P.O.:600; IV Piggyback:350] Out: 2505 [Urine:2500; Chest Tube:5] Intake/Output this shift: Total I/O In: -  Out: 750 [Urine:750]  General appearance: alert, cooperative and no distress Heart: regular rate and rhythm Lungs: clear to auscultation bilaterally Abdomen: soft, non-tender; bowel sounds normal; no masses,  no organomegaly Wound: clean and dry  Lab Results:  Recent Labs  02/10/16 0420 02/12/16 0620  WBC 6.5 7.2  HGB 8.0* 7.7*  HCT 26.5* 24.1*  PLT 629* 530*   BMET:  Recent Labs  02/10/16 0420 02/12/16 0620  NA 144 142  K 3.7 2.9*  CL 110 107  CO2 25 28  GLUCOSE 82 85  BUN <5* <5*  CREATININE 0.40* 0.52  CALCIUM 8.5* 8.5*    PT/INR: No results for input(s): LABPROT, INR in the last 72 hours. ABG    Component Value Date/Time   PHART 7.491* 02/08/2016 0400   HCO3 21.7 02/08/2016 0400   TCO2 22.6 02/08/2016 0400   ACIDBASEDEF 1.2 02/08/2016 0400   O2SAT 97.3 02/08/2016 0400   CBG (last 3)  No results for input(s): GLUCAP in the last 72 hours.  Assessment/Plan: S/P Procedure(s) (LRB): VIDEO BRONCHOSCOPY WITH ENDOBRONCHIAL LAVAGE (N/A)  1. Chest tube- no output yesterday, purulent material remains in chest tube tubing 2. Bronchial brushings remain negative 3. Dispo-  continue chest tube for now, care per primary   LOS: 6 days    BARRETT, ERIN 02/12/2016  I have seen and examined the patient and agree with the assessment and plan as outlined.  Continue chest tube to suction through the weekend regardless of output.  Repeat CT scan 1st of next week and consider chest tube removal at that time.  She should remain on broad spectrum IV antibiotics.   Purcell Nails, MD 02/12/2016 4:04 PM

## 2016-02-12 NOTE — Care Management Note (Signed)
Case Management Note  Patient Details  Name: Summer Howard MRN: [id number] Date of Birth: [birth year]  Subjective/Objective:                    Action/Plan:  UR updated  Expected Discharge Date:                  Expected Discharge Plan:  Home/Self Care  In-House Referral:     Discharge planning Services     Post Acute Care Choice:    Choice offered to:     DME Arranged:    DME Agency:     HH Arranged:    HH Agency:     Status of Service:  In process, will continue to follow  Medicare Important Message Given:    Date Medicare IM Given:    Medicare IM give by:    Date Additional Medicare IM Given:    Additional Medicare Important Message give by:     If discussed at Long Length of Stay Meetings, dates discussed:  02-12-16  Additional Comments:  Kingsley Plan, RN 02/12/2016, 9:57 AM

## 2016-02-12 NOTE — Progress Notes (Signed)
Referring Physician(s): Oaks,Timothy Dr Barry Dienes  Supervising Physician: Dr Grace Isaac  Chief Complaint: Left lung abscess Left lung chest tube drain placed 2/28 in IR   Subjective:  Up in chair today Some better Output in pleurvac chamber small amount   Allergies: Phenobarbital and Sulfa antibiotics  Medications:  Current facility-administered medications:  .  0.9 %  sodium chloride infusion, 250 mL, Intravenous, PRN, Therisa Doyne, MD .  0.9 %  sodium chloride infusion, 250 mL, Intravenous, PRN, Purcell Nails, MD .  acetaminophen (TYLENOL) tablet 650 mg, 650 mg, Oral, Q4H PRN, 650 mg at 02/10/16 2252 **OR** acetaminophen (TYLENOL) suppository 650 mg, 650 mg, Rectal, Q4H PRN, Purcell Nails, MD .  acidophilus (RISAQUAD) capsule 2 capsule, 2 capsule, Oral, Daily, Therisa Doyne, MD, 2 capsule at 02/12/16 0949 .  enoxaparin (LOVENOX) injection 30 mg, 30 mg, Subcutaneous, Q24H, Elease Etienne, MD, 30 mg at 02/11/16 2242 .  feeding supplement (BOOST / RESOURCE BREEZE) liquid 1 Container, 1 Container, Oral, TID BM, Elease Etienne, MD, 1 Container at 02/12/16 [phone] .  ferrous fumarate-b12-vitamic C-folic acid (TRINSICON / FOLTRIN) capsule 1 capsule, 1 capsule, Oral, TID PC, Elease Etienne, MD, 1 capsule at 02/12/16 0949 .  guaiFENesin (MUCINEX) 12 hr tablet 1,200 mg, 1,200 mg, Oral, BID, Purcell Nails, MD, 1,200 mg at 02/12/16 0949 .  HYDROmorphone (DILAUDID) injection 1 mg, 1 mg, Intravenous, Q3H PRN, Elease Etienne, MD, 1 mg at 02/12/16 1057 .  loperamide (IMODIUM) capsule 2 mg, 2 mg, Oral, PRN, Therisa Doyne, MD, 2 mg at 02/11/16 0312 .  ondansetron (ZOFRAN) tablet 4 mg, 4 mg, Oral, Q8H PRN, Elease Etienne, MD, 4 mg at 02/11/16 1508 .  oxyCODONE (Oxy IR/ROXICODONE) immediate release tablet 5-10 mg, 5-10 mg, Oral, Q4H PRN, Purcell Nails, MD, 10 mg at 02/12/16 0949 .  pantoprazole (PROTONIX) EC tablet 40 mg, 40 mg, Oral, Daily, Therisa Doyne, MD, 40 mg  at 02/12/16 0949 .  piperacillin-tazobactam (ZOSYN) IVPB 3.375 g, 3.375 g, Intravenous, Q8H, Nishant Dhungel, MD, 3.375 g at 02/12/16 0653 .  potassium chloride SA (K-DUR,KLOR-CON) CR tablet 40 mEq, 40 mEq, Oral, BID, Jerald Kief, MD, 40 mEq at 02/12/16 0949 .  saccharomyces boulardii (FLORASTOR) capsule 250 mg, 250 mg, Oral, BID, Therisa Doyne, MD, 250 mg at 02/12/16 0949 .  sodium chloride flush (NS) 0.9 % injection 10-40 mL, 10-40 mL, Intracatheter, PRN, Elease Etienne, MD, 10 mL at 02/11/16 8295 .  sodium chloride flush (NS) 0.9 % injection 3 mL, 3 mL, Intravenous, Q12H, Therisa Doyne, MD, 3 mL at 02/09/16 2200 .  sodium chloride flush (NS) 0.9 % injection 3 mL, 3 mL, Intravenous, PRN, Therisa Doyne, MD .  sodium chloride flush (NS) 0.9 % injection 3 mL, 3 mL, Intravenous, Q12H, Purcell Nails, MD, 3 mL at 02/09/16 2139 .  sodium chloride flush (NS) 0.9 % injection 3 mL, 3 mL, Intravenous, PRN, Purcell Nails, MD .  sucralfate (CARAFATE) 1 GM/10ML suspension 1 g, 1 g, Oral, TID WC, Therisa Doyne, MD, 1 g at 02/12/16 0748 .  vancomycin (VANCOCIN) IVPB 750 mg/150 ml premix, 750 mg, Intravenous, Q8H, Jerald Kief, MD, 750 mg at 02/12/16 0748 .  zolpidem (AMBIEN) tablet 5 mg, 5 mg, Oral, QHS PRN, Elson Areas, PA-C, 5 mg at 02/11/16 2247    Vital Signs: BP 119/68 mmHg  Pulse 82  Temp(Src) 98.5 F (36.9 C) (Oral)  Resp 17  Ht  (1.575 m)  Wt 114 lb 1.6 oz (51.755 kg)  BMI 20.86 kg/m2  SpO2 98%  Physical Exam  Constitutional: She is oriented to person, place, and time.  Cardiovascular: Normal rate, regular rhythm and normal heart sounds.   Pulmonary/Chest: Effort normal. No respiratory distress.  Neurological: She is alert and oriented to person, place, and time.  Skin: Skin is warm and dry.  Site of L chest tube placement is clean and dry Tender slightly No bleeding  Few wheezes on Left Thick dark purulent output in tubing    Nursing note and  vitals reviewed.    Imaging: Dg Chest Port 1 View  02/12/2016  CLINICAL DATA:  Empyema.  Chest tube. EXAM: PORTABLE CHEST 1 VIEW COMPARISON:  02/11/2016.  CT 02/10/2016, 02/03/2016. FINDINGS: Right PICC line and left chest tube in stable position. Left lung/pleural consolidation is stable with stable positioning of the left chest tube in this consolidation. No pneumothorax. Mediastinum and hilar structures are normal. Heart size stable. No acute bony abnormality . IMPRESSION: 1. Left chest tube in stable with stable location within the left lower chest consolidation. No interim change. 2. Right PICC line stable position . Electronically Signed   By: Maisie Fus  Register   On: 02/12/2016 07:58   Labs:  CBC:  Recent Labs  02/08/16 9147 02/09/16 0553 02/10/16 0420 02/12/16 0620  WBC 6.1 7.3 6.5 7.2  HGB 8.1* 7.6* 8.0* 7.7*  HCT 26.3* 24.8* 26.5* 24.1*  PLT 598* 587* 629* 530*    COAGS:  Recent Labs  01/20/16 1628 02/08/16 0607  INR 1.02 1.05  APTT 34 38*    BMP:  Recent Labs  02/08/16 0607 02/09/16 0553 02/10/16 0420 02/12/16 0620  NA 138 142 144 142  K 3.1* 3.2* 3.7 2.9*  CL 108 108 110 107  CO2 GLUCOSE 86 82 82 85  BUN <5* <5* <5* <5*  CALCIUM 8.1* 8.4* 8.5* 8.5*  CREATININE 0.61 0.53 0.40* 0.52  GFRNONAA >60 >60 >60 >60  GFRAA >60 >60 >60 >60    LIVER FUNCTION TESTS:  Recent Labs  02/07/16 0300  BILITOT 0.5  AST 16  ALT 11*  ALKPHOS 96  PROT 5.8*  ALBUMIN 1.9*    Assessment and Plan: Left chest tube drain placed 2/28 for lung abscess Cont to follow Plans noted per Dr. Cornelius Moras   Electronically Signed: Brayton El 02/12/2016, 11:21 AM   I spent a total of 15 Minutes at the the patient's bedside AND on the patient's hospital floor or unit, greater than 50% of which was counseling/coordinating care for left chest tube drain

## 2016-02-12 NOTE — Progress Notes (Signed)
Nutrition Follow-up  DOCUMENTATION CODES:   Severe malnutrition in context of acute illness/injury  INTERVENTION:   -Continue Boost Breeze po TID, each supplement provides 250 kcal and 9 grams of protein -Continue FOLTRIN multivtiamin 3 times daily (folic acid, Vit C, Vit B12, iron)  NUTRITION DIAGNOSIS:   Increased nutrient needs related to acute illness, chronic illness as evidenced by estimated needs.  Ongoing  GOAL:   Patient will meet greater than or equal to 90% of their needs  Progressing  MONITOR:   PO intake, Supplement acceptance, Labs, Weight trends, Skin, I & O's  REASON FOR ASSESSMENT:   Consult Assessment of nutrition requirement/status  ASSESSMENT:   45 yo Female with PMH of Anxiety; Depression; Thrombocytopenia (HCC); Gastric ulcer; and Iron deficiency anemia; admitted with necrotizing pneumonia with pleural effusion needing surgical intervention  S/P Procedure(s) (LRB) 02/08/16: VIDEO BRONCHOSCOPY WITH ENDOBRONCHIAL LAVAGE (N/A)  Chaplain visited pt on 02/09/16 and will follow for ongoing spiritual support.   Pt underwent rt chest tube placement with IR on 02/10/16. Noted bronchoscopy negative for brushings.   Case discussed with RN, who reports pt is consuming MVI and Boost Breeze supplements, however, often does not consume the entire supplement due to sweet taste.   Spoke with pt at bedside, who appeared to have a more positive outlook in comparison to earlier this admission. She reports that her intake has improved and estimates that she consumes 25-40% of meals on average. She reveals that pain from chest tube site is a limiting factor to PO intake, but often can tolerate PO's once this is resolved. Pt confirms that she is consuming Boost Breeze supplements, but also noted that she can tolerate them better when they are watered down or on ice.   Nutrition-Focused physical exam completed. Findings are mild to moderate fat depletion, mild to moderate  muscle depletion, and no edema.   Pt expressed concern over her declining nutritional status as well as eagerness to improve. Discussed importance of continuing supplements and MVI at discharge and where pt could purchase supplements as an outpatient. Also encouraged pt to consume her protein foods first.   Labs reviewed: K: 2.9 (on PO supplementation).   Diet Order:  Diet regular Room service appropriate?: Yes; Fluid consistency:: Thin  Skin:  Reviewed, no issues  Last BM:  02/05/16  Height:   Ht Readings from Last 1 Encounters:  02/06/16  (1.575 m)    Weight:   Wt Readings from Last 1 Encounters:  02/11/16 114 lb 1.6 oz (51.755 kg)    Ideal Body Weight:  50 kg  BMI:  Body mass index is 20.86 kg/(m^2).  Estimated Nutritional Needs:   Kcal:  1600-1800  Protein:  70-80 gm  Fluid:  1.6-1.8 L  EDUCATION NEEDS:   Education needs addressed  Draven Natter A. Mayford Knife, RD, LDN, CDE Pager: [phone] After hours Pager: [phone]

## 2016-02-12 NOTE — Progress Notes (Addendum)
[facility] PROGRESS NOTE  Summer Howard ZOX:[id number] DOB: 06/18/1971 DOA: 02/06/2016 PCP: Rafael Bihari, MD  HPI/Brief narrative 45 year old female patient with history of anxiety, depression, iron deficiency anemia, thrombocytopenia, transferred from [facility] for evaluation of lung abscess by cardiothoracic surgery at [facility] [address] Status post video bronchoscopy followed by CT-guided left chest tube placement.  Assessment/Plan: Community-acquired pneumonia complicated by lung abscess - Status post video bronchoscopy with endobronchial lavage 2/26: Cultures negative to date. Cytology from bronchial washings and brushings negative. - Patient is continued on broad-spectrum IV antibiotics and pulmonary toilet. Blood cultures 3 from 2/23: Negative to date - Status post CT-guided left chest tube placement by IR on 2/28. Management per TCTS. Minimal output thus far - Per CTS, plan to continue chest tube to suction this weekend with repeat CT early next week, consideration for chest tube removal at that time  Hemoptysis - Mild and likely secondary to lung abscess. Holding Lovenox and aspirin. Monitor closely. None reported since admission.   Iron deficiency anemia - Aspirin currently held. Monitor CBCs periodically and transfer if hemoglobin <7 g per DL. Stable.  Thrombocytosis  - ? Reactive. Fluctuating.  History of gastric ulcer - PPI.  Hypokalemia - Replaced  Severe Protein Calorie Malnutrition - Nutrition following  Code Status: Full Family Communication: Pt in room, father at bedside Disposition Plan: plan d/c when cleared by CTS, possible early next week   Consultants:  CT surgery  Procedures:  RUE PICC line 2/25. Site looks clean and dry.  Video bronchoscopy with endobronchial lavage 2/26  CT-guided left chest tube placed by IR on 2/28.  Antibiotics: Anti-infectives    Start     Dose/Rate Route Frequency Ordered Stop   02/12/16 0000  vancomycin (VANCOCIN) IVPB  750 mg/150 ml premix     750 mg 150 mL/hr over 60 Minutes Intravenous Every 8 hours 02/11/16 1532     02/11/16 1600  vancomycin (VANCOCIN) 500 mg in sodium chloride 0.9 % 100 mL IVPB     500 mg 100 mL/hr over 60 Minutes Intravenous NOW 02/11/16 1531 02/11/16 1658   02/11/16 1030  fluconazole (DIFLUCAN) tablet 150 mg     150 mg Oral  Once 02/11/16 1017 02/11/16 1111   02/08/16 0200  vancomycin (VANCOCIN) IVPB 1000 mg/200 mL premix  Status:  Discontinued     1,000 mg 200 mL/hr over 60 Minutes Intravenous Every 12 hours 02/07/16 1306 02/11/16 1531   02/07/16 1315  vancomycin (VANCOCIN) 500 mg in sodium chloride 0.9 % 100 mL IVPB     500 mg 100 mL/hr over 60 Minutes Intravenous  Once 02/07/16 1306 02/07/16 1506   02/06/16 2300  vancomycin (VANCOCIN) IVPB 750 mg/150 ml premix  Status:  Discontinued     750 mg 150 mL/hr over 60 Minutes Intravenous Every 12 hours 02/06/16 1915 02/07/16 1306   02/06/16 2300  piperacillin-tazobactam (ZOSYN) IVPB 3.375 g     3.375 g 12.5 mL/hr over 240 Minutes Intravenous Every 8 hours 02/06/16 1915        HPI/Subjective: Still complaining of mild soreness of chest wall  Objective: Filed Vitals:   02/11/16 1439 02/11/16 2240 02/12/16 0709 02/12/16 1320  BP: 133/80 121/80 119/68 116/69  Pulse: 88 80 82 65  Temp: 99 F (37.2 C) 98.4 F (36.9 C) 98.5 F (36.9 C) 98.2 F (36.8 C)  TempSrc: Oral Oral Oral Oral  Resp: Height:      Weight:      SpO2: 98%  99% 98% 100%    Intake/Output Summary (Last 24 hours) at 02/12/16 1701 Last data filed at 02/12/16 1451  Gross per 24 hour  Intake    960 ml  Output   3400 ml  Net  -2440 ml   Filed Weights   02/06/16 1734 02/11/16 1110  Weight: 54.432 kg (120 lb) 51.755 kg (114 lb 1.6 oz)    Exam:   General:  Awake, in nad, sitting in chair  Cardiovascular: regular, s1, s2  Respiratory: normal resp effort, no wheezing  Abdomen: soft, nondistended  Musculoskeletal: perfused,no clubbing,  no cyanosis  Data Reviewed: Basic Metabolic Panel:  Recent Labs Lab 02/07/16 0300 02/08/16 0607 02/09/16 0553 02/10/16 0420 02/12/16 0620  NA 139 138 142 144 142  K 3.9 3.1* 3.2* 3.7 2.9*  CL 107 108 108 110 107  CO2 23 23 23 25 28   GLUCOSE 85 86 82 82 85  BUN <5* <5* <5* <5* <5*  CREATININE 0.62 0.61 0.53 0.40* 0.52  CALCIUM 8.3* 8.1* 8.4* 8.5* 8.5*  MG 1.8  --   --   --   --   PHOS 3.6  --   --   --   --    Liver Function Tests:  Recent Labs Lab 02/07/16 0300  AST 16  ALT 11*  ALKPHOS 96  BILITOT 0.5  PROT 5.8*  ALBUMIN 1.9*   No results for input(s): LIPASE, AMYLASE in the last 168 hours. No results for input(s): AMMONIA in the last 168 hours. CBC:  Recent Labs Lab 02/07/16 0300 02/08/16 0607 02/09/16 0553 02/10/16 0420 02/12/16 0620  WBC 4.3 6.1 7.3 6.5 7.2  NEUTROABS 2.9  --   --   --   --   HGB 8.4* 8.1* 7.6* 8.0* 7.7*  HCT 27.2* 26.3* 24.8* 26.5* 24.1*  MCV 85.0 83.8 83.2 83.6 82.8  PLT 552* 598* 587* 629* 530*   Cardiac Enzymes: No results for input(s): CKTOTAL, CKMB, CKMBINDEX, TROPONINI in the last 168 hours. BNP (last 3 results) No results for input(s): BNP in the last 8760 hours.  ProBNP (last 3 results) No results for input(s): PROBNP in the last 8760 hours.  CBG: No results for input(s): GLUCAP in the last 168 hours.  Recent Results (from the past 240 hour(s))  Blood culture (single)     Status: None   Collection Time: 02/05/16  9:30 AM  Result Value Ref Range Status   Specimen Description BLOOD RIGHT ASSIST CONTROL  Final   Special Requests BOTTLES DRAWN AEROBIC AND ANAEROBIC 1CCAERO,1CCANA  Final   Culture NO GROWTH 5 DAYS  Final   Report Status 02/10/2016 FINAL  Final  Culture, blood (routine x 2)     Status: None   Collection Time: 02/05/16 10:27 AM  Result Value Ref Range Status   Specimen Description BLOOD RIGHT HAND  Final   Special Requests BOTTLES DRAWN AEROBIC AND ANAEROBIC 0.5ML  Final   Culture NO GROWTH 5 DAYS   Final   Report Status 02/10/2016 FINAL  Final  Culture, blood (routine x 2)     Status: None   Collection Time: 02/05/16 10:30 AM  Result Value Ref Range Status   Specimen Description BLOOD LEFT AC  Final   Special Requests BOTTLES DRAWN AEROBIC AND ANAEROBIC 0.5ML  Final   Culture NO GROWTH 5 DAYS  Final   Report Status 02/10/2016 FINAL  Final  C difficile quick scan w PCR reflex     Status: None   Collection Time: 02/06/16  6:00 AM  Result Value Ref Range Status   C Diff antigen NEGATIVE NEGATIVE Final   C Diff toxin NEGATIVE NEGATIVE Final   C Diff interpretation Negative for C. difficile  Final  MRSA PCR Screening     Status: None   Collection Time: 02/08/16  9:14 AM  Result Value Ref Range Status   MRSA by PCR NEGATIVE NEGATIVE Final    Comment:        The GeneXpert MRSA Assay (FDA approved for NASAL specimens only), is one component of a comprehensive MRSA colonization surveillance program. It is not intended to diagnose MRSA infection nor to guide or monitor treatment for MRSA infections.   Acid Fast Smear (AFB)     Status: None   Collection Time: 02/08/16 11:40 AM  Result Value Ref Range Status   AFB Specimen Processing Concentration  Final   Acid Fast Smear Negative  Final    Comment: (NOTE) Performed At: [facility] [address] [place], [place] [id number] Mila Homer MD MV:[id number]    Source (AFB) BRONCHIAL WASHINGS  Final    Comment: LEFT UPPER LUNG   Culture, respiratory (NON-Expectorated)     Status: None   Collection Time: 02/08/16 11:40 AM  Result Value Ref Range Status   Specimen Description BRONCHIAL WASHINGS LEFT UPPER LUNG  Final   Special Requests   Final    BRONCHIAL WASHING LINGULAR SEGMENT LEFT UPPER LOBE POF ZOSYN   Gram Stain   Final    MODERATE WBC PRESENT,BOTH PMN AND MONONUCLEAR RARE SQUAMOUS EPITHELIAL CELLS PRESENT NO ORGANISMS SEEN Performed at [facility]    Culture   Final    NO GROWTH 2  DAYS Performed at [facility]    Report Status 02/11/2016 FINAL  Final  Wound culture     Status: None   Collection Time: 02/09/16  6:01 AM  Result Value Ref Range Status   Specimen Description WOUND  Final   Special Requests   Final    PICC LINE SITE PATIENT ON FOLLOWING VANCOMYCIN,ZOSYN   Gram Stain   Final    RARE WBC PRESENT, PREDOMINANTLY PMN NO SQUAMOUS EPITHELIAL CELLS SEEN NO ORGANISMS SEEN Performed at [facility]    Culture   Final    NO GROWTH 2 DAYS Performed at [facility]    Report Status 02/12/2016 FINAL  Final  Body fluid culture     Status: None (Preliminary result)   Collection Time: 02/10/16 11:01 AM  Result Value Ref Range Status   Specimen Description FLUID LEFT LUNG  Final   Special Requests LEFT UPPER LOBE  Final   Gram Stain   Final    ABUNDANT WBC PRESENT,BOTH PMN AND MONONUCLEAR NO ORGANISMS SEEN    Culture NO GROWTH 2 DAYS  Final   Report Status PENDING  Incomplete     Studies: Dg Chest 2 View  02/11/2016  CLINICAL DATA:  Left lung abscess with small caliber urge caliber chest tube placement. EXAM: CHEST  2 VIEW COMPARISON:  CT scan of the chest and chest x-ray of February 28 and February 09, 2016. FINDINGS: The small caliber chest tube projects over the lower third of the left hemi thorax. There is pleural fluid lateral and inferior to this. There is no pneumothorax. There is no mediastinal shift. The right lung is clear. The heart and pulmonary vascularity are normal. The PICC line tip projects over the midportion of the SVC. The bony thorax is unremarkable. IMPRESSION: The small  caliber chest tube lies adjacent to the area of parenchymal abnormality in the left lower lung. Correlation with the functioning of the tube is needed. Overall there has not been significant change in the appearance of the empyema. Elsewhere the chest is unremarkable. Electronically Signed   By: David  Swaziland M.D.   On: 02/11/2016 08:00   Dg Chest  Port 1 View  02/12/2016  CLINICAL DATA:  Empyema.  Chest tube. EXAM: PORTABLE CHEST 1 VIEW COMPARISON:  02/11/2016.  CT 02/10/2016, 02/03/2016. FINDINGS: Right PICC line and left chest tube in stable position. Left lung/pleural consolidation is stable with stable positioning of the left chest tube in this consolidation. No pneumothorax. Mediastinum and hilar structures are normal. Heart size stable. No acute bony abnormality . IMPRESSION: 1. Left chest tube in stable with stable location within the left lower chest consolidation. No interim change. 2. Right PICC line stable position . Electronically Signed   By: Maisie Fus  Register   On: 02/12/2016 07:58    Scheduled Meds: . acidophilus  2 capsule Oral Daily  . enoxaparin (LOVENOX) injection  30 mg Subcutaneous Q24H  . feeding supplement  1 Container Oral TID BM  . ferrous fumarate-b12-vitamic C-folic acid  1 capsule Oral TID PC  . guaiFENesin  1,200 mg Oral BID  . pantoprazole  40 mg Oral Daily  . piperacillin-tazobactam (ZOSYN)  IV  3.375 g Intravenous Q8H  . potassium chloride  40 mEq Oral BID  . saccharomyces boulardii  250 mg Oral BID  . sodium chloride flush  3 mL Intravenous Q12H  . sodium chloride flush  3 mL Intravenous Q12H  . sucralfate  1 g Oral TID WC  . vancomycin  750 mg Intravenous Q8H   Continuous Infusions:   Principal Problem:   Lung abscess (HCC) Active Problems:   Absolute anemia   Chronic gastrojejunal ulcer   Malnutrition following gastrointestinal surgery   Primary thrombocytopenia (HCC)   Pneumonia   Pleuritic chest pain   Chest pain with low risk for cardiac etiology   Necrotizing pneumonia (HCC)   Pleural effusion   Iron deficiency anemia   Hemoptysis   CAP (community acquired pneumonia)   Protein-calorie malnutrition (HCC)    CHIU, STEPHEN K  [facility] Pager [phone]. If 7PM-7AM, please contact night-coverage at [email or web address], password [facility] 02/12/2016, 5:01 PM  LOS: 6 days

## 2016-02-13 DIAGNOSIS — K912 Postsurgical malabsorption, not elsewhere classified: Secondary | ICD-10-CM

## 2016-02-13 DIAGNOSIS — J851 Abscess of lung with pneumonia: Secondary | ICD-10-CM

## 2016-02-13 LAB — CBC
HEMATOCRIT: 24.9 % — AB (ref 36.0–46.0)
HEMOGLOBIN: 7.7 g/dL — AB (ref 12.0–15.0)
MCH: 26 pg (ref 26.0–34.0)
MCHC: 30.9 g/dL (ref 30.0–36.0)
MCV: 84.1 fL (ref 78.0–100.0)
Platelets: 570 10*3/uL — ABNORMAL HIGH (ref 150–400)
RBC: 2.96 MIL/uL — AB (ref 3.87–5.11)
RDW: 14.4 % (ref 11.5–15.5)
WBC: 6.8 10*3/uL (ref 4.0–10.5)

## 2016-02-13 LAB — BASIC METABOLIC PANEL
ANION GAP: 10 (ref 5–15)
BUN: <5 mg/dL — ABNORMAL LOW (ref 6–20)
CALCIUM: 8.7 mg/dL — AB (ref 8.9–10.3)
CHLORIDE: 109 mmol/L (ref 101–111)
CO2: 26 mmol/L (ref 22–32)
Creatinine, Ser: 0.88 mg/dL (ref 0.44–1.00)
GFR calc Af Amer: >60 mL/min (ref >60)
GFR calc non Af Amer: >60 mL/min (ref >60)
GLUCOSE: 80 mg/dL (ref 65–99)
Potassium: 3.5 mmol/L (ref 3.5–5.1)
Sodium: 145 mmol/L (ref 135–145)

## 2016-02-13 LAB — VANCOMYCIN, TROUGH: VANCOMYCIN TR: 19 ug/mL (ref 10.0–20.0)

## 2016-02-13 NOTE — Progress Notes (Signed)
Referring Physician(s): Dr Horald Chestnut  Supervising Physician: Oley Balm  Chief Complaint:  Left lung abscess Chest tube drain placed 2/27  Subjective:  Up in chair No real change Output minimal  Allergies: Phenobarbital and Sulfa antibiotics  Medications: Prior to Admission medications   Medication Sig Start Date End Date Taking? Authorizing Provider  acetaminophen (TYLENOL) 500 MG tablet Take 1,000 mg by mouth every 6 (six) hours as needed for mild pain, fever or headache.   Yes Historical Provider, MD  acidophilus (RISAQUAD) CAPS capsule Take 2 capsules by mouth daily. 02/06/16  Yes Enedina Finner, MD  aspirin EC 81 MG tablet Take 1 tablet (81 mg total) by mouth daily. 01/23/16  Yes Shaune Pollack, MD  calcium carbonate (TUMS - DOSED IN MG ELEMENTAL CALCIUM) 500 MG chewable tablet Chew 2 tablets by mouth as needed for indigestion or heartburn.   Yes Historical Provider, MD  Multiple Vitamin (MULTIVITAMIN WITH MINERALS) TABS tablet Take 1 tablet by mouth daily.   Yes Historical Provider, MD  ondansetron (ZOFRAN) 4 MG tablet Take 4 mg by mouth every 8 (eight) hours as needed for nausea or vomiting.    Yes Historical Provider, MD  pantoprazole (PROTONIX) 40 MG tablet Take 40 mg by mouth daily.    Yes Historical Provider, MD  sucralfate (CARAFATE) 1 GM/10ML suspension Take 1 g by mouth 3 (three) times daily with meals.   Yes Historical Provider, MD  piperacillin-tazobactam (ZOSYN) 3.375 GM/50ML IVPB Inject 50 mLs (3.375 g total) into the vein every 8 (eight) hours. 02/06/16   Enedina Finner, MD  Vancomycin (VANCOCIN) 750 MG/150ML SOLN Inject 150 mLs (750 mg total) into the vein every 12 (twelve) hours. 02/06/16   Enedina Finner, MD     Vital Signs: BP 123/84 mmHg  Pulse 82  Temp(Src) 98.5 F (36.9 C) (Oral)  Resp 18  Ht  (1.575 m)  Wt 114 lb 1.6 oz (51.755 kg)  BMI 20.86 kg/m2  SpO2 100%  Physical Exam  Skin: Skin is warm and dry.  Scant amount of output in Pleurvac  chamber ++ air leak Site clean and dry NT No bleeding  Nursing note and vitals reviewed. Cx NG  Imaging: Dg Chest 2 View  02/11/2016  CLINICAL DATA:  Left lung abscess with small caliber urge caliber chest tube placement. EXAM: CHEST  2 VIEW COMPARISON:  CT scan of the chest and chest x-ray of February 28 and February 09, 2016. FINDINGS: The small caliber chest tube projects over the lower third of the left hemi thorax. There is pleural fluid lateral and inferior to this. There is no pneumothorax. There is no mediastinal shift. The right lung is clear. The heart and pulmonary vascularity are normal. The PICC line tip projects over the midportion of the SVC. The bony thorax is unremarkable. IMPRESSION: The small caliber chest tube lies adjacent to the area of parenchymal abnormality in the left lower lung. Correlation with the functioning of the tube is needed. Overall there has not been significant change in the appearance of the empyema. Elsewhere the chest is unremarkable. Electronically Signed   By: David  [place]and M.D.   On: 02/11/2016 08:00   Dg Chest Port 1 View  02/12/2016  CLINICAL DATA:  Empyema.  Chest tube. EXAM: PORTABLE CHEST 1 VIEW COMPARISON:  02/11/2016.  CT 02/10/2016, 02/03/2016. FINDINGS: Right PICC line and left chest tube in stable position. Left lung/pleural consolidation is stable with stable positioning of the left chest tube in this consolidation. No  pneumothorax. Mediastinum and hilar structures are normal. Heart size stable. No acute bony abnormality . IMPRESSION: 1. Left chest tube in stable with stable location within the left lower chest consolidation. No interim change. 2. Right PICC line stable position . Electronically Signed   By: Maisie Fushomas  Register   On: 02/12/2016 07:58   Ct Image Guided Fluid Drain By Catheter  02/10/2016  INDICATION: 45 year old with pneumonia and left lung abscess. Request for percutaneous drainage of this abscess collection. EXAM: CT-GUIDED PLACEMENT  OF DRAINAGE CATHETER IN THE LEFT LUNG ABSCESS MEDICATIONS: The patient is currently admitted to the hospital and receiving intravenous antibiotics. The antibiotics were administered within an appropriate time frame prior to the initiation of the procedure. ANESTHESIA/SEDATION: Fentanyl 150 mcg IV; Versed 4.0 mg IV Moderate Sedation Time:  45minutes The patient was continuously monitored during the procedure by the interventional radiology nurse under my direct supervision. COMPLICATIONS: None immediate. PROCEDURE: Informed written consent was obtained from the patient after a thorough discussion of the procedural risks, benefits and alternatives. All questions were addressed. Maximal Sterile Barrier Technique was utilized including caps, mask, sterile gowns, sterile gloves, sterile drape, hand hygiene and skin antiseptic. A timeout was performed prior to the initiation of the procedure. Patient was placed on the CT scanner and the left side of the chest was elevated. Images through the chest were obtained. The gas-filled collection in the left upper lobe was targeted. The left side of the chest was prepped with chlorhexidine. Sterile field was created. Skin was anesthetized with 1% lidocaine. A Yueh catheter was directed into the pleural space with CT guidance. Small amount air was aspirated. A stiff Amplatz wire was advanced through the Yueh catheter. The tract was dilated to accommodate a 14 French pigtail catheter. The catheter was advanced over the wire without complication. Small amount of additional air was aspirated. Initially, no fluid was aspirated. Eventually, a small amount of bloody fluid was draining from the catheter. Initially, the catheter was attached to a suction bulb and then switched to a Pleur-Evac chest tube drain. Catheter was sutured to the skin. FINDINGS: Large complex air-fluid structure in the left upper lobe compatible with an abscess. There is surrounding consolidation. Volume loss and  consolidation in left lower lobe. Catheter was placed within the air-fluid collection. Decompression of abscess following drain placement. Minimal output from the drain. IMPRESSION: CT-guided placement of a drainage catheter within the left lung abscess collection. Electronically Signed   By: Richarda OverlieAdam  Henn M.D.   On: 02/10/2016 12:52    Labs:  CBC:  Recent Labs  02/09/16 0553 02/10/16 0420 02/12/16 0620 02/13/16 0535  WBC 7.3 6.5 7.2 6.8  HGB 7.6* 8.0* 7.7* 7.7*  HCT 24.8* 26.5* 24.1* 24.9*  PLT 587* 629* 530* 570*    COAGS:  Recent Labs  01/20/16 1628 02/08/16 0607  INR 1.02 1.05  APTT 34 38*    BMP:  Recent Labs  02/09/16 0553 02/10/16 0420 02/12/16 0620 02/13/16 0535  NA 142 144 142 145  K 3.2* 3.7 2.9* 3.5  CL 108 110 107 109  CO2 23 25 28 26   GLUCOSE 82 82 85 80  BUN <5* <5* <5* <5*  CALCIUM 8.4* 8.5* 8.5* 8.7*  CREATININE 0.53 0.40* 0.52 0.88  GFRNONAA >60 >60 >60 >60  GFRAA >60 >60 >60 >60    LIVER FUNCTION TESTS:  Recent Labs  02/07/16 0300  BILITOT 0.5  AST 16  ALT 11*  ALKPHOS 96  PROT 5.8*  ALBUMIN 1.9*  Assessment and Plan:  Left lung abscess Chest tube drain placed 2/27 No significant output plan per Dr Cornelius Moras  Electronically Signed: Ralene Muskrat A 02/13/2016, 2:17 PM   I spent a total of 15 Minutes at the the patient's bedside AND on the patient's hospital floor or unit, greater than 50% of which was counseling/coordinating care for L chest tube drain

## 2016-02-13 NOTE — Progress Notes (Signed)
refilled 

## 2016-02-13 NOTE — Progress Notes (Addendum)
      [address]       [place] [id number]             [phone]      5 Days Post-Op Procedure(s) (LRB): VIDEO BRONCHOSCOPY WITH ENDOBRONCHIAL LAVAGE (N/A)   Subjective:  No new complaints.   Objective: Vital signs in last 24 hours: Temp:  [98.1 F (36.7 C)-98.6 F (37 C)] 98.1 F (36.7 C) (03/03 0434) Pulse Rate:  [65-88] 67 (03/03 0434) Cardiac Rhythm:  [-]  Resp:  [17-18] 17 (03/03 0434) BP: (116-119)/(69-75) 119/75 mmHg (03/03 0434) SpO2:  [100 %] 100 % (03/03 0434)  Intake/Output from previous day: 03/02 0701 - 03/03 0700 In: 1180 [P.O.:1180] Out: 3400 [Urine:3400] Intake/Output this shift: Total I/O In: 340 [P.O.:340] Out: 1000 [Urine:1000]  General appearance: alert, cooperative and no distress Heart: regular rate and rhythm Lungs: diminished breath sounds bibasilar Wound: clean and dry  Lab Results:  Recent Labs  02/12/16 0620 02/13/16 0535  WBC 7.2 6.8  HGB 7.7* 7.7*  HCT 24.1* 24.9*  PLT 530* 570*   BMET:  Recent Labs  02/12/16 0620 02/13/16 0535  NA 142 145  K 2.9* 3.5  CL 107 109  CO2 28 26  GLUCOSE 85 80  BUN <5* <5*  CREATININE 0.52 0.88  CALCIUM 8.5* 8.7*    PT/INR: No results for input(s): LABPROT, INR in the last 72 hours. ABG    Component Value Date/Time   PHART 7.491* 02/08/2016 0400   HCO3 21.7 02/08/2016 0400   TCO2 22.6 02/08/2016 0400   ACIDBASEDEF 1.2 02/08/2016 0400   O2SAT 97.3 02/08/2016 0400   CBG (last 3)  No results for input(s): GLUCAP in the last 72 hours.  Assessment/Plan: S/P Procedure(s) (LRB): VIDEO BRONCHOSCOPY WITH ENDOBRONCHIAL LAVAGE (N/A)  1. Chest tube- continues to be no output, continue tube for now 2. ID- Lung abscess on ABX 3. Care per primary   LOS: 7 days    BARRETT, ERIN 02/13/2016  I have seen and examined the patient and agree with the assessment and plan as outlined.  For f/u CT scan on Monday.  I spent in excess of 15 minutes during the conduct of this  hospital encounter and >50% of this time involved direct face-to-face encounter with the patient for counseling and/or coordination of their care.   Purcell Nailslarence H Lois Slagel, MD 02/13/2016 3:00 PM

## 2016-02-13 NOTE — Progress Notes (Signed)
[facility] PROGRESS NOTE  Summer Howard ZOX:[id number] DOB: September 08, 1971 DOA: 02/06/2016 PCP: Rafael Bihari, MD  HPI/Brief narrative 45 year old female patient with history of anxiety, depression, iron deficiency anemia, thrombocytopenia, transferred from [facility] for evaluation of lung abscess by cardiothoracic surgery at [facility]. Status post video bronchoscopy followed by CT-guided left chest tube placement.  Assessment/Plan: Community-acquired pneumonia complicated by lung abscess - Status post video bronchoscopy with endobronchial lavage 2/26: Cultures negative to date. Cytology from bronchial washings and brushings negative. - Patient is continued on broad-spectrum IV antibiotics and pulmonary toilet. Blood cultures 3 from 2/23: Negative to date - Status post CT-guided left chest tube placement by IR on 2/28. Management per TCTS. Patient continues with minimal output thus far - Per CTS, plan to continue chest tube to suction this weekend with repeat CT Monday, consideration for chest tube removal at that time  Hemoptysis - Mild and likely secondary to lung abscess. Holding Lovenox and aspirin. Monitor closely. None reported since admission.   Iron deficiency anemia - Aspirin currently held. Monitor CBCs periodically and transfer if hemoglobin <7 g per DL. Stable.  Thrombocytosis  - Likely reactive. Monitor  History of gastric ulcer - PPI.  Hypokalemia - Replaced  Severe Protein Calorie Malnutrition - Nutrition following  Code Status: Full Family Communication: Pt in room Disposition Plan: plan d/c when cleared by CTS, possible early next week   Consultants:  CT surgery  Procedures:  RUE PICC line 2/25. Site looks clean and dry.  Video bronchoscopy with endobronchial lavage 2/26  CT-guided left chest tube placed by IR on 2/28.  Antibiotics: Anti-infectives    Start     Dose/Rate Route Frequency Ordered Stop   02/12/16 0000  vancomycin (VANCOCIN) IVPB 750  mg/150 ml premix     750 mg 150 mL/hr over 60 Minutes Intravenous Every 8 hours 02/11/16 1532     02/11/16 1600  vancomycin (VANCOCIN) 500 mg in sodium chloride 0.9 % 100 mL IVPB     500 mg 100 mL/hr over 60 Minutes Intravenous NOW 02/11/16 1531 02/11/16 1658   02/11/16 1030  fluconazole (DIFLUCAN) tablet 150 mg     150 mg Oral  Once 02/11/16 1017 02/11/16 1111   02/08/16 0200  vancomycin (VANCOCIN) IVPB 1000 mg/200 mL premix  Status:  Discontinued     1,000 mg 200 mL/hr over 60 Minutes Intravenous Every 12 hours 02/07/16 1306 02/11/16 1531   02/07/16 1315  vancomycin (VANCOCIN) 500 mg in sodium chloride 0.9 % 100 mL IVPB     500 mg 100 mL/hr over 60 Minutes Intravenous  Once 02/07/16 1306 02/07/16 1506   02/06/16 2300  vancomycin (VANCOCIN) IVPB 750 mg/150 ml premix  Status:  Discontinued     750 mg 150 mL/hr over 60 Minutes Intravenous Every 12 hours 02/06/16 1915 02/07/16 1306   02/06/16 2300  piperacillin-tazobactam (ZOSYN) IVPB 3.375 g     3.375 g 12.5 mL/hr over 240 Minutes Intravenous Every 8 hours 02/06/16 1915        HPI/Subjective: Noting chest wall soreness. Otherwise also mentioning issues of "family drama" as well. Objective: Filed Vitals:   02/12/16 1320 02/12/16 2100 02/13/16 0434 02/13/16 1321  BP: 116/69 119/70 119/75 123/84  Pulse: 65 88 67 82  Temp: 98.2 F (36.8 C) 98.6 F (37 C) 98.1 F (36.7 C) 98.5 F (36.9 C)  TempSrc: Oral Oral Oral Oral  Resp: Height:      Weight:  SpO2: 100% 100% 100% 100%    Intake/Output Summary (Last 24 hours) at 02/13/16 1652 Last data filed at 02/13/16 1557  Gross per 24 hour  Intake   1657 ml  Output   3550 ml  Net  -1893 ml   Filed Weights   02/06/16 1734 02/11/16 1110  Weight: 54.432 kg (120 lb) 51.755 kg (114 lb 1.6 oz)    Exam:   General:  Awake, in nad, sitting in chair  Cardiovascular: regular, s1, s2  Respiratory: normal resp effort, no wheezing  Abdomen: soft, nondistended, pos  BS  Musculoskeletal: perfused,no clubbing, no cyanosis  Data Reviewed: Basic Metabolic Panel:  Recent Labs Lab 02/07/16 0300 02/08/16 0607 02/09/16 0553 02/10/16 0420 02/12/16 0620 02/13/16 0535  NA 139 138 142 144 142 145  K 3.9 3.1* 3.2* 3.7 2.9* 3.5  CL 107 108 108 110 107 109  CO2 23 23 23 25 28 26   GLUCOSE 85 86 82 82 85 80  BUN <5* <5* <5* <5* <5* <5*  CREATININE 0.62 0.61 0.53 0.40* 0.52 0.88  CALCIUM 8.3* 8.1* 8.4* 8.5* 8.5* 8.7*  MG 1.8  --   --   --   --   --   PHOS 3.6  --   --   --   --   --    Liver Function Tests:  Recent Labs Lab 02/07/16 0300  AST 16  ALT 11*  ALKPHOS 96  BILITOT 0.5  PROT 5.8*  ALBUMIN 1.9*   No results for input(s): LIPASE, AMYLASE in the last 168 hours. No results for input(s): AMMONIA in the last 168 hours. CBC:  Recent Labs Lab 02/07/16 0300 02/08/16 0607 02/09/16 0553 02/10/16 0420 02/12/16 0620 02/13/16 0535  WBC 4.3 6.1 7.3 6.5 7.2 6.8  NEUTROABS 2.9  --   --   --   --   --   HGB 8.4* 8.1* 7.6* 8.0* 7.7* 7.7*  HCT 27.2* 26.3* 24.8* 26.5* 24.1* 24.9*  MCV 85.0 83.8 83.2 83.6 82.8 84.1  PLT 552* 598* 587* 629* 530* 570*   Cardiac Enzymes: No results for input(s): CKTOTAL, CKMB, CKMBINDEX, TROPONINI in the last 168 hours. BNP (last 3 results) No results for input(s): BNP in the last 8760 hours.  ProBNP (last 3 results) No results for input(s): PROBNP in the last 8760 hours.  CBG: No results for input(s): GLUCAP in the last 168 hours.  Recent Results (from the past 240 hour(s))  Blood culture (single)     Status: None   Collection Time: 02/05/16  9:30 AM  Result Value Ref Range Status   Specimen Description BLOOD RIGHT ASSIST CONTROL  Final   Special Requests BOTTLES DRAWN AEROBIC AND ANAEROBIC 1CCAERO,1CCANA  Final   Culture NO GROWTH 5 DAYS  Final   Report Status 02/10/2016 FINAL  Final  Culture, blood (routine x 2)     Status: None   Collection Time: 02/05/16 10:27 AM  Result Value Ref Range Status    Specimen Description BLOOD RIGHT HAND  Final   Special Requests BOTTLES DRAWN AEROBIC AND ANAEROBIC 0.5ML  Final   Culture NO GROWTH 5 DAYS  Final   Report Status 02/10/2016 FINAL  Final  Culture, blood (routine x 2)     Status: None   Collection Time: 02/05/16 10:30 AM  Result Value Ref Range Status   Specimen Description BLOOD LEFT AC  Final   Special Requests BOTTLES DRAWN AEROBIC AND ANAEROBIC 0.5ML  Final   Culture NO GROWTH 5 DAYS  Final   Report Status 02/10/2016 FINAL  Final  C difficile quick scan w PCR reflex     Status: None   Collection Time: 02/06/16  6:00 AM  Result Value Ref Range Status   C Diff antigen NEGATIVE NEGATIVE Final   C Diff toxin NEGATIVE NEGATIVE Final   C Diff interpretation Negative for C. difficile  Final  MRSA PCR Screening     Status: None   Collection Time: 02/08/16  9:14 AM  Result Value Ref Range Status   MRSA by PCR NEGATIVE NEGATIVE Final    Comment:        The GeneXpert MRSA Assay (FDA approved for NASAL specimens only), is one component of a comprehensive MRSA colonization surveillance program. It is not intended to diagnose MRSA infection nor to guide or monitor treatment for MRSA infections.   Acid Fast Smear (AFB)     Status: None   Collection Time: 02/08/16 11:40 AM  Result Value Ref Range Status   AFB Specimen Processing Concentration  Final   Acid Fast Smear Negative  Final    Comment: (NOTE) Performed At: [facility] [address] [place], [place] [id number] Mila Homer MD WU:[id number]    Source (AFB) BRONCHIAL WASHINGS  Final    Comment: LEFT UPPER LUNG   Culture, respiratory (NON-Expectorated)     Status: None   Collection Time: 02/08/16 11:40 AM  Result Value Ref Range Status   Specimen Description BRONCHIAL WASHINGS LEFT UPPER LUNG  Final   Special Requests   Final    BRONCHIAL WASHING LINGULAR SEGMENT LEFT UPPER LOBE POF ZOSYN   Gram Stain   Final    MODERATE WBC PRESENT,BOTH PMN AND  MONONUCLEAR RARE SQUAMOUS EPITHELIAL CELLS PRESENT NO ORGANISMS SEEN Performed at [facility]    Culture   Final    NO GROWTH 2 DAYS Performed at [facility]    Report Status 02/11/2016 FINAL  Final  Wound culture     Status: None   Collection Time: 02/09/16  6:01 AM  Result Value Ref Range Status   Specimen Description WOUND  Final   Special Requests   Final    PICC LINE SITE PATIENT ON FOLLOWING VANCOMYCIN,ZOSYN   Gram Stain   Final    RARE WBC PRESENT, PREDOMINANTLY PMN NO SQUAMOUS EPITHELIAL CELLS SEEN NO ORGANISMS SEEN Performed at [facility]    Culture   Final    NO GROWTH 2 DAYS Performed at [facility]    Report Status 02/12/2016 FINAL  Final  Body fluid culture     Status: None (Preliminary result)   Collection Time: 02/10/16 11:01 AM  Result Value Ref Range Status   Specimen Description FLUID LEFT LUNG  Final   Special Requests LEFT UPPER LOBE  Final   Gram Stain   Final    ABUNDANT WBC PRESENT,BOTH PMN AND MONONUCLEAR NO ORGANISMS SEEN    Culture NO GROWTH 3 DAYS  Final   Report Status PENDING  Incomplete     Studies: Dg Chest Port 1 View  02/12/2016  CLINICAL DATA:  Empyema.  Chest tube. EXAM: PORTABLE CHEST 1 VIEW COMPARISON:  02/11/2016.  CT 02/10/2016, 02/03/2016. FINDINGS: Right PICC line and left chest tube in stable position. Left lung/pleural consolidation is stable with stable positioning of the left chest tube in this consolidation. No pneumothorax. Mediastinum and hilar structures are normal. Heart size stable. No acute bony abnormality . IMPRESSION: 1. Left chest tube in stable with stable  location within the left lower chest consolidation. No interim change. 2. Right PICC line stable position . Electronically Signed   By: Maisie Fus  Register   On: 02/12/2016 07:58    Scheduled Meds: . acidophilus  2 capsule Oral Daily  . enoxaparin (LOVENOX) injection  30 mg Subcutaneous Q24H  . feeding supplement  1 Container  Oral TID BM  . ferrous fumarate-b12-vitamic C-folic acid  1 capsule Oral TID PC  . guaiFENesin  1,200 mg Oral BID  . pantoprazole  40 mg Oral Daily  . piperacillin-tazobactam (ZOSYN)  IV  3.375 g Intravenous Q8H  . saccharomyces boulardii  250 mg Oral BID  . sodium chloride flush  3 mL Intravenous Q12H  . sodium chloride flush  3 mL Intravenous Q12H  . sucralfate  1 g Oral TID WC  . vancomycin  750 mg Intravenous Q8H   Continuous Infusions:   Principal Problem:   Lung abscess (HCC) Active Problems:   Absolute anemia   Chronic gastrojejunal ulcer   Malnutrition following gastrointestinal surgery   Primary thrombocytopenia (HCC)   Pneumonia   Pleuritic chest pain   Chest pain with low risk for cardiac etiology   Necrotizing pneumonia (HCC)   Pleural effusion   Iron deficiency anemia   Hemoptysis   CAP (community acquired pneumonia)   Protein-calorie malnutrition (HCC)    CHIU, STEPHEN K  [facility] Pager [phone]. If 7PM-7AM, please contact night-coverage at [email or web address], password [facility] 02/13/2016, 4:52 PM  LOS: 7 days

## 2016-02-13 NOTE — Progress Notes (Signed)
Pharmacy Antibiotic Note  Summer Howard is a 45 y.o. female admitted on 02/06/2016 with pneumonia.  Pharmacy has been consulted for Vancomycin and Zosyn dosing for patient with CT of chest showing lung abscess/necrotizing pneumonia. She was tx here for a possible thoracotomy. She has been on Vanc/Zosyn at [facility] but only received one dose at [facility]. Vancomycin trough is therapeutic at 19.  Plan: -Continue vancomycin 750mg  IV q8h. Goal trough 15-20 mcg/ml -Monitor UOP and renal function -Continue Zosyn 3.375 gm IV q8h -F/U culture data and LOT   Height: 5\' 2"  (157.5 cm) Weight: 114 lb 1.6 oz (51.755 kg) IBW/kg (Calculated) : 50.1  Temp (24hrs), Avg:98.3 F (36.8 C), Min:98.1 F (36.7 C), Max:98.6 F (37 C)   Recent Labs Lab 02/08/16 0607 02/09/16 0553 02/10/16 0420 02/11/16 1420 02/12/16 0620 02/13/16 0535 02/13/16 0845  WBC 6.1 7.3 6.5  --  7.2 6.8  --   CREATININE 0.61 0.53 0.40*  --  0.52 0.88  --   VANCOTROUGH  --   --   --  9*  --   --  19    Estimated Creatinine Clearance: 65.9 mL/min (by C-G formula based on Cr of 0.88).    Allergies  Allergen Reactions  . Phenobarbital Rash  . Sulfa Antibiotics Rash    Antimicrobials this admission: Zosyn 2/23 >>  Vancomycin 2/23 >>   Levels/Adjustments: 2/25 VT < 4 mcg/mL(collected early) on 750mg  q12 >> 1g q12  3/1 VT 9 on 1g q12 >> increased to 750 mg/8h  3/3 VT 19 - cont  Microbiology results: 2/23 BCx x2 - NG  2/26 BAL - NG  2/26 AFB -  2/27 wound - NG  2/28 L-lung fluid from CT >> ngtd  Thank you for allowing pharmacy to be a part of this patient's care.  PlainsJennifer Hosford, 1700 Rainbow BoulevardPharm.D., BCPS Clinical Pharmacist Pager: [phone] 02/13/2016 10:07 AM

## 2016-02-13 NOTE — Progress Notes (Signed)
   02/13/16 1600  Clinical Encounter Type  Visited With Patient;Health care provider  Visit Type Initial;Psychological support;Spiritual support;Social support  Referral From Nurse  Consult/Referral To Chaplain  Spiritual Encounters  Spiritual Needs Prayer;Emotional  Stress Factors  Patient Stress Factors Exhausted;Family relationships  Family Stress Factors Not reviewed  Advance Directives (For Healthcare)  Does patient have an advance directive? No   Chaplain stopped by to visit Pt. Chaplain provided emotional support via prayer and story-telling.

## 2016-02-13 NOTE — Progress Notes (Addendum)
PICC dressing changed again due to drainage. No leaking noted while flushing line with 20ml and site WNL. Discussed the possibility of a new PICC if drainage continues. RN notified. Will continue to monitor dressing and MD will be notified.   [facility] team Gordy LevanJennifer Newell Frater, RN

## 2016-02-14 LAB — BODY FLUID CULTURE: CULTURE: NO GROWTH

## 2016-02-14 NOTE — Progress Notes (Signed)
[facility] PROGRESS NOTE  Ferdinand Langoshley L Cress ZOX:[id number] DOB: 29-Nov-1971 DOA: 02/06/2016 PCP: Rafael BihariWALKER III, JOHN B, MD  HPI/Brief narrative 45 year old female patient with history of anxiety, depression, iron deficiency anemia, thrombocytopenia, [facility]. Status post video bronchoscopy followed by CT-guided left chest tube placement.  Assessment/Plan: Community-acquired pneumonia complicated by lung abscess - Status post video bronchoscopy with endobronchial lavage 2/26: Cultures negative to date. Cytology from bronchial washings and brushings negative. - Patient is continued on broad-spectrum IV antibiotics and pulmonary toilet. Blood cultures 3 from 2/23: Negative to date - Status post CT-guided left chest tube placement by IR on 2/28. Management per TCTS.  - Per CTS, plan to continue chest tube to suction this weekend with repeat CT Monday, consideration for chest tube removal at that time -Today still with minimal CT output  Hemoptysis - Mild and likely secondary to lung abscess. Holding Lovenox and aspirin. Monitor closely. None reported since admission.   Iron deficiency anemia - Aspirin currently held. Monitor CBCs periodically and transfer if hemoglobin <7 g per DL. Stable.  Thrombocytosis  - Likely reactive. Monitor  History of gastric ulcer - PPI.  Hypokalemia - Replaced  Severe Protein Calorie Malnutrition - Nutrition following  Code Status: Full Family Communication: Pt in room Disposition Plan: plan d/c when cleared by CTS, possible early next week   Consultants:  CT surgery  Procedures:  RUE PICC line 2/25. Site looks clean and dry.  Video bronchoscopy with endobronchial lavage 2/26  CT-guided left chest tube placed by IR on 2/28.  Antibiotics: Anti-infectives    Start     Dose/Rate Route Frequency Ordered Stop   02/12/16 0000  vancomycin (VANCOCIN) IVPB 750 mg/150 ml  premix     750 mg 150 mL/hr over 60 Minutes Intravenous Every 8 hours 02/11/16 1532     02/11/16 1600  vancomycin (VANCOCIN) 500 mg in sodium chloride 0.9 % 100 mL IVPB     500 mg 100 mL/hr over 60 Minutes Intravenous NOW 02/11/16 1531 02/11/16 1658   02/11/16 1030  fluconazole (DIFLUCAN) tablet 150 mg     150 mg Oral  Once 02/11/16 1017 02/11/16 1111   02/08/16 0200  vancomycin (VANCOCIN) IVPB 1000 mg/200 mL premix  Status:  Discontinued     1,000 mg 200 mL/hr over 60 Minutes Intravenous Every 12 hours 02/07/16 1306 02/11/16 1531   02/07/16 1315  vancomycin (VANCOCIN) 500 mg in sodium chloride 0.9 % 100 mL IVPB     500 mg 100 mL/hr over 60 Minutes Intravenous  Once 02/07/16 1306 02/07/16 1506   02/06/16 2300  vancomycin (VANCOCIN) IVPB 750 mg/150 ml premix  Status:  Discontinued     750 mg 150 mL/hr over 60 Minutes Intravenous Every 12 hours 02/06/16 1915 02/07/16 1306   02/06/16 2300  piperacillin-tazobactam (ZOSYN) IVPB 3.375 g     3.375 g 12.5 mL/hr over 240 Minutes Intravenous Every 8 hours 02/06/16 1915        HPI/Subjective: Patient without complaints today. Eager to go home, hopeful about not needing surgery  Objective: Filed Vitals:   02/13/16 1321 02/13/16 2055 02/14/16 0620 02/14/16 1437  BP: 123/84 101/62 114/59 123/82  Pulse: 82 63 64 79  Temp: 98.5 F (36.9 C) 98.1 F (36.7 C) 98.1 F (36.7 C) 98.1 F (36.7 C)  TempSrc: Oral Oral Oral Oral  Resp: 18 18 18 18   Height:      Weight:  SpO2: 100% 100% 99% 100%    Intake/Output Summary (Last 24 hours) at 02/14/16 1727 Last data filed at 02/14/16 1653  Gross per 24 hour  Intake   1410 ml  Output   1552 ml  Net   -142 ml   Filed Weights   02/06/16 1734 02/11/16 1110  Weight: 54.432 kg (120 lb) 51.755 kg (114 lb 1.6 oz)    Exam:   General:  Awake, in nad, sitting in chair  Cardiovascular: regular, s1, s2  Respiratory: normal resp effort, no wheezing, chest tube in place  Abdomen: soft,  nondistended, pos BS  Musculoskeletal: perfused,no clubbing, no cyanosis  Data Reviewed: Basic Metabolic Panel:  Recent Labs Lab 02/08/16 0607 02/09/16 0553 02/10/16 0420 02/12/16 0620 02/13/16 0535  NA 138 142 144 142 145  K 3.1* 3.2* 3.7 2.9* 3.5  CL 108 108 110 107 109  CO2 GLUCOSE 86 82 82 85 80  BUN <5* <5* <5* <5* <5*  CREATININE 0.61 0.53 0.40* 0.52 0.88  CALCIUM 8.1* 8.4* 8.5* 8.5* 8.7*   Liver Function Tests: No results for input(s): AST, ALT, ALKPHOS, BILITOT, PROT, ALBUMIN in the last 168 hours. No results for input(s): LIPASE, AMYLASE in the last 168 hours. No results for input(s): AMMONIA in the last 168 hours. CBC:  Recent Labs Lab 02/08/16 0607 02/09/16 0553 02/10/16 0420 02/12/16 0620 02/13/16 0535  WBC 6.1 7.3 6.5 7.2 6.8  HGB 8.1* 7.6* 8.0* 7.7* 7.7*  HCT 26.3* 24.8* 26.5* 24.1* 24.9*  MCV 83.8 83.2 83.6 82.8 84.1  PLT 598* 587* 629* 530* 570*   Cardiac Enzymes: No results for input(s): CKTOTAL, CKMB, CKMBINDEX, TROPONINI in the last 168 hours. BNP (last 3 results) No results for input(s): BNP in the last 8760 hours.  ProBNP (last 3 results) No results for input(s): PROBNP in the last 8760 hours.  CBG: No results for input(s): GLUCAP in the last 168 hours.  Recent Results (from the past 240 hour(s))  Blood culture (single)     Status: None   Collection Time: 02/05/16  9:30 AM  Result Value Ref Range Status   Specimen Description BLOOD RIGHT ASSIST CONTROL  Final   Special Requests BOTTLES DRAWN AEROBIC AND ANAEROBIC 1CCAERO,1CCANA  Final   Culture NO GROWTH 5 DAYS  Final   Report Status 02/10/2016 FINAL  Final  Culture, blood (routine x 2)     Status: None   Collection Time: 02/05/16 10:27 AM  Result Value Ref Range Status   Specimen Description BLOOD RIGHT HAND  Final   Special Requests BOTTLES DRAWN AEROBIC AND ANAEROBIC 0.5ML  Final   Culture NO GROWTH 5 DAYS  Final   Report Status 02/10/2016 FINAL  Final   Culture, blood (routine x 2)     Status: None   Collection Time: 02/05/16 10:30 AM  Result Value Ref Range Status   Specimen Description BLOOD LEFT AC  Final   Special Requests BOTTLES DRAWN AEROBIC AND ANAEROBIC 0.5ML  Final   Culture NO GROWTH 5 DAYS  Final   Report Status 02/10/2016 FINAL  Final  C difficile quick scan w PCR reflex     Status: None   Collection Time: 02/06/16  6:00 AM  Result Value Ref Range Status   C Diff antigen NEGATIVE NEGATIVE Final   C Diff toxin NEGATIVE NEGATIVE Final   C Diff interpretation Negative for C. difficile  Final  MRSA PCR Screening     Status: None  Collection Time: 02/08/16  9:14 AM  Result Value Ref Range Status   MRSA by PCR NEGATIVE NEGATIVE Final    Comment:        The GeneXpert MRSA Assay (FDA approved for NASAL specimens only), is one component of a comprehensive MRSA colonization surveillance program. It is not intended to diagnose MRSA infection nor to guide or monitor treatment for MRSA infections.   Acid Fast Smear (AFB)     Status: None   Collection Time: 02/08/16 11:40 AM  Result Value Ref Range Status   AFB Specimen Processing Concentration  Final   Acid Fast Smear Negative  Final    Comment: (NOTE) Performed At: [facility] [address] [place], [place] [id number] Mila Homer MD NW:[id number]    Source (AFB) BRONCHIAL WASHINGS  Final    Comment: LEFT UPPER LUNG   Culture, respiratory (NON-Expectorated)     Status: None   Collection Time: 02/08/16 11:40 AM  Result Value Ref Range Status   Specimen Description BRONCHIAL WASHINGS LEFT UPPER LUNG  Final   Special Requests   Final    BRONCHIAL WASHING LINGULAR SEGMENT LEFT UPPER LOBE POF ZOSYN   Gram Stain   Final    MODERATE WBC PRESENT,BOTH PMN AND MONONUCLEAR RARE SQUAMOUS EPITHELIAL CELLS PRESENT NO ORGANISMS SEEN Performed at [facility]    Culture   Final    NO GROWTH 2 DAYS Performed at [facility]    Report Status  02/11/2016 FINAL  Final  Wound culture     Status: None   Collection Time: 02/09/16  6:01 AM  Result Value Ref Range Status   Specimen Description WOUND  Final   Special Requests   Final    PICC LINE SITE PATIENT ON FOLLOWING VANCOMYCIN,ZOSYN   Gram Stain   Final    RARE WBC PRESENT, PREDOMINANTLY PMN NO SQUAMOUS EPITHELIAL CELLS SEEN NO ORGANISMS SEEN Performed at [facility]    Culture   Final    NO GROWTH 2 DAYS Performed at [facility]    Report Status 02/12/2016 FINAL  Final  Body fluid culture     Status: None   Collection Time: 02/10/16 11:01 AM  Result Value Ref Range Status   Specimen Description FLUID LEFT LUNG  Final   Special Requests LEFT UPPER LOBE  Final   Gram Stain   Final    ABUNDANT WBC PRESENT,BOTH PMN AND MONONUCLEAR NO ORGANISMS SEEN    Culture NO GROWTH 3 DAYS  Final   Report Status 02/14/2016 FINAL  Final     Studies: No results found.  Scheduled Meds: . acidophilus  2 capsule Oral Daily  . enoxaparin (LOVENOX) injection  30 mg Subcutaneous Q24H  . feeding supplement  1 Container Oral TID BM  . ferrous fumarate-b12-vitamic C-folic acid  1 capsule Oral TID PC  . guaiFENesin  1,200 mg Oral BID  . pantoprazole  40 mg Oral Daily  . piperacillin-tazobactam (ZOSYN)  IV  3.375 g Intravenous Q8H  . saccharomyces boulardii  250 mg Oral BID  . sodium chloride flush  3 mL Intravenous Q12H  . sodium chloride flush  3 mL Intravenous Q12H  . sucralfate  1 g Oral TID WC  . vancomycin  750 mg Intravenous Q8H   Continuous Infusions:   Principal Problem:   Lung abscess (HCC) Active Problems:   Absolute anemia   Chronic gastrojejunal ulcer   Malnutrition following gastrointestinal surgery   Primary thrombocytopenia (HCC)   Pneumonia  Pleuritic chest pain   Chest pain with low risk for cardiac etiology   Necrotizing pneumonia (HCC)   Pleural effusion   Iron deficiency anemia   Hemoptysis   CAP (community acquired pneumonia)    Protein-calorie malnutrition (HCC)    Kristal Perl K  [facility] Pager [phone]. If 7PM-7AM, please contact night-coverage at [email or web address], password [facility] 02/14/2016, 5:27 PM  LOS: 8 days

## 2016-02-15 DIAGNOSIS — E43 Unspecified severe protein-calorie malnutrition: Secondary | ICD-10-CM | POA: Diagnosis present

## 2016-02-15 DIAGNOSIS — J90 Pleural effusion, not elsewhere classified: Secondary | ICD-10-CM

## 2016-02-15 NOTE — Progress Notes (Signed)
[facility] PROGRESS NOTE  Ferdinand Langoshley L Kulinski ZOX:[id number] DOB: 08-13-1971 DOA: 02/06/2016 PCP: Rafael BihariWALKER III, JOHN B, MD  HPI/Brief narrative 45 year old female patient with history of anxiety, depression, iron deficiency anemia, thrombocytopenia, [facility] for [facility] of lung abscess by [facility]. Status post video bronchoscopy followed by CT-guided left chest tube placement.  Assessment/Plan: Community-acquired pneumonia complicated by lung abscess - Status post video bronchoscopy with endobronchial lavage 2/26: Cultures negative to date. Cytology from bronchial washings and brushings negative. - Patient is continued on broad-spectrum IV antibiotics and pulmonary toilet. Blood cultures 3 from 2/23: Negative to date - Status post CT-guided left chest tube placement by IR on 2/28. Management per TCTS.  - Per CTS, plan to continue chest tube to suction this weekend with repeat CT Monday, consideration for chest tube removal at that time -Remains with minimal CT output. Afebrile overnight  Hemoptysis - Mild and likely secondary to lung abscess. Holding Lovenox and aspirin. Monitor closely. None reported since admission.   Iron deficiency anemia - Aspirin currently held. Monitor CBCs periodically and transfer if hemoglobin <7 g per DL. Stable.  Thrombocytosis  - Likely reactive. Monitor  History of gastric ulcer - PPI.  Hypokalemia - Replaced  Severe Protein Calorie Malnutrition - Nutrition following  Code Status: Full Family Communication: Pt in room Disposition Plan: plan d/c when cleared by CTS, possible early next week   Consultants:  CT surgery  Procedures:  RUE PICC line 2/25. Site looks clean and dry.  Video bronchoscopy with endobronchial lavage 2/26  CT-guided left chest tube placed by IR on 2/28.  Antibiotics: Anti-infectives    Start     Dose/Rate Route Frequency Ordered Stop   02/12/16 0000  vancomycin (VANCOCIN)  IVPB 750 mg/150 ml premix     750 mg 150 mL/hr over 60 Minutes Intravenous Every 8 hours 02/11/16 1532     02/11/16 1600  vancomycin (VANCOCIN) 500 mg in sodium chloride 0.9 % 100 mL IVPB     500 mg 100 mL/hr over 60 Minutes Intravenous NOW 02/11/16 1531 02/11/16 1658   02/11/16 1030  fluconazole (DIFLUCAN) tablet 150 mg     150 mg Oral  Once 02/11/16 1017 02/11/16 1111   02/08/16 0200  vancomycin (VANCOCIN) IVPB 1000 mg/200 mL premix  Status:  Discontinued     1,000 mg 200 mL/hr over 60 Minutes Intravenous Every 12 hours 02/07/16 1306 02/11/16 1531   02/07/16 1315  vancomycin (VANCOCIN) 500 mg in sodium chloride 0.9 % 100 mL IVPB     500 mg 100 mL/hr over 60 Minutes Intravenous  Once 02/07/16 1306 02/07/16 1506   02/06/16 2300  vancomycin (VANCOCIN) IVPB 750 mg/150 ml premix  Status:  Discontinued     750 mg 150 mL/hr over 60 Minutes Intravenous Every 12 hours 02/06/16 1915 02/07/16 1306   02/06/16 2300  piperacillin-tazobactam (ZOSYN) IVPB 3.375 g     3.375 g 12.5 mL/hr over 240 Minutes Intravenous Every 8 hours 02/06/16 1915        HPI/Subjective: Seems to be in better spirits today. Denies sob.  Objective: Filed Vitals:   02/14/16 1437 02/14/16 2130 02/15/16 0640 02/15/16 1514  BP: 123/82 121/75 129/77 121/72  Pulse: 79 68 66 65  Temp: 98.1 F (36.7 C) 98.6 F (37 C) 99.1 F (37.3 C) 98.6 F (37 C)  TempSrc: Oral Oral Oral Oral  Resp: 18 17 18 18   Height:      Weight:      SpO2: 100%  100% 99% 100%    Intake/Output Summary (Last 24 hours) at 02/15/16 1543 Last data filed at 02/15/16 1310  Gross per 24 hour  Intake   1920 ml  Output   3050 ml  Net  -1130 ml   Filed Weights   02/06/16 1734 02/11/16 1110  Weight: 54.432 kg (120 lb) 51.755 kg (114 lb 1.6 oz)    Exam:   General:  Awake, in nad, sitting in chair  Cardiovascular: regular, s1, s2  Respiratory: normal resp effort, no wheezing, chest tube in place  Abdomen: soft, nondistended, pos  BS  Musculoskeletal: perfused,no cyanosis  Data Reviewed: Basic Metabolic Panel:  Recent Labs Lab 02/09/16 0553 02/10/16 0420 02/12/16 0620 02/13/16 0535  NA 142 144 142 145  K 3.2* 3.7 2.9* 3.5  CL 108 110 107 109  CO2 GLUCOSE 82 82 85 80  BUN <5* <5* <5* <5*  CREATININE 0.53 0.40* 0.52 0.88  CALCIUM 8.4* 8.5* 8.5* 8.7*   Liver Function Tests: No results for input(s): AST, ALT, ALKPHOS, BILITOT, PROT, ALBUMIN in the last 168 hours. No results for input(s): LIPASE, AMYLASE in the last 168 hours. No results for input(s): AMMONIA in the last 168 hours. CBC:  Recent Labs Lab 02/09/16 0553 02/10/16 0420 02/12/16 0620 02/13/16 0535  WBC 7.3 6.5 7.2 6.8  HGB 7.6* 8.0* 7.7* 7.7*  HCT 24.8* 26.5* 24.1* 24.9*  MCV 83.2 83.6 82.8 84.1  PLT 587* 629* 530* 570*   Cardiac Enzymes: No results for input(s): CKTOTAL, CKMB, CKMBINDEX, TROPONINI in the last 168 hours. BNP (last 3 results) No results for input(s): BNP in the last 8760 hours.  ProBNP (last 3 results) No results for input(s): PROBNP in the last 8760 hours.  CBG: No results for input(s): GLUCAP in the last 168 hours.  Recent Results (from the past 240 hour(s))  C difficile quick scan w PCR reflex     Status: None   Collection Time: 02/06/16  6:00 AM  Result Value Ref Range Status   C Diff antigen NEGATIVE NEGATIVE Final   C Diff toxin NEGATIVE NEGATIVE Final   C Diff interpretation Negative for C. difficile  Final  MRSA PCR Screening     Status: None   Collection Time: 02/08/16  9:14 AM  Result Value Ref Range Status   MRSA by PCR NEGATIVE NEGATIVE Final    Comment:        The GeneXpert MRSA Assay (FDA approved for NASAL specimens only), is one component of a comprehensive MRSA colonization surveillance program. It is not intended to diagnose MRSA infection nor to guide or monitor treatment for MRSA infections.   Acid Fast Smear (AFB)     Status: None   Collection Time: 02/08/16 11:40  AM  Result Value Ref Range Status   AFB Specimen Processing Concentration  Final   Acid Fast Smear Negative  Final    Comment: (NOTE) Performed At: [facility] [address] [place], [place] [id number] Mila Homer MD WU:[id number]    Source (AFB) BRONCHIAL WASHINGS  Final    Comment: LEFT UPPER LUNG   Culture, respiratory (NON-Expectorated)     Status: None   Collection Time: 02/08/16 11:40 AM  Result Value Ref Range Status   Specimen Description BRONCHIAL WASHINGS LEFT UPPER LUNG  Final   Special Requests   Final    BRONCHIAL WASHING LINGULAR SEGMENT LEFT UPPER LOBE POF ZOSYN   Gram Stain   Final    MODERATE  WBC PRESENT,BOTH PMN AND MONONUCLEAR RARE SQUAMOUS EPITHELIAL CELLS PRESENT NO ORGANISMS SEEN Performed at [facility]    Culture   Final    NO GROWTH 2 DAYS Performed at [facility]    Report Status 02/11/2016 FINAL  Final  Wound culture     Status: None   Collection Time: 02/09/16  6:01 AM  Result Value Ref Range Status   Specimen Description WOUND  Final   Special Requests   Final    PICC LINE SITE PATIENT ON FOLLOWING VANCOMYCIN,ZOSYN   Gram Stain   Final    RARE WBC PRESENT, PREDOMINANTLY PMN NO SQUAMOUS EPITHELIAL CELLS SEEN NO ORGANISMS SEEN Performed at [facility]    Culture   Final    NO GROWTH 2 DAYS Performed at [facility]    Report Status 02/12/2016 FINAL  Final  Body fluid culture     Status: None   Collection Time: 02/10/16 11:01 AM  Result Value Ref Range Status   Specimen Description FLUID LEFT LUNG  Final   Special Requests LEFT UPPER LOBE  Final   Gram Stain   Final    ABUNDANT WBC PRESENT,BOTH PMN AND MONONUCLEAR NO ORGANISMS SEEN    Culture NO GROWTH 3 DAYS  Final   Report Status 02/14/2016 FINAL  Final     Studies: No results found.  Scheduled Meds: . acidophilus  2 capsule Oral Daily  . enoxaparin (LOVENOX) injection  30 mg Subcutaneous Q24H  . feeding supplement  1  Container Oral TID BM  . ferrous fumarate-b12-vitamic C-folic acid  1 capsule Oral TID PC  . guaiFENesin  1,200 mg Oral BID  . pantoprazole  40 mg Oral Daily  . piperacillin-tazobactam (ZOSYN)  IV  3.375 g Intravenous Q8H  . saccharomyces boulardii  250 mg Oral BID  . sodium chloride flush  3 mL Intravenous Q12H  . sodium chloride flush  3 mL Intravenous Q12H  . sucralfate  1 g Oral TID WC  . vancomycin  750 mg Intravenous Q8H   Continuous Infusions:   Principal Problem:   Lung abscess (HCC) Active Problems:   Absolute anemia   Chronic gastrojejunal ulcer   Malnutrition following gastrointestinal surgery   Primary thrombocytopenia (HCC)   Pneumonia   Pleuritic chest pain   Chest pain with low risk for cardiac etiology   Necrotizing pneumonia (HCC)   Pleural effusion   Iron deficiency anemia   Hemoptysis   CAP (community acquired pneumonia)   Protein-calorie malnutrition (HCC)   Protein-calorie malnutrition, severe    Tearra Ouk K  [facility] Pager [phone]. If 7PM-7AM, please contact night-coverage at [email or web address], password [facility] 02/15/2016, 3:43 PM  LOS: 9 days

## 2016-02-16 ENCOUNTER — Inpatient Hospital Stay (HOSPITAL_COMMUNITY): Payer: Medicaid Other

## 2016-02-16 DIAGNOSIS — J851 Abscess of lung with pneumonia: Secondary | ICD-10-CM

## 2016-02-16 LAB — CBC
HCT: 30.1 % — ABNORMAL LOW (ref 36.0–46.0)
Hemoglobin: 9.1 g/dL — ABNORMAL LOW (ref 12.0–15.0)
MCH: 25.6 pg — AB (ref 26.0–34.0)
MCHC: 30.2 g/dL (ref 30.0–36.0)
MCV: 84.6 fL (ref 78.0–100.0)
PLATELETS: 441 10*3/uL — AB (ref 150–400)
RBC: 3.56 MIL/uL — AB (ref 3.87–5.11)
RDW: 14.8 % (ref 11.5–15.5)
WBC: 7.1 10*3/uL (ref 4.0–10.5)

## 2016-02-16 LAB — BASIC METABOLIC PANEL
Anion gap: 10 (ref 5–15)
CHLORIDE: 107 mmol/L (ref 101–111)
CO2: 28 mmol/L (ref 22–32)
CREATININE: 0.9 mg/dL (ref 0.44–1.00)
Calcium: 8.6 mg/dL — ABNORMAL LOW (ref 8.9–10.3)
GFR calc Af Amer: >60 mL/min (ref >60)
GFR calc non Af Amer: >60 mL/min (ref >60)
GLUCOSE: 85 mg/dL (ref 65–99)
Potassium: 3.1 mmol/L — ABNORMAL LOW (ref 3.5–5.1)
Sodium: 145 mmol/L (ref 135–145)

## 2016-02-16 MED ORDER — IOHEXOL 300 MG/ML  SOLN
75.0000 mL | Freq: Once | INTRAMUSCULAR | Status: AC | PRN
  Administered 2016-02-16: 75 mL via INTRAVENOUS

## 2016-02-16 MED ORDER — ENOXAPARIN SODIUM 40 MG/0.4ML ~~LOC~~ SOLN
40.0000 mg | SUBCUTANEOUS | Status: DC
  Administered 2016-02-16 – 2016-02-18 (×3): 40 mg via SUBCUTANEOUS
  Filled 2016-02-16 (×3): qty 0.4

## 2016-02-16 NOTE — Clinical Social Work Note (Signed)
BSW intern was referred by nurse stating that patient wanted to speak with a Education officer, museum. BSW intern met with patient at bedside. Patient explained that her father had consulted a nurse about her being depressed. Patient stated that she was in pain sometimes and some days she was tired, but she was not depressed. Patient also expressed that she lost her spouse a little over a year ago and was receiving counseling from that. Patient was not aware of this exchange between her father and the nurse and felt as if she had not been communicated with appropriately. Patient hopes that communication with nurses would improve, her voice could be heard, and patient would be aware of what is going on with her care. BSW intern signing off.  Raynelle Highland BSW Intern, [phone]

## 2016-02-16 NOTE — Progress Notes (Signed)
      [address]       [place] [id number]             [phone]     CARDIOTHORACIC SURGERY PROGRESS NOTE  8 Days Post-Op  S/P Procedure(s) (LRB): VIDEO BRONCHOSCOPY WITH ENDOBRONCHIAL LAVAGE (N/A)  Subjective: No complaints  Objective: Vital signs in last 24 hours: Temp:  [97.8 F (36.6 C)-98.6 F (37 C)] 98.4 F (36.9 C) (03/06 1356) Pulse Rate:  [63-66] 63 (03/06 1356) Cardiac Rhythm:  [-]  Resp:  [18] 18 (03/06 1356) BP: (121-153)/(72-90) 135/89 mmHg (03/06 1356) SpO2:  [99 %-100 %] 100 % (03/06 1356)  Physical Exam:  Rhythm:   sinus  Breath sounds: Fairly clear  Heart sounds:  RRR  Incisions:  n/a  Abdomen:  soft  Extremities:  Warm  Chest tubes:  No significant output, no air leak    Intake/Output from previous day: 03/05 0701 - 03/06 0700 In: 600 [P.O.:600] Out: 2625 [Urine:2625] Intake/Output this shift: Total I/O In: 600 [P.O.:600] Out: 950 [Urine:950]  Lab Results:  Recent Labs  02/16/16 0745  WBC 7.1  HGB 9.1*  HCT 30.1*  PLT 441*   BMET:  Recent Labs  02/16/16 0745  NA 145  K 3.1*  CL 107  CO2 28  GLUCOSE 85  BUN <5*  CREATININE 0.90  CALCIUM 8.6*    CBG (last 3)  No results for input(s): GLUCAP in the last 72 hours. PT/INR:  No results for input(s): LABPROT, INR in the last 72 hours.  Assessment/Plan: S/P Procedure(s) (LRB): VIDEO BRONCHOSCOPY WITH ENDOBRONCHIAL LAVAGE (N/A)  Patient remains clinically stable.  CT chest looks very good w/ tube in good position and markedly improved appearance of abscess in lingular segments of left upper lobe.  Will plan to have IR remove chest tube tomorrow.  Can convert patient to oral Augmentin.  I would plan for total 6 week course of antibiotics.  Potentially ready for d/c home in 2-3 days.  I spent in excess of 15 minutes during the conduct of this hospital encounter and >50% of this time involved direct face-to-face encounter with the patient for counseling and/or  coordination of their care.   Purcell Nailslarence H Saxon Crosby, MD 02/16/2016 3:04 PM

## 2016-02-16 NOTE — Progress Notes (Signed)
Pharmacy Antibiotic Note  Summer Howard is a 45 y.o. female admitted on 02/06/2016 with pneumonia.  Pharmacy has been consulted for Vancomycin and Zosyn dosing for patient with CT of chest showing lung abscess/necrotizing pneumonia. She was tx here for a possible thoracotomy.   Now day #11 of abx for lung abscess. Afebrile, WBC wnl. VT not drawn this am before dose was given. Dose overnight also given 2 hrs late so will retry for level this afternoon.  Plan: Continue vancomycin 750 mg IV Q8  Check VT at 1530 today Continue Zosyn 3.375gm IV Q8H, 4 hr infusion Monitor clinical picture, renal function, VT prn F/U C&S, abx deescalation / LOT  MRSA PCR negative. Consider need to continue vancomycin for much longer   Height: 5\' 2"  (157.5 cm) Weight: 114 lb 1.6 oz (51.755 kg) IBW/kg (Calculated) : 50.1  Temp (24hrs), Avg:98.3 F (36.8 C), Min:97.8 F (36.6 C), Max:98.6 F (37 C)   Recent Labs Lab 02/10/16 0420 02/11/16 1420 02/12/16 0620 02/13/16 0535 02/13/16 0845 02/16/16 0745  WBC 6.5  --  7.2 6.8  --  7.1  CREATININE 0.40*  --  0.52 0.88  --  0.90  VANCOTROUGH  --  9*  --   --  19  --     Estimated Creatinine Clearance: 64.4 mL/min (by C-G formula based on Cr of 0.9).    Allergies  Allergen Reactions  . Phenobarbital Rash  . Sulfa Antibiotics Rash    Antimicrobials this admission: Zosyn 2/23 >>  Vancomycin 2/23 >>   Levels/Adjustments: 2/25 VT < 4 mcg/mL(collected early) on 750mg  q12 >> 1g q12  3/1 VT 9 on 1g q12 >> increased to 750 mg/8h  3/3 VT 19 - continued current rate  Microbiology results: 2/23 BCx x2 - NG  2/26 BAL - NG  2/26 AFB -  2/27 wound - NG  2/28 L-lung fluid from CT >> ngtd  Thank you for allowing pharmacy to be a part of this patient's care.  Enzo BiNathan Bryton Romagnoli, PharmD, BCPS Clinical Pharmacist Pager [phone] 02/16/2016 10:27 AM

## 2016-02-16 NOTE — Progress Notes (Signed)
[facility] PROGRESS NOTE  Summer Howard AOZ:[id number] DOB: 07/21/1971 DOA: 02/06/2016 PCP: Rafael Bihari, MD  HPI/Brief narrative 45 year old female patient with history of anxiety, depression, iron deficiency anemia, thrombocytopenia, transferred from [facility] for evaluation of lung abscess by cardiothoracic surgery at [facility]. Status post video bronchoscopy followed by CT-guided left chest tube placement.  Assessment/Plan: Community-acquired pneumonia complicated by lung abscess - Status post video bronchoscopy with endobronchial lavage 2/26: Cultures negative to date. Cytology from bronchial washings and brushings negative. - Patient is continued on broad-spectrum IV antibiotics and pulmonary toilet. Blood cultures 3 from 2/23: Negative to date - Status post CT-guided left chest tube placement by IR on 2/28. Management per TCTS.  - Patient has remained on broad spectrum abx over the weekend. - CT chest today demonstrates over 50% improvement in abscess. - CT Surgery recs for transition to PO augmentin with total of 6 weeks of treatment - Plan for IR removal of chest tube tomorrow  Hemoptysis - Mild and likely secondary to lung abscess. Holding Lovenox and aspirin. Monitor closely. None reported since admission.   Iron deficiency anemia - Aspirin currently held. Monitor CBCs periodically and transfer if hemoglobin <7 g per DL. Stable.  Thrombocytosis  - Likely reactive. Monitor  History of gastric ulcer - PPI.  Hypokalemia - Replaced  Severe Protein Calorie Malnutrition - Nutrition following  Code Status: Full Family Communication: Pt in room Disposition Plan: plan d/c when cleared by CTS, possible in 2-3 days   Consultants:  CT surgery  Procedures:  RUE PICC line 2/25. Site looks clean and dry.  Video bronchoscopy with endobronchial lavage 2/26  CT-guided left chest tube placed by IR on 2/28.  Antibiotics: Anti-infectives    Start     Dose/Rate Route  Frequency Ordered Stop   02/12/16 0000  vancomycin (VANCOCIN) IVPB 750 mg/150 ml premix     750 mg 150 mL/hr over 60 Minutes Intravenous Every 8 hours 02/11/16 1532     02/11/16 1600  vancomycin (VANCOCIN) 500 mg in sodium chloride 0.9 % 100 mL IVPB     500 mg 100 mL/hr over 60 Minutes Intravenous NOW 02/11/16 1531 02/11/16 1658   02/11/16 1030  fluconazole (DIFLUCAN) tablet 150 mg     150 mg Oral  Once 02/11/16 1017 02/11/16 1111   02/08/16 0200  vancomycin (VANCOCIN) IVPB 1000 mg/200 mL premix  Status:  Discontinued     1,000 mg 200 mL/hr over 60 Minutes Intravenous Every 12 hours 02/07/16 1306 02/11/16 1531   02/07/16 1315  vancomycin (VANCOCIN) 500 mg in sodium chloride 0.9 % 100 mL IVPB     500 mg 100 mL/hr over 60 Minutes Intravenous  Once 02/07/16 1306 02/07/16 1506   02/06/16 2300  vancomycin (VANCOCIN) IVPB 750 mg/150 ml premix  Status:  Discontinued     750 mg 150 mL/hr over 60 Minutes Intravenous Every 12 hours 02/06/16 1915 02/07/16 1306   02/06/16 2300  piperacillin-tazobactam (ZOSYN) IVPB 3.375 g     3.375 g 12.5 mL/hr over 240 Minutes Intravenous Every 8 hours 02/06/16 1915        HPI/Subjective: Eager to have CT chest done today  Objective: Filed Vitals:   02/15/16 1514 02/15/16 2227 02/16/16 0529 02/16/16 1356  BP: 121/72 131/90 153/77 135/89  Pulse: 65 66 64 63  Temp: 98.6 F (37 C) 97.8 F (36.6 C) 98.4 F (36.9 C) 98.4 F (36.9 C)  TempSrc: Oral Oral Oral Oral  Resp: Height:  Weight:      SpO2: 100% 99% 100% 100%    Intake/Output Summary (Last 24 hours) at 02/16/16 1701 Last data filed at 02/16/16 1443  Gross per 24 hour  Intake    720 ml  Output   2625 ml  Net  -1905 ml   Filed Weights   02/06/16 1734 02/11/16 1110  Weight: 54.432 kg (120 lb) 51.755 kg (114 lb 1.6 oz)    Exam:   General:  Awake, in nad, laying in bed  Cardiovascular: regular, s1, s2  Respiratory: normal resp effort, no wheezing, chest tube in  place  Abdomen: soft, nondistended, pos BS  Musculoskeletal: perfused,no cyanosis, no clubbing  Data Reviewed: Basic Metabolic Panel:  Recent Labs Lab 02/10/16 0420 02/12/16 0620 02/13/16 0535 02/16/16 0745  NA 144 142 145 145  K 3.7 2.9* 3.5 3.1*  CL 110 107 109 107  CO2 25 28 26 28   GLUCOSE 82 85 80 85  BUN <5* <5* <5* <5*  CREATININE 0.40* 0.52 0.88 0.90  CALCIUM 8.5* 8.5* 8.7* 8.6*   Liver Function Tests: No results for input(s): AST, ALT, ALKPHOS, BILITOT, PROT, ALBUMIN in the last 168 hours. No results for input(s): LIPASE, AMYLASE in the last 168 hours. No results for input(s): AMMONIA in the last 168 hours. CBC:  Recent Labs Lab 02/10/16 0420 02/12/16 0620 02/13/16 0535 02/16/16 0745  WBC 6.5 7.2 6.8 7.1  HGB 8.0* 7.7* 7.7* 9.1*  HCT 26.5* 24.1* 24.9* 30.1*  MCV 83.6 82.8 84.1 84.6  PLT 629* 530* 570* 441*   Cardiac Enzymes: No results for input(s): CKTOTAL, CKMB, CKMBINDEX, TROPONINI in the last 168 hours. BNP (last 3 results) No results for input(s): BNP in the last 8760 hours.  ProBNP (last 3 results) No results for input(s): PROBNP in the last 8760 hours.  CBG: No results for input(s): GLUCAP in the last 168 hours.  Recent Results (from the past 240 hour(s))  MRSA PCR Screening     Status: None   Collection Time: 02/08/16  9:14 AM  Result Value Ref Range Status   MRSA by PCR NEGATIVE NEGATIVE Final    Comment:        The GeneXpert MRSA Assay (FDA approved for NASAL specimens only), is one component of a comprehensive MRSA colonization surveillance program. It is not intended to diagnose MRSA infection nor to guide or monitor treatment for MRSA infections.   Acid Fast Smear (AFB)     Status: None   Collection Time: 02/08/16 11:40 AM  Result Value Ref Range Status   AFB Specimen Processing Concentration  Final   Acid Fast Smear Negative  Final    Comment: (NOTE) Performed At: [facility] [address] [place], [place]  [id number] Mila HomerHancock William F MD EX:[id number]:[phone]    Source (AFB) BRONCHIAL WASHINGS  Final    Comment: LEFT UPPER LUNG   Culture, respiratory (NON-Expectorated)     Status: None   Collection Time: 02/08/16 11:40 AM  Result Value Ref Range Status   Specimen Description BRONCHIAL WASHINGS LEFT UPPER LUNG  Final   Special Requests   Final    BRONCHIAL WASHING LINGULAR SEGMENT LEFT UPPER LOBE POF ZOSYN   Gram Stain   Final    MODERATE WBC PRESENT,BOTH PMN AND MONONUCLEAR RARE SQUAMOUS EPITHELIAL CELLS PRESENT NO ORGANISMS SEEN [facility]formed at [facility]    Culture   Final    NO GROWTH 2 DAYS Performed at [facility]    Report Status 02/11/2016 FINAL  Final  Wound culture     Status: None   Collection Time: 02/09/16  6:01 AM  Result Value Ref Range Status   Specimen Description WOUND  Final   Special Requests   Final    PICC LINE SITE PATIENT ON FOLLOWING VANCOMYCIN,ZOSYN   Gram Stain   Final    RARE WBC PRESENT, PREDOMINANTLY PMN NO SQUAMOUS EPITHELIAL CELLS SEEN NO ORGANISMS SEEN Performed at [facility]    Culture   Final    NO GROWTH 2 DAYS Performed at [facility]    Report Status 02/12/2016 FINAL  Final  Body fluid culture     Status: None   Collection Time: 02/10/16 11:01 AM  Result Value Ref Range Status   Specimen Description FLUID LEFT LUNG  Final   Special Requests LEFT UPPER LOBE  Final   Gram Stain   Final    ABUNDANT WBC PRESENT,BOTH PMN AND MONONUCLEAR NO ORGANISMS SEEN    Culture NO GROWTH 3 DAYS  Final   Report Status 02/14/2016 FINAL  Final     Studies: Ct Chest W Contrast  02/16/2016  CLINICAL DATA:  Followup left lung abscess EXAM: CT CHEST WITH CONTRAST TECHNIQUE: Multidetector CT imaging of the chest was performed during intravenous contrast administration. CONTRAST:  75 cc of Omnipaque 300 COMPARISON:  02/03/2016 FINDINGS: Mediastinum: Heart size is normal. No pericardial effusion identified. The trachea appears  patent and is midline. No enlarged mediastinal or hilar lymph nodes. No axillary or supraclavicular adenopathy. Lungs/Pleura: There is a small loculated hydropneumothorax overlying the posterior left lower lobe. Percutaneous pigtail drainage catheter within the left midlung is again identified. The catheter appears well positioned within the fluid collection. The left mid lung abscess measures 5.1 x 5.1 x 5.4 cm or ~ 70 cc, image number 30 of series 3 and image 55 of series 5. On the previous exam this measured 6.7 x 6.2 x 6.8 cm or ~141 cc. Atelectasis is identified within the left lower lobe. The central air coal component has resolved in the interval. Upper Abdomen: No acute findings identified. Musculoskeletal: No aggressive lytic or sclerotic bone lesions. Mild spondylosis identified within the thoracic spine. IMPRESSION: 1. The left midlung abscess has decreased in volume by approximately 50% when compared with study from 02/03/2016. There has been resolution of the central air component. The percutaneous drainage catheter remains well positioned within the abscess. 2. Persistent loculated left pleural effusion overlying the posterior left lower lobe. Electronically Signed   By: Signa Kell M.D.   On: 02/16/2016 16:20    Scheduled Meds: . acidophilus  2 capsule Oral Daily  . enoxaparin (LOVENOX) injection  40 mg Subcutaneous Q24H  . feeding supplement  1 Container Oral TID BM  . ferrous fumarate-b12-vitamic C-folic acid  1 capsule Oral TID PC  . guaiFENesin  1,200 mg Oral BID  . pantoprazole  40 mg Oral Daily  . piperacillin-tazobactam (ZOSYN)  IV  3.375 g Intravenous Q8H  . saccharomyces boulardii  250 mg Oral BID  . sodium chloride flush  3 mL Intravenous Q12H  . sodium chloride flush  3 mL Intravenous Q12H  . sucralfate  1 g Oral TID WC  . vancomycin  750 mg Intravenous Q8H   Continuous Infusions:   Principal Problem:   Lung abscess (HCC) Active Problems:   Absolute anemia    Chronic gastrojejunal ulcer   Malnutrition following gastrointestinal surgery   Primary thrombocytopenia (HCC)   Pneumonia   Pleuritic chest pain  Chest pain with low risk for cardiac etiology   Necrotizing pneumonia (HCC)   Pleural effusion   Iron deficiency anemia   Hemoptysis   CAP (community acquired pneumonia)   Protein-calorie malnutrition (HCC)   Protein-calorie malnutrition, severe    America Sandall K  [facility] Pager [phone]. If 7PM-7AM, please contact night-coverage at [email or web address], password [facility] 02/16/2016, 5:01 PM  LOS: 10 days

## 2016-02-16 NOTE — Progress Notes (Signed)
Patient ID: Summer Howard, female   DOB: 01-20-1971, 45 y.o.   MRN: [id number]    Referring Physician(s): Oaks,Timothy  Supervising Physician: Simonne Come  Chief Complaint:  Left lung abscess  Subjective:  Pt feeling about the same; still sore at left chest drain site; occ cough, and some dyspnea with exertion  Allergies: Phenobarbital and Sulfa antibiotics  Medications: Prior to Admission medications   Medication Sig Start Date End Date Taking? Authorizing Provider  acetaminophen (TYLENOL) 500 MG tablet Take 1,000 mg by mouth every 6 (six) hours as needed for mild pain, fever or headache.   Yes Historical Provider, MD  acidophilus (RISAQUAD) CAPS capsule Take 2 capsules by mouth daily. 02/06/16  Yes Enedina Finner, MD  aspirin EC 81 MG tablet Take 1 tablet (81 mg total) by mouth daily. 01/23/16  Yes Shaune Pollack, MD  calcium carbonate (TUMS - DOSED IN MG ELEMENTAL CALCIUM) 500 MG chewable tablet Chew 2 tablets by mouth as needed for indigestion or heartburn.   Yes Historical Provider, MD  Multiple Vitamin (MULTIVITAMIN WITH MINERALS) TABS tablet Take 1 tablet by mouth daily.   Yes Historical Provider, MD  ondansetron (ZOFRAN) 4 MG tablet Take 4 mg by mouth every 8 (eight) hours as needed for nausea or vomiting.    Yes Historical Provider, MD  pantoprazole (PROTONIX) 40 MG tablet Take 40 mg by mouth daily.    Yes Historical Provider, MD  sucralfate (CARAFATE) 1 GM/10ML suspension Take 1 g by mouth 3 (three) times daily with meals.   Yes Historical Provider, MD  piperacillin-tazobactam (ZOSYN) 3.375 GM/50ML IVPB Inject 50 mLs (3.375 g total) into the vein every 8 (eight) hours. 02/06/16   Enedina Finner, MD  Vancomycin (VANCOCIN) 750 MG/150ML SOLN Inject 150 mLs (750 mg total) into the vein every 12 (twelve) hours. 02/06/16   Enedina Finner, MD     Vital Signs: BP 153/77 mmHg  Pulse 64  Temp(Src) 98.4 F (36.9 C) (Oral)  Resp 18  Ht  (1.575 m)  Wt 114 lb 1.6 oz (51.755 kg)  BMI 20.86  kg/m2  SpO2 100%  Physical Exam left chest drain intact, dressing dry, mildly tender; output minimal amt brown fluid; cx's neg; no air leak  Imaging: No results found.  Labs:  CBC:  Recent Labs  02/10/16 0420 02/12/16 0620 02/13/16 0535 02/16/16 0745  WBC 6.5 7.2 6.8 7.1  HGB 8.0* 7.7* 7.7* 9.1*  HCT 26.5* 24.1* 24.9* 30.1*  PLT 629* 530* 570* 441*    COAGS:  Recent Labs  01/20/16 1628 02/08/16 0607  INR 1.02 1.05  APTT 34 38*    BMP:  Recent Labs  02/10/16 0420 02/12/16 0620 02/13/16 0535 02/16/16 0745  NA 144 142 145 145  K 3.7 2.9* 3.5 3.1*  CL 110 107 109 107  CO2 GLUCOSE 82 85 80 85  BUN <5* <5* <5* <5*  CALCIUM 8.5* 8.5* 8.7* 8.6*  CREATININE 0.40* 0.52 0.88 0.90  GFRNONAA >60 >60 >60 >60  GFRAA >60 >60 >60 >60    LIVER FUNCTION TESTS:  Recent Labs  02/07/16 0300  BILITOT 0.5  AST 16  ALT 11*  ALKPHOS 96  PROT 5.8*  ALBUMIN 1.9*    Assessment and Plan: S/p left chest drain 2/28 secondary to abscess/PNA; AF; WBC , creat nl; hgb 9.1, K 3.1- replace;  cx's neg; for f/u CT chest today   Electronically Signed: D. Jeananne Rama 02/16/2016, 9:13 AM   I spent a  total of 15 minutes at the the patient's bedside AND on the patient's hospital floor or unit, greater than 50% of which was counseling/coordinating care for left chest drain

## 2016-02-16 NOTE — Care Management Note (Signed)
Case Management Note  Patient Details  Name: Summer Howard MRN: [id number] Date of Birth: [birth year]  Subjective/Objective:                    Action/Plan:  UR updated  Expected Discharge Date:                  Expected Discharge Plan:  Home/Self Care  In-House Referral:     Discharge planning Services     Post Acute Care Choice:    Choice offered to:     DME Arranged:    DME Agency:     HH Arranged:    HH Agency:     Status of Service:  In process, will continue to follow  Medicare Important Message Given:    Date Medicare IM Given:    Medicare IM give by:    Date Additional Medicare IM Given:    Additional Medicare Important Message give by:     If discussed at Long Length of Stay Meetings, dates discussed:    Additional Comments:  [name], [name], RN 02/16/2016, 10:05 AM

## 2016-02-17 DIAGNOSIS — J851 Abscess of lung with pneumonia: Secondary | ICD-10-CM

## 2016-02-17 LAB — BASIC METABOLIC PANEL
Anion gap: 8 (ref 5–15)
CO2: 28 mmol/L (ref 22–32)
CREATININE: 0.98 mg/dL (ref 0.44–1.00)
Calcium: 8.5 mg/dL — ABNORMAL LOW (ref 8.9–10.3)
Chloride: 107 mmol/L (ref 101–111)
GFR calc Af Amer: >60 mL/min (ref >60)
Glucose, Bld: 85 mg/dL (ref 65–99)
Potassium: 2.9 mmol/L — ABNORMAL LOW (ref 3.5–5.1)
SODIUM: 143 mmol/L (ref 135–145)

## 2016-02-17 LAB — VANCOMYCIN, TROUGH: Vancomycin Tr: 36 ug/mL (ref 10.0–20.0)

## 2016-02-17 LAB — MAGNESIUM: MAGNESIUM: 2.1 mg/dL (ref 1.7–2.4)

## 2016-02-17 MED ORDER — POTASSIUM CHLORIDE CRYS ER 20 MEQ PO TBCR
40.0000 meq | EXTENDED_RELEASE_TABLET | Freq: Two times a day (BID) | ORAL | Status: AC
  Administered 2016-02-17 (×2): 40 meq via ORAL
  Filled 2016-02-17 (×2): qty 2

## 2016-02-17 NOTE — Care Management Note (Signed)
Case Management Note  Patient Details  Name: Summer Howard MRN: [id number] Date of Birth: 1971/03/21  Subjective/Objective:                    Action/Plan:   Expected Discharge Date:                  Expected Discharge Plan:  Home/Self Care  In-House Referral:     Discharge planning Services     Post Acute Care Choice:    Choice offered to:     DME Arranged:    DME Agency:     HH Arranged:    HH Agency:     Status of Service:  In process, will continue to follow  Medicare Important Message Given:    Date Medicare IM Given:    Medicare IM give by:    Date Additional Medicare IM Given:    Additional Medicare Important Message give by:     If discussed at Long Length of Stay Meetings, dates discussed:  02-17-16  Additional Comments: UR updated  [name], Timmey Lamba Marie, RN 02/17/2016, 10:58 AM

## 2016-02-17 NOTE — Progress Notes (Signed)
Pt a/o, pain meds given as ordered, pt spoke with Dr. Cornelius Moraswen regarding chest tube removal, pt now is agreeable, pts father would like to speak to Dr. Cornelius Moraswen regarding plan and concerns, VSS, pt stable

## 2016-02-17 NOTE — Progress Notes (Addendum)
      [address]       [place] [id number]             [phone]       9 Days Post-Op Procedure(s) (LRB): VIDEO BRONCHOSCOPY WITH ENDOBRONCHIAL LAVAGE (N/A)  Subjective: Patient states that tube is going to be removed, she will go home on oral antibiotic, and she is concerned that outcome will be another readmission.  Objective: Vital signs in last 24 hours: Temp:  [98.4 F (36.9 C)-98.9 F (37.2 C)] 98.4 F (36.9 C) (03/07 0548) Pulse Rate:  [61-66] 61 (03/07 0548) Cardiac Rhythm:  [-]  Resp:  [17-18] 17 (03/07 0548) BP: (122-140)/(75-89) 140/75 mmHg (03/07 0548) SpO2:  [100 %] 100 % (03/07 0548)      Intake/Output from previous day: 03/06 0701 - 03/07 0700 In: 2000 [P.O.:1800; IV Piggyback:200] Out: 3800 [Urine:3800]   Physical Exam:  Cardiovascular: RRR Pulmonary: Clear to auscultation on right and diminished left base; no rales, wheezes, or rhonchi. Wounds: Dressing is clean and dry.   Chest Tube: scant output  Lab Results: CBC: Recent Labs  02/16/16 0745  WBC 7.1  HGB 9.1*  HCT 30.1*  PLT 441*   BMET:  Recent Labs  02/16/16 0745 02/17/16 0750  NA 145 143  K 3.1* 2.9*  CL 107 107  CO2 28 28  GLUCOSE 85 85  BUN <5* <5*  CREATININE 0.90 0.98  CALCIUM 8.6* 8.5*    PT/INR: No results for input(s): LABPROT, INR in the last 72 hours. ABG:  INR: Will add last result for INR, ABG once components are confirmed Will add last 4 CBG results once components are confirmed  Assessment/Plan:  1. CV - SR in the 60's 2.  Pulmonary - Abscess lingular segment of LUL. Scant out put from tube last 48 hours. IR to remove tube today.  3. Will arrange for follow up appointment with Dr. Cornelius Moraswen. 4.ID-on Zosyn IV. Will transition to Augmentin for 6 weeks at discharge  ZIMMERMAN,DONIELLE MPA-C 02/17/2016,10:14 AM  I have seen and examined the patient and agree with the assessment and plan as outlined.  I have again discussed the results of  yesterday's CT scan with the patient and attempted to reassure her that everything suggests that our treatment plan is working beautifully.   Whereas there remains a small chance that she could have further trouble, I feel that at this point there is >90% likelihood that she will not require surgical intervention. I recommend removing the tube today and checking a f/u CXR tomorrow (which I have ordered).  If it looks okay then she may go home on oral Augmentin with a plan for 6 weeks total beginning when she was started on I.V. Zosyn.  I will plan to see her in the office in 3 weeks with a f/u CXR.  I spent in excess of 15 minutes during the conduct of this hospital encounter and >50% of this time involved direct face-to-face encounter with the patient for counseling and/or coordination of their care.  Purcell Nailslarence H Mar Zettler, MD 02/17/2016 1:37 PM

## 2016-02-17 NOTE — Progress Notes (Signed)
Nutrition Follow-up  DOCUMENTATION CODES:   Severe malnutrition in context of acute illness/injury  INTERVENTION:   -Continue Boost Breeze po TID, each supplement provides 250 kcal and 9 grams of protein -Continue FOLTRIN multivtiamin 3 times daily (folic acid, Vit C, Vit B12, iron)  NUTRITION DIAGNOSIS:   Increased nutrient needs related to acute illness, chronic illness as evidenced by estimated needs.  Ongoing  GOAL:   Patient will meet greater than or equal to 90% of their needs  Progressing  MONITOR:   PO intake, Supplement acceptance, Labs, Weight trends, Skin, I & O's  REASON FOR ASSESSMENT:   Consult Assessment of nutrition requirement/status  ASSESSMENT:   45 yo Female with PMH of Anxiety; Depression; Thrombocytopenia (HCC); Gastric ulcer; and Iron deficiency anemia; admitted with necrotizing pneumonia with pleural effusion needing surgical intervention  Pt resting at time of visit. RD did not wake.   Pt underwent chest tube removal earlier today. Per [facility] note, plan is to go home on oral antibiotics. Pt will undergo CXR on 02/18/16.   Pt remains on a regular diet. Meal completion remains variable; PO: 25-85%, however, suspect that intake is over-reported. Pt continues to consume her Boost Breeze and MVI supplements well. This RD discussed importance of continuing supplements after discharge and where to locate supplements in the outpatient setting.   Labs reviewed: K: 2.9 (on oral supplementation).   Diet Order:  Diet regular Room service appropriate?: Yes; Fluid consistency:: Thin  Skin:  Reviewed, no issues  Last BM:  02/16/16  Height:   Ht Readings from Last 1 Encounters:  02/06/16 5\' 2"  (1.575 m)    Weight:   Wt Readings from Last 1 Encounters:  02/11/16 114 lb 1.6 oz (51.755 kg)    Ideal Body Weight:  50 kg  BMI:  Body mass index is 20.86 kg/(m^2).  Estimated Nutritional Needs:   Kcal:  1600-1800  Protein:  70-80 gm  Fluid:  1.6-1.8  L  EDUCATION NEEDS:   Education needs addressed  Wymon Swaney A. Mayford KnifeWilliams, RD, LDN, CDE Pager: [phone] After hours Pager: [phone]

## 2016-02-17 NOTE — Progress Notes (Signed)
Referring Physician(s): Oaks,Timothy  Supervising Physician: Oley Balm  Chief Complaint: Left Lung abscess   Subjective:  Summer Howard is doing well today.  States chest tube can be removed. She is very happy about that.  Allergies: Phenobarbital and Sulfa antibiotics  Medications: Prior to Admission medications   Medication Sig Start Date End Date Taking? Authorizing Provider  acetaminophen (TYLENOL) 500 MG tablet Take 1,000 mg by mouth every 6 (six) hours as needed for mild pain, fever or headache.   Yes Historical Provider, MD  acidophilus (RISAQUAD) CAPS capsule Take 2 capsules by mouth daily. 02/06/16  Yes Enedina Finner, MD  aspirin EC 81 MG tablet Take 1 tablet (81 mg total) by mouth daily. 01/23/16  Yes Shaune Pollack, MD  calcium carbonate (TUMS - DOSED IN MG ELEMENTAL CALCIUM) 500 MG chewable tablet Chew 2 tablets by mouth as needed for indigestion or heartburn.   Yes Historical Provider, MD  Multiple Vitamin (MULTIVITAMIN WITH MINERALS) TABS tablet Take 1 tablet by mouth daily.   Yes Historical Provider, MD  ondansetron (ZOFRAN) 4 MG tablet Take 4 mg by mouth every 8 (eight) hours as needed for nausea or vomiting.    Yes Historical Provider, MD  pantoprazole (PROTONIX) 40 MG tablet Take 40 mg by mouth daily.    Yes Historical Provider, MD  sucralfate (CARAFATE) 1 GM/10ML suspension Take 1 g by mouth 3 (three) times daily with meals.   Yes Historical Provider, MD  piperacillin-tazobactam (ZOSYN) 3.375 GM/50ML IVPB Inject 50 mLs (3.375 g total) into the vein every 8 (eight) hours. 02/06/16   Enedina Finner, MD  Vancomycin (VANCOCIN) 750 MG/150ML SOLN Inject 150 mLs (750 mg total) into the vein every 12 (twelve) hours. 02/06/16   Enedina Finner, MD     Vital Signs: BP 140/75 mmHg  Pulse 61  Temp(Src) 98.4 F (36.9 C) (Oral)  Resp 17  Ht  (1.575 m)  Wt 114 lb 1.6 oz (51.755 kg)  BMI 20.86 kg/m2  SpO2 100%  Physical Exam  Awake and alert Chest tube in place on the left No  air leak Chest tube removed without difficulty and patient tolerated procedure very well.  Imaging: Ct Chest W Contrast  02/16/2016  CLINICAL DATA:  Followup left lung abscess EXAM: CT CHEST WITH CONTRAST TECHNIQUE: Multidetector CT imaging of the chest was performed during intravenous contrast administration. CONTRAST:  75 cc of Omnipaque 300 COMPARISON:  02/03/2016 FINDINGS: Mediastinum: Heart size is normal. No pericardial effusion identified. The trachea appears patent and is midline. No enlarged mediastinal or hilar lymph nodes. No axillary or supraclavicular adenopathy. Lungs/Pleura: There is a small loculated hydropneumothorax overlying the posterior left lower lobe. Percutaneous pigtail drainage catheter within the left midlung is again identified. The catheter appears well positioned within the fluid collection. The left mid lung abscess measures 5.1 x 5.1 x 5.4 cm or ~ 70 cc, image number 30 of series 3 and image 55 of series 5. On the previous exam this measured 6.7 x 6.2 x 6.8 cm or ~141 cc. Atelectasis is identified within the left lower lobe. The central air coal component has resolved in the interval. Upper Abdomen: No acute findings identified. Musculoskeletal: No aggressive lytic or sclerotic bone lesions. Mild spondylosis identified within the thoracic spine. IMPRESSION: 1. The left midlung abscess has decreased in volume by approximately 50% when compared with study from 02/03/2016. There has been resolution of the central air component. The percutaneous drainage catheter remains well positioned within the abscess. 2. Persistent loculated  left pleural effusion overlying the posterior left lower lobe. Electronically Signed   By: Signa Kellaylor  Stroud M.D.   On: 02/16/2016 16:20    Labs:  CBC:  Recent Labs  02/10/16 0420 02/12/16 0620 02/13/16 0535 02/16/16 0745  WBC 6.5 7.2 6.8 7.1  HGB 8.0* 7.7* 7.7* 9.1*  HCT 26.5* 24.1* 24.9* 30.1*  PLT 629* 530* 570* 441*    COAGS:  Recent  Labs  01/20/16 1628 02/08/16 0607  INR 1.02 1.05  APTT 34 38*    BMP:  Recent Labs  02/12/16 0620 02/13/16 0535 02/16/16 0745 02/17/16 0750  NA 142 145 145 143  K 2.9* 3.5 3.1* 2.9*  CL 107 109 107 107  CO2 28 26 28 28   GLUCOSE 85 80 85 85  BUN <5* <5* <5* <5*  CALCIUM 8.5* 8.7* 8.6* 8.5*  CREATININE 0.52 0.88 0.90 0.98  GFRNONAA >60 >60 >60 >60  GFRAA >60 >60 >60 >60    LIVER FUNCTION TESTS:  Recent Labs  02/07/16 0300  BILITOT 0.5  AST 16  ALT 11*  ALKPHOS 96  PROT 5.8*  ALBUMIN 1.9*    Assessment and Plan:  Left lung abscess  CT scan shows the left midlung abscess has decreased in volume by approximately 50% when compared with study from 02/03/2016. There has been resolution of the central air component. The percutaneous drainage catheter remains well positioned within the abscess. Persistent loculated left pleural effusion overlying the posterior left lower lobe.  Dr. Cornelius Moraswen recommended removal of chest tube.  Chest tube removed by me today without difficulty.  CXR ordered for tomorrow per Dr. Orvan Julywen's note.  Electronically Signed: Gwynneth MacleodWENDY S Artie Mcintyre PA-C 02/17/2016, 1:36 PM   I spent a total of 15 Minutes at the the patient's bedside AND on the patient's hospital floor or unit, greater than 50% of which was counseling/coordinating care for f/u of chest drain.

## 2016-02-17 NOTE — Progress Notes (Signed)
[facility] PROGRESS NOTE  Ferdinand Langoshley L Italiano ZOX:[id number] DOB: [birth year] DOA: 02/06/2016 PCP: Rafael BihariWALKER III, JOHN B, MD  HPI/Brief narrative 45 year old female patient with history of anxiety, depression, iron deficiency anemia, thrombocytopenia, [facility]. Status post video bronchoscopy followed by CT-guided left chest tube placement.  Assessment/Plan: Community-acquired pneumonia complicated by lung abscess - Status post video bronchoscopy with endobronchial lavage 2/26: Cultures negative to date. Cytology from bronchial washings and brushings negative. - Patient is continued on broad-spectrum IV antibiotics and pulmonary toilet. Blood cultures 3 from 2/23: Negative to date - Status post CT-guided left chest tube placement by IR on 2/28. Management per TCTS.  - Patient has remained on broad spectrum abx over the weekend. - CT chest today demonstrates over 50% improvement in abscess. - Chest tube removed today. Plan to transition to PO augmentin 3/8 and d/c home afterwards. Will need 6 weeks of antibiotics per CTS  Hemoptysis - Mild and likely secondary to lung abscess. Holding Lovenox and aspirin. Monitor closely. None reported since admission.   Iron deficiency anemia - Aspirin currently held. Monitor CBCs periodically and transfer if hemoglobin <7 g per DL. Remains stable.  Thrombocytosis  - Likely reactive. Monitor  History of gastric ulcer - PPI.  Hypokalemia - Replaced  Severe Protein Calorie Malnutrition - Nutrition following  Code Status: Full Family Communication: Pt in room Disposition Plan: plan d/c in 24-48hrs   Consultants:  CT surgery  Procedures:  RUE PICC line 2/25. Site looks clean and dry.  Video bronchoscopy with endobronchial lavage 2/26  CT-guided left chest tube placed by IR on 2/28.  Antibiotics: Anti-infectives    Start     Dose/Rate Route Frequency Ordered  Stop   02/12/16 0000  vancomycin (VANCOCIN) IVPB 750 mg/150 ml premix  Status:  Discontinued     750 mg 150 mL/hr over 60 Minutes Intravenous Every 8 hours 02/11/16 1532 02/17/16 0907   02/11/16 1600  vancomycin (VANCOCIN) 500 mg in sodium chloride 0.9 % 100 mL IVPB     500 mg 100 mL/hr over 60 Minutes Intravenous NOW 02/11/16 1531 02/11/16 1658   02/11/16 1030  fluconazole (DIFLUCAN) tablet 150 mg     150 mg Oral  Once 02/11/16 1017 02/11/16 1111   02/08/16 0200  vancomycin (VANCOCIN) IVPB 1000 mg/200 mL premix  Status:  Discontinued     1,000 mg 200 mL/hr over 60 Minutes Intravenous Every 12 hours 02/07/16 1306 02/11/16 1531   02/07/16 1315  vancomycin (VANCOCIN) 500 mg in sodium chloride 0.9 % 100 mL IVPB     500 mg 100 mL/hr over 60 Minutes Intravenous  Once 02/07/16 1306 02/07/16 1506   02/06/16 2300  vancomycin (VANCOCIN) IVPB 750 mg/150 ml premix  Status:  Discontinued     750 mg 150 mL/hr over 60 Minutes Intravenous Every 12 hours 02/06/16 1915 02/07/16 1306   02/06/16 2300  piperacillin-tazobactam (ZOSYN) IVPB 3.375 g     3.375 g 12.5 mL/hr over 240 Minutes Intravenous Every 8 hours 02/06/16 1915        HPI/Subjective: No sob. Seems to be in good spirits  Objective: Filed Vitals:   02/16/16 1356 02/16/16 2157 02/17/16 0548 02/17/16 1358  BP: 135/89 122/81 140/75 150/92  Pulse: 63 66 61 88  Temp: 98.4 F (36.9 C) 98.9 F (37.2 C) 98.4 F (36.9 C) 98.4 F (36.9 C)  TempSrc: Oral Oral Oral Oral  Resp: 18 17 17 17   Height:  Weight:      SpO2: 100% 100% 100% 100%    Intake/Output Summary (Last 24 hours) at 02/17/16 1711 Last data filed at 02/17/16 1613  Gross per 24 hour  Intake   1920 ml  Output   4450 ml  Net  -2530 ml   Filed Weights   02/06/16 1734 02/11/16 1110  Weight: 54.432 kg (120 lb) 51.755 kg (114 lb 1.6 oz)    Exam:   General:  Awake, in nad, laying in bed  Cardiovascular: regular, s1, s2  Respiratory: normal resp effort, no wheezing,  chest tube in place  Abdomen: soft, nondistended, pos BS  Musculoskeletal: perfused,no clubbing  Data Reviewed: Basic Metabolic Panel:  Recent Labs Lab 02/12/16 0620 02/13/16 0535 02/16/16 0745 02/17/16 0750  NA 142 145 145 143  K 2.9* 3.5 3.1* 2.9*  CL 107 109 107 107  CO2 28 26 28 28   GLUCOSE 85 80 85 85  BUN <5* <5* <5* <5*  CREATININE 0.52 0.88 0.90 0.98  CALCIUM 8.5* 8.7* 8.6* 8.5*  MG  --   --   --  2.1   Liver Function Tests: No results for input(s): AST, ALT, ALKPHOS, BILITOT, PROT, ALBUMIN in the last 168 hours. No results for input(s): LIPASE, AMYLASE in the last 168 hours. No results for input(s): AMMONIA in the last 168 hours. CBC:  Recent Labs Lab 02/12/16 0620 02/13/16 0535 02/16/16 0745  WBC 7.2 6.8 7.1  HGB 7.7* 7.7* 9.1*  HCT 24.1* 24.9* 30.1*  MCV 82.8 84.1 84.6  PLT 530* 570* 441*   Cardiac Enzymes: No results for input(s): CKTOTAL, CKMB, CKMBINDEX, TROPONINI in the last 168 hours. BNP (last 3 results) No results for input(s): BNP in the last 8760 hours.  ProBNP (last 3 results) No results for input(s): PROBNP in the last 8760 hours.  CBG: No results for input(s): GLUCAP in the last 168 hours.  Recent Results (from the past 240 hour(s))  MRSA PCR Screening     Status: None   Collection Time: 02/08/16  9:14 AM  Result Value Ref Range Status   MRSA by PCR NEGATIVE NEGATIVE Final    Comment:        The GeneXpert MRSA Assay (FDA approved for NASAL specimens only), is one component of a comprehensive MRSA colonization surveillance program. It is not intended to diagnose MRSA infection nor to guide or monitor treatment for MRSA infections.   Acid Fast Smear (AFB)     Status: None   Collection Time: 02/08/16 11:40 AM  Result Value Ref Range Status   AFB Specimen Processing Concentration  Final   Acid Fast Smear Negative  Final    Comment: (NOTE) Performed At: [facility] [address] [place], [place] [id number] Mila Homer MD WU:[id number]    Source (AFB) BRONCHIAL WASHINGS  Final    Comment: LEFT UPPER LUNG   Culture, respiratory (NON-Expectorated)     Status: None   Collection Time: 02/08/16 11:40 AM  Result Value Ref Range Status   Specimen Description BRONCHIAL WASHINGS LEFT UPPER LUNG  Final   Special Requests   Final    BRONCHIAL WASHING LINGULAR SEGMENT LEFT UPPER LOBE POF ZOSYN   Gram Stain   Final    MODERATE WBC PRESENT,BOTH PMN AND MONONUCLEAR RARE SQUAMOUS EPITHELIAL CELLS PRESENT NO ORGANISMS SEEN Performed at [facility]    Culture   Final    NO GROWTH 2 DAYS Performed at [facility]    Report Status  02/11/2016 FINAL  Final  Wound culture     Status: None   Collection Time: 02/09/16  6:01 AM  Result Value Ref Range Status   Specimen Description WOUND  Final   Special Requests   Final    PICC LINE SITE PATIENT ON FOLLOWING VANCOMYCIN,ZOSYN   Gram Stain   Final    RARE WBC PRESENT, PREDOMINANTLY PMN NO SQUAMOUS EPITHELIAL CELLS SEEN NO ORGANISMS SEEN Performed at [facility]    Culture   Final    NO GROWTH 2 DAYS Performed at [facility]    Report Status 02/12/2016 FINAL  Final  Body fluid culture     Status: None   Collection Time: 02/10/16 11:01 AM  Result Value Ref Range Status   Specimen Description FLUID LEFT LUNG  Final   Special Requests LEFT UPPER LOBE  Final   Gram Stain   Final    ABUNDANT WBC PRESENT,BOTH PMN AND MONONUCLEAR NO ORGANISMS SEEN    Culture NO GROWTH 3 DAYS  Final   Report Status 02/14/2016 FINAL  Final     Studies: Ct Chest W Contrast  02/16/2016  CLINICAL DATA:  Followup left lung abscess EXAM: CT CHEST WITH CONTRAST TECHNIQUE: Multidetector CT imaging of the chest was performed during intravenous contrast administration. CONTRAST:  75 cc of Omnipaque 300 COMPARISON:  02/03/2016 FINDINGS: Mediastinum: Heart size is normal. No pericardial effusion identified. The trachea appears patent and is  midline. No enlarged mediastinal or hilar lymph nodes. No axillary or supraclavicular adenopathy. Lungs/Pleura: There is a small loculated hydropneumothorax overlying the posterior left lower lobe. Percutaneous pigtail drainage catheter within the left midlung is again identified. The catheter appears well positioned within the fluid collection. The left mid lung abscess measures 5.1 x 5.1 x 5.4 cm or ~ 70 cc, image number 30 of series 3 and image 55 of series 5. On the previous exam this measured 6.7 x 6.2 x 6.8 cm or ~141 cc. Atelectasis is identified within the left lower lobe. The central air coal component has resolved in the interval. Upper Abdomen: No acute findings identified. Musculoskeletal: No aggressive lytic or sclerotic bone lesions. Mild spondylosis identified within the thoracic spine. IMPRESSION: 1. The left midlung abscess has decreased in volume by approximately 50% when compared with study from 02/03/2016. There has been resolution of the central air component. The percutaneous drainage catheter remains well positioned within the abscess. 2. Persistent loculated left pleural effusion overlying the posterior left lower lobe. Electronically Signed   By: Signa Kell M.D.   On: 02/16/2016 16:20    Scheduled Meds: . acidophilus  2 capsule Oral Daily  . enoxaparin (LOVENOX) injection  40 mg Subcutaneous Q24H  . feeding supplement  1 Container Oral TID BM  . ferrous fumarate-b12-vitamic C-folic acid  1 capsule Oral TID PC  . guaiFENesin  1,200 mg Oral BID  . pantoprazole  40 mg Oral Daily  . piperacillin-tazobactam (ZOSYN)  IV  3.375 g Intravenous Q8H  . potassium chloride  40 mEq Oral BID  . saccharomyces boulardii  250 mg Oral BID  . sodium chloride flush  3 mL Intravenous Q12H  . sodium chloride flush  3 mL Intravenous Q12H  . sucralfate  1 g Oral TID WC   Continuous Infusions:   Principal Problem:   Lung abscess (HCC) Active Problems:   Absolute anemia   Chronic  gastrojejunal ulcer   Malnutrition following gastrointestinal surgery   Primary thrombocytopenia (HCC)   Pneumonia  Pleuritic chest pain   Chest pain with low risk for cardiac etiology   Necrotizing pneumonia (HCC)   Pleural effusion   Iron deficiency anemia   Hemoptysis   CAP (community acquired pneumonia)   Protein-calorie malnutrition (HCC)   Protein-calorie malnutrition, severe    CHIU, STEPHEN K  [facility] Pager [phone]. If 7PM-7AM, please contact night-coverage at [email or web address], password [facility] 02/17/2016, 5:11 PM  LOS: 11 days

## 2016-02-17 NOTE — Progress Notes (Signed)
Pharmacy Antibiotic Note  Summer Howard is a 45 y.o. female admitted on 02/06/2016 with pneumonia.  Pharmacy has been consulted for Vancomycin and Zosyn dosing for patient with CT of chest showing lung abscess/necrotizing pneumonia. She was tx here for a possible thoracotomy.   Now day #12 of abx for lung abscess. L-CT placed 2/28 - f/u cx. Afebrile, WBC wnl. Trouble getting VT for the past day, VT came back this am high so vancomycin stopped. CVTS recommends transitioning to PO Augmentin for a total of 6 weeks.  Plan: Hold vancomycin Continue Zosyn 3.375gm IV Q8H, 4 hr infusion Monitor clinical picture, renal function, VR prn F/U bx deescalation / LOT, transition to PO Augmentin Recheck VR tomorrow morning if still on vancomycin  MRSA PCR negative, cx's no growth. Consider need to continue vancomycin for much longer   Height: 5\' 2"  (157.5 cm) Weight: 114 lb 1.6 oz (51.755 kg) IBW/kg (Calculated) : 50.1  Temp (24hrs), Avg:98.6 F (37 C), Min:98.4 F (36.9 C), Max:98.9 F (37.2 C)   Recent Labs Lab 02/12/16 0620 02/13/16 0535 02/13/16 0845 02/16/16 0745 02/17/16 0750  WBC 7.2 6.8  --  7.1  --   CREATININE 0.52 0.88  --  0.90 0.98  VANCOTROUGH  --   --  19  --  36*    Estimated Creatinine Clearance: 59.1 mL/min (by C-G formula based on Cr of 0.98).    Allergies  Allergen Reactions  . Phenobarbital Rash  . Sulfa Antibiotics Rash    Antimicrobials this admission: Zosyn 2/23 >>  Vancomycin 2/23 >>   Levels/Adjustments: 2/25 VT < 4 mcg/mL(collected early) on 750mg  q12 >> 1g q12  3/1 VT 9 on 1g q12 >> increased to 750 mg/8h  3/3 VT 19 - continued current rate 3/7 VT 36 - Vancomycin held  Microbiology results: 2/23 BCx x2 - NG  2/26 BAL - NG  2/26 AFB -  2/27 wound - NG  2/28 L-lung fluid from CT >> ngtd  Thank you for allowing pharmacy to be a part of this patient's care.  Summer Howard, PharmD, BCPS Clinical Pharmacist Pager [phone] 02/17/2016 9:10  AM

## 2016-02-18 ENCOUNTER — Inpatient Hospital Stay (HOSPITAL_COMMUNITY): Payer: Medicaid Other

## 2016-02-18 DIAGNOSIS — J851 Abscess of lung with pneumonia: Principal | ICD-10-CM

## 2016-02-18 DIAGNOSIS — E43 Unspecified severe protein-calorie malnutrition: Secondary | ICD-10-CM

## 2016-02-18 DIAGNOSIS — J850 Gangrene and necrosis of lung: Secondary | ICD-10-CM

## 2016-02-18 DIAGNOSIS — J189 Pneumonia, unspecified organism: Secondary | ICD-10-CM

## 2016-02-18 LAB — BASIC METABOLIC PANEL
Anion gap: 8 (ref 5–15)
CHLORIDE: 110 mmol/L (ref 101–111)
CO2: 27 mmol/L (ref 22–32)
CREATININE: 0.92 mg/dL (ref 0.44–1.00)
Calcium: 8.6 mg/dL — ABNORMAL LOW (ref 8.9–10.3)
GFR calc Af Amer: >60 mL/min (ref >60)
GFR calc non Af Amer: >60 mL/min (ref >60)
Glucose, Bld: 82 mg/dL (ref 65–99)
Potassium: 3.4 mmol/L — ABNORMAL LOW (ref 3.5–5.1)
SODIUM: 145 mmol/L (ref 135–145)

## 2016-02-18 LAB — CBC
HCT: 22.9 % — ABNORMAL LOW (ref 36.0–46.0)
Hemoglobin: 7.1 g/dL — ABNORMAL LOW (ref 12.0–15.0)
MCH: 26.5 pg (ref 26.0–34.0)
MCHC: 31 g/dL (ref 30.0–36.0)
MCV: 85.4 fL (ref 78.0–100.0)
PLATELETS: 406 10*3/uL — AB (ref 150–400)
RBC: 2.68 MIL/uL — ABNORMAL LOW (ref 3.87–5.11)
RDW: 15.1 % (ref 11.5–15.5)
WBC: 8.7 10*3/uL (ref 4.0–10.5)

## 2016-02-18 LAB — VANCOMYCIN, RANDOM: Vancomycin Rm: 12 ug/mL

## 2016-02-18 MED ORDER — VANCOMYCIN HCL IN DEXTROSE 1-5 GM/200ML-% IV SOLN
1000.0000 mg | Freq: Two times a day (BID) | INTRAVENOUS | Status: DC
Start: 2016-02-18 — End: 2016-02-18
  Administered 2016-02-18: 1000 mg via INTRAVENOUS
  Filled 2016-02-18 (×2): qty 200

## 2016-02-18 MED ORDER — OXYCODONE HCL 5 MG PO TABS
10.0000 mg | ORAL_TABLET | ORAL | Status: DC | PRN
  Administered 2016-02-18 – 2016-02-19 (×5): 10 mg via ORAL
  Filled 2016-02-18 (×5): qty 2

## 2016-02-18 MED ORDER — HYDROMORPHONE HCL 2 MG PO TABS
1.0000 mg | ORAL_TABLET | ORAL | Status: DC | PRN
  Administered 2016-02-18: 1 mg via ORAL
  Filled 2016-02-18: qty 1

## 2016-02-18 MED ORDER — AMOXICILLIN-POT CLAVULANATE 875-125 MG PO TABS
1.0000 | ORAL_TABLET | Freq: Two times a day (BID) | ORAL | Status: DC
  Administered 2016-02-18 – 2016-02-19 (×3): 1 via ORAL
  Filled 2016-02-18 (×3): qty 1

## 2016-02-18 MED ORDER — AMOXICILLIN-POT CLAVULANATE 875-125 MG PO TABS: 1.0000 | ORAL_TABLET | Freq: Two times a day (BID) | ORAL | Status: DC

## 2016-02-18 MED ORDER — MORPHINE SULFATE (PF) 2 MG/ML IV SOLN: 2.0000 mg | INTRAVENOUS | Status: DC | PRN

## 2016-02-18 MED ORDER — OXYCODONE HCL ER 10 MG PO T12A
10.0000 mg | EXTENDED_RELEASE_TABLET | Freq: Two times a day (BID) | ORAL | Status: DC
  Administered 2016-02-18 – 2016-02-19 (×2): 10 mg via ORAL
  Filled 2016-02-18 (×3): qty 1

## 2016-02-18 NOTE — Progress Notes (Addendum)
      [address]       [place] [id number]             [phone]       10 Days Post-Op Procedure(s) (LRB): VIDEO BRONCHOSCOPY WITH ENDOBRONCHIAL LAVAGE   Subjective: Patient without specific complaints this am.  Objective: Vital signs in last 24 hours: Temp:  [97.6 F (36.4 C)-98.8 F (37.1 C)] 97.6 F (36.4 C) (03/08 0517) Pulse Rate:  [59-88] 59 (03/08 0517) Cardiac Rhythm:  [-]  Resp:  [17-18] 18 (03/08 0517) BP: (127-150)/(72-92) 127/72 mmHg (03/08 0517) SpO2:  [100 %] 100 % (03/08 0517)      Intake/Output from previous day: 03/07 0701 - 03/08 0700 In: 2400 [P.O.:2400] Out: 4050 [Urine:4050]   Physical Exam:  Cardiovascular: RRR Pulmonary: Clear to auscultation on right and diminished left base; no rales, wheezes, or rhonchi. Wounds: Dressing is clean and dry.     Lab Results: CBC:  Recent Labs  02/16/16 0745 02/18/16 0520  WBC 7.1 8.7  HGB 9.1* 7.1*  HCT 30.1* 22.9*  PLT 441* 406*   BMET:   Recent Labs  02/17/16 0750 02/18/16 0520  NA 143 145  K 2.9* 3.4*  CL 107 110  CO2 28 27  GLUCOSE 85 82  BUN <5* <5*  CREATININE 0.98 0.92  CALCIUM 8.5* 8.6*    PT/INR: No results for input(s): LABPROT, INR in the last 72 hours. ABG:  INR: Will add last result for INR, ABG once components are confirmed Will add last 4 CBG results once components are confirmed  Assessment/Plan:  1. CV - SR in the 60's 2.  Pulmonary - Abscess lingular segment of LUL. IR removed drain yesterday. CXR this am shows small left pleural effusion, left lung abscess decreased. 3. Anemia-H and H down to 7.1 and 22.9. Per medicine 4.ID-on Vanco and Zosyn IV. Recommend transition to Augmentin for 6 weeks at discharge  ZIMMERMAN,DONIELLE MPA-C 02/18/2016,9:10 AM   I have seen and examined the patient and agree with the assessment and plan as outlined.  CXR looks good.  Okay for d/c home from our standpoint.  Purcell Nailslarence H Owen, MD 02/18/2016 4:07  PM

## 2016-02-18 NOTE — Progress Notes (Signed)
Pharmacy Antibiotic Note  Summer Howard is a 45 y.o. female admitted on 02/06/2016 with pneumonia.  Pharmacy has been consulted for Vancomycin and Zosyn dosing for patient with CT of chest showing lung abscess/necrotizing pneumonia.  Currently on day #13 of antibiotics.  Vancomycin has been on hold due to supra-therapeutic through.  Vancomycin random level this AM has decreased to below desired range.  Patient's renal function has been stable.  Plan: - Resume vanc 1gm IV Q12H - Continue Zosyn 3.375gm IV Q8H, 4 hr infusion - Monitor renal function, clinical progress, vanc trough at [facility] - F/U with transitioning patient to Augmentin   Height: 5\' 2"  (157.5 cm) Weight: 114 lb 1.6 oz (51.755 kg) IBW/kg (Calculated) : 50.1  Temp (24hrs), Avg:98.3 F (36.8 C), Min:97.6 F (36.4 C), Max:98.8 F (37.1 C)   Recent Labs Lab 02/12/16 0620 02/13/16 0535 02/13/16 0845 02/16/16 0745 02/17/16 0750 02/18/16 0520  WBC 7.2 6.8  --  7.1  --  8.7  CREATININE 0.52 0.88  --  0.90 0.98 0.92  VANCOTROUGH  --   --  19  --  36*  --   VANCORANDOM  --   --   --   --   --  12    Estimated Creatinine Clearance: 63 mL/min (by C-G formula based on Cr of 0.92).    Allergies  Allergen Reactions  . Phenobarbital Rash  . Sulfa Antibiotics Rash    Antimicrobials this admission: Zosyn 2/23 >>  Vanc 2/23 >>   Levels/Adjustments: 2/25 VT < 4 mcg/mL(collected early) on 750mg  q12 >> 1g q12 3/1 VT 9 on 1g q12 >> increased to 750 mg/8h 3/3 VT 19  - no change 3/7 VT 36 - held 3/5 VT = 12 mcg/mL (ke = 0.0511, t1/2 = 12 hrs)  Microbiology results: 2/23 BCx x2 - NG  2/26 BAL - NG  2/26 AFB -  2/27 wound - NG  2/28 L-lung fluid from CT >> ngtd   Lavonn Maxcy D. Laney Potashang, PharmD, BCPS Pager:  [phone] 02/18/2016, 7:40 AM

## 2016-02-18 NOTE — Progress Notes (Signed)
PROGRESS NOTE  Ferdinand Langoshley L Gotham WUJ:[id number] DOB: [birth year] DOA: 02/06/2016 PCP: Rafael BihariWALKER III, JOHN B, MD  HPI/Recap of past 24 hours: Patient is a 45 year old female with past history of anxiety, depression, s/p gastric bypass, hx of eating disorder and thrombocytopenia who last month was hospitalized for community-acquired pneumonia. Following discharge, she had persistent fevers and pain, she underwent CT scan on which noted large pneumonia possibly necrotizing with lung abscess and patient readmitted at [facility] on 2/23.   Due to complexity of fluid, ultrasound-guided thoracentesis not successful. Case discussed with [facility] in felt that patient needed to be [facility] which was done on 2/24.  There, cardiac thoracic surgery performed video bronchoscopy and then CT-guided left chest tube placement on 2/28. Cultures and cytology were negative. Patient continued on broad-spectrum antibiotics. Follow-up CT scan done 3/7 noted 50% improved and abscess and chest tube discontinued.  Today, antibiotics changed to by mouth which she will need for 6 more weeks. Pain medication also change to by mouth which patient has had a difficult time controlling.  Assessment/Plan: Principal Problem:   Community-acquired pneumonia with complications of Lung abscess [facility](HCC): Status post chest tube Active Problems:   Primary thrombocytopenia (HCC): Has remained stable    Iron deficiency anemia: Discharge on by mouth iron     Protein-calorie malnutrition, severe/malnutrition following gastrointestinal surgery: Patient meets criteria in the context of acute illness and injury. Seen by nutrition. On culture and multivitamin 3 times a day plus boost breeze 3 times a day   Code Status: Full code   Family Communication: Spoke with dad by phone   Disposition Plan: Potentially home tomorrow if tolerating pain     Consultants:  Cardiothoracic  surgery  Procedures:  RUE PICC line 2/25. Site looks clean and dry.  Video bronchoscopy with endobronchial lavage 2/26  CT-guided left chest tube placed by IR on 2/28.  Antibiotics:  IV Zosyn 2/24-3/8  IV vancomycin 01/15/69  Diflucan 3/11 dose  Augmentin oral 3/8-present   Objective: BP 146/79 mmHg  Pulse 73  Temp(Src) 100 F (37.8 C) (Oral)  Resp 18  Ht 5\' 2"  (1.575 m)  Wt 51.755 kg (114 lb 1.6 oz)  BMI 20.86 kg/m2  SpO2 100%  Intake/Output Summary (Last 24 hours) at 02/18/16 1446 Last data filed at 02/18/16 1300  Gross per 24 hour  Intake   2160 ml  Output   3100 ml  Net   -940 ml   Filed Weights   02/06/16 1734 02/11/16 1110  Weight: 54.432 kg (120 lb) 51.755 kg (114 lb 1.6 oz)    Exam:   General:  Alert and oriented 3, no acute distress  Cardiovascular: Regular rate and rhythm, S1-S2   Respiratory: Clear to auscultation bilaterally   Abdomen: Soft, nontender, nondistended, positive bowel sounds   Musculoskeletal: No clubbing or cyanosis or edema    Data Reviewed: Basic Metabolic Panel:  Recent Labs Lab 02/12/16 0620 02/13/16 0535 02/16/16 0745 02/17/16 0750 02/18/16 0520  NA 142 145 145 143 145  K 2.9* 3.5 3.1* 2.9* 3.4*  CL 107 109 107 107 110  CO2 28 26 28 28 27   GLUCOSE 85 80 85 85 82  BUN <5* <5* <5* <5* <5*  CREATININE 0.52 0.88 0.90 0.98 0.92  CALCIUM 8.5* 8.7* 8.6* 8.5* 8.6*  MG  --   --   --  2.1  --    Liver Function Tests: No results for input(s): AST, ALT,  ALKPHOS, BILITOT, PROT, ALBUMIN in the last 168 hours. No results for input(s): LIPASE, AMYLASE in the last 168 hours. No results for input(s): AMMONIA in the last 168 hours. CBC:  Recent Labs Lab 02/12/16 0620 02/13/16 0535 02/16/16 0745 02/18/16 0520  WBC 7.2 6.8 7.1 8.7  HGB 7.7* 7.7* 9.1* 7.1*  HCT 24.1* 24.9* 30.1* 22.9*  MCV 82.8 84.1 84.6 85.4  PLT 530* 570* 441* 406*   Cardiac Enzymes:   No results for input(s): CKTOTAL, CKMB, CKMBINDEX,  TROPONINI in the last 168 hours. BNP (last 3 results) No results for input(s): BNP in the last 8760 hours.  ProBNP (last 3 results) No results for input(s): PROBNP in the last 8760 hours.  CBG: No results for input(s): GLUCAP in the last 168 hours.  Recent Results (from the past 240 hour(s))  Wound culture     Status: None   Collection Time: 02/09/16  6:01 AM  Result Value Ref Range Status   Specimen Description WOUND  Final   Special Requests   Final    PICC LINE SITE PATIENT ON FOLLOWING VANCOMYCIN,ZOSYN   Gram Stain   Final    RARE WBC PRESENT, PREDOMINANTLY PMN NO SQUAMOUS EPITHELIAL CELLS SEEN NO ORGANISMS SEEN Performed at [facility]    Culture   Final    NO GROWTH 2 DAYS Performed at [facility]    Report Status 02/12/2016 FINAL  Final  Body fluid culture     Status: None   Collection Time: 02/10/16 11:01 AM  Result Value Ref Range Status   Specimen Description FLUID LEFT LUNG  Final   Special Requests LEFT UPPER LOBE  Final   Gram Stain   Final    ABUNDANT WBC PRESENT,BOTH PMN AND MONONUCLEAR NO ORGANISMS SEEN    Culture NO GROWTH 3 DAYS  Final   Report Status 02/14/2016 FINAL  Final     Studies: Dg Chest 2 View  02/18/2016  CLINICAL DATA:  Lung abscess.  Pneumonia. EXAM: CHEST  2 VIEW COMPARISON:  CT 02/16/2016.  Chest x-ray 02/12/2016. FINDINGS: Interim removal of left chest drainage catheter. Right PICC line noted with tip projected superior vena cava. Mediastinum hilar structures normal. Heart size normal. Persistent but decreased in size in left left base fluid collection/abscess. Persistent small left pleural effusion. No significant pneumothorax. IMPRESSION: 1. Interim removal of left chest drainage catheter. Left lung base fluid collection/abscess has decreased in size. No significant pneumothorax. 2. Persistent small left pleural effusion. 3. Right PICC line in stable position. Electronically Signed   By: Maisie Fus  Register   On: 02/18/2016  07:40    Scheduled Meds: . acidophilus  2 capsule Oral Daily  . amoxicillin-clavulanate  1 tablet Oral BID WC  . enoxaparin (LOVENOX) injection  40 mg Subcutaneous Q24H  . feeding supplement  1 Container Oral TID BM  . ferrous fumarate-b12-vitamic C-folic acid  1 capsule Oral TID PC  . guaiFENesin  1,200 mg Oral BID  . pantoprazole  40 mg Oral Daily  . saccharomyces boulardii  250 mg Oral BID  . sodium chloride flush  3 mL Intravenous Q12H  . sodium chloride flush  3 mL Intravenous Q12H  . sucralfate  1 g Oral TID WC    Continuous Infusions:    Time spent: 25 minutes  Hollice Espy  [facility] Pager [phone] . If 7PM-7AM, please contact night-coverage at [email or web address], password [facility] 02/18/2016, 2:46 PM  LOS: 12 days

## 2016-02-19 DIAGNOSIS — D509 Iron deficiency anemia, unspecified: Secondary | ICD-10-CM

## 2016-02-19 MED ORDER — OXYCODONE HCL ER 10 MG PO T12A: 10.0000 mg | EXTENDED_RELEASE_TABLET | Freq: Two times a day (BID) | ORAL | Status: DC

## 2016-02-19 MED ORDER — OXYCODONE HCL 10 MG PO TABS: 10.0000 mg | ORAL_TABLET | Freq: Four times a day (QID) | ORAL | Status: DC | PRN

## 2016-02-19 MED ORDER — BOOST / RESOURCE BREEZE PO LIQD: 1.0000 | Freq: Three times a day (TID) | ORAL | Status: DC

## 2016-02-19 MED ORDER — AMOXICILLIN-POT CLAVULANATE 875-125 MG PO TABS: 1.0000 | ORAL_TABLET | Freq: Two times a day (BID) | ORAL | Status: DC

## 2016-02-19 MED ORDER — SUCRALFATE 1 GM/10ML PO SUSP: 1.0000 g | Freq: Three times a day (TID) | ORAL | Status: DC

## 2016-02-19 NOTE — Discharge Summary (Signed)
Discharge Summary  Summer Howard:[id number] DOB: Nov 08, 1971  PCP: Rafael Bihari, MD  Admit date: 02/06/2016 Discharge date: 02/19/2016  Time spent: 25 minutes  Recommendations for Outpatient Follow-up:  1. New medication: OxyContin extended release 10 mg every 12 hours 1 week 2. New medication: Oxycodone IR 10 mg every 6 hours as needed for pain 3. New medication: Augmentin 875/125 by mouth twice a day 6 weeks 4. Patient will follow-up with Dr. Cornelius Moras on 3/27 5. Pt will follow up at her PCP's office on Monday 3/13 for hemoglobin check  Discharge Diagnoses:  Active Hospital Problems   Diagnosis Date Noted  . Lung abscess (HCC) 02/03/2016  . Protein-calorie malnutrition, severe 02/15/2016  . Protein-calorie malnutrition (HCC)   . Necrotizing pneumonia (HCC) 02/06/2016  . Pleural effusion 02/06/2016  . Hemoptysis 02/06/2016  . Iron deficiency anemia   . CAP (community acquired pneumonia) 01/21/2016  . Chest pain with low risk for cardiac etiology 01/21/2016  . Pleuritic chest pain 01/20/2016  . Pneumonia 01/20/2016  . 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 Absolute anemia 10/22/2014  . Malnutrition following gastrointestinal surgery 10/22/2014  . Primary thrombocytopenia (HCC) 10/02/2014    Resolved Hospital Problems   Diagnosis Date Noted Date Resolved  No resolved problems to display.    Discharge Condition: Improved, being discharged home   Diet recommendation: Low-sodium diet with boost 3 times a day   Filed Weights   02/06/16 1734 02/11/16 1110  Weight: 54.432 kg (120 lb) 51.755 kg (114 lb 1.6 oz)    History of present illness:  Patient is a 45 year old female with past history of anxiety, depression, s/p gastric bypass, hx of eating disorder and thrombocytopenia who last month was hospitalized for community-acquired pneumonia. Following discharge, she had persistent fevers and pain, she underwent CT scan on which noted large pneumonia possibly  necrotizing with lung abscess and patient readmitted at [facility] on 2/23.  Due to complexity of fluid, ultrasound-guided thoracentesis not successful. Case discussed with cardiothoracic surgery at [facility] in felt that patient needed to be transferred to [facility] which was done on 2/24.  Hospital Course:  Principal Problem:   Community acquired pneumonia with complications of Lung abscess (HCC) status post chest tube:There, cardiac thoracic surgery performed video bronchoscopy and then CT-guided left chest tube placement on 2/28. Cultures and cytology were negative. Patient continued on broad-spectrum antibiotics. Follow-up CT scan done 3/7 noted 50% improved and abscess and chest tube discontinued.There, cardiac thoracic surgery performed video bronchoscopy and then CT-guided left chest tube placement on 2/28. Cultures and cytology were negative. Patient continued on broad-spectrum antibiotics. Follow-up CT scan done 3/7 noted 50% improved and abscess and chest tube discontinued.  Patient changed over to antibiotic mouth on 3/8. She will need to continue Augmentin for another 6 weeks. Repeat chest x-ray looks good noting decreased abscess and only small left pleural effusion. Follow-up with cardiothoracic surgery as outpatient Active Problems:    Absolute anemia and patient with history of poor iron absorption and chronic gastrojejunal ulcer: During hospitalization, hemoglobin has ranged from 7-9. On day of discharge, hemoglobin 7.1. I sat and had an extensive discussion with patient. Given stable vital signs and not feeling short of breath and her eagerness to go home, I felt that this is reasonable. We'll arrange for her to have a repeat H&H on Monday, 3/13, 4 days from now at her PCPs office. This is not blood loss anemia given that her hemoglobin was 9.12 days ago  and 7.72 days before that.    Primary thrombocytopenia (HCC):-Stable. Platelet count at 406 on day of  discharge   Protein-calorie malnutrition, severe/malnutrition following gastrointestinal surgery: Patient meets criteria in the context of acute illness and injury. Seen by nutrition. On foltrin  multivitamin 3 times a day plus boost breeze 3 times a day   Consultants:  Cardiothoracic surgery  Procedures:  RUE PICC line 2/25. Site looks clean and dry.  Video bronchoscopy with endobronchial lavage 2/26  CT-guided left chest tube placed by IR on 2/28.  Discharge Exam: BP 143/80 mmHg  Pulse 69  Temp(Src) 98.3 F (36.8 C) (Oral)  Resp 17  Ht  (1.575 m)  Wt 51.755 kg (114 lb 1.6 oz)  BMI 20.86 kg/m2  SpO2 95%  General: Alert and oriented 3  Cardiovascular: Regular rate and rhythm, S1-S2  Respiratory: Clear to auscultation bilaterally   Discharge Instructions You were cared for by a hospitalist during your hospital stay. If you have any questions about your discharge medications or the care you received while you were in the hospital after you are discharged, you can call the unit and asked to speak with the hospitalist on call if the hospitalist that took care of you is not available. Once you are discharged, your primary care physician will handle any further medical issues. Please note that NO REFILLS for any discharge medications will be authorized once you are discharged, as it is imperative that you return to your primary care physician (or establish a relationship with a primary care physician if you do not have one) for your aftercare needs so that they can reassess your need for medications and monitor your lab values.  Discharge Instructions    Diet - low sodium heart healthy    Complete by:  As directed      Increase activity slowly    Complete by:  As directed             Medication List    STOP taking these medications        piperacillin-tazobactam 3.375 GM/50ML IVPB  Commonly known as:  ZOSYN     Vancomycin 750 MG/150ML Soln  Commonly known as:   VANCOCIN      TAKE these medications        acetaminophen 500 MG tablet  Commonly known as:  TYLENOL  Take 1,000 mg by mouth every 6 (six) hours as needed for mild pain, fever or headache.     acidophilus Caps capsule  Take 2 capsules by mouth daily.     amoxicillin-clavulanate 875-125 MG tablet  Commonly known as:  AUGMENTIN  Take 1 tablet by mouth 2 (two) times daily.     aspirin EC 81 MG tablet  Take 1 tablet (81 mg total) by mouth daily.     calcium carbonate 500 MG chewable tablet  Commonly known as:  TUMS - dosed in mg elemental calcium  Chew 2 tablets by mouth as needed for indigestion or heartburn.     feeding supplement Liqd  Take 1 Container by mouth 3 (three) times daily between meals.     multivitamin with minerals Tabs tablet  Take 1 tablet by mouth daily.     oxyCODONE 10 mg 12 hr tablet  Commonly known as:  OXYCONTIN  Take 1 tablet (10 mg total) by mouth every 12 (twelve) hours.     Oxycodone HCl 10 MG Tabs  Take 1 tablet (10 mg total) by mouth every 6 (six) hours as  needed for breakthrough pain.     pantoprazole 40 MG tablet  Commonly known as:  PROTONIX  Take 40 mg by mouth daily.     sucralfate 1 GM/10ML suspension  Commonly known as:  CARAFATE  Take 10 mLs (1 g total) by mouth 3 (three) times daily with meals.     ZOFRAN 4 MG tablet  Generic drug:  ondansetron  Take 4 mg by mouth every 8 (eight) hours as needed for nausea or vomiting.       Allergies  Allergen Reactions  . Phenobarbital Rash  . Sulfa Antibiotics Rash       Follow-up Information    Follow up with Purcell Nails, MD On 03/08/2016.   Specialty:  Cardiothoracic Surgery   Why:  PA/LAT CXR to be taken (at [facility] which is in the same building as Dr. Orvan July office) on 03/08/2016 at 2:15 pm;Appointment time is at 3:00 pm   Contact information:   [address] [employer] [address] [place] [address] [phone]       Follow up with Danne Harbor, Letta Pate, MD In 1 month.    Specialty:  Internal Medicine   Why:  As needed   Contact information:   [address] [facility] [place] [address] [phone]        The results of significant diagnostics from this hospitalization (including imaging, microbiology, ancillary and laboratory) are listed below for reference.    Significant Diagnostic Studies: Dg Chest 2 View  02/18/2016  CLINICAL DATA:  Lung abscess.  Pneumonia. EXAM: CHEST  2 VIEW COMPARISON:  CT 02/16/2016.  Chest x-ray 02/12/2016. FINDINGS: Interim removal of left chest drainage catheter. Right PICC line noted with tip projected superior vena cava. Mediastinum hilar structures normal. Heart size normal. Persistent but decreased in size in left left base fluid collection/abscess. Persistent small left pleural effusion. No significant pneumothorax. IMPRESSION: 1. Interim removal of left chest drainage catheter. Left lung base fluid collection/abscess has decreased in size. No significant pneumothorax. 2. Persistent small left pleural effusion. 3. Right PICC line in stable position. Electronically Signed   By: Maisie Fus  Register   On: 02/18/2016 07:40   Dg Chest 2 View  02/11/2016  CLINICAL DATA:  Left lung abscess with small caliber urge caliber chest tube placement. EXAM: CHEST  2 VIEW COMPARISON:  CT scan of the chest and chest x-ray of February 28 and February 09, 2016. FINDINGS: The small caliber chest tube projects over the lower third of the left hemi thorax. There is pleural fluid lateral and inferior to this. There is no pneumothorax. There is no mediastinal shift. The right lung is clear. The heart and pulmonary vascularity are normal. The PICC line tip projects over the midportion of the SVC. The bony thorax is unremarkable. IMPRESSION: The small caliber chest tube lies adjacent to the area of parenchymal abnormality in the left lower lung. Correlation with the functioning of the tube is needed. Overall there has not been significant  change in the appearance of the empyema. Elsewhere the chest is unremarkable. Electronically Signed   By: David  [place] M.D.   On: 02/11/2016 08:00   Dg Chest 2 View  02/09/2016  CLINICAL DATA:  Lung abscess. EXAM: CHEST  2 VIEW COMPARISON:  Chest radiograph February 08, 2016; chest CT February 03, 2016 FINDINGS: Central catheter tip is in the superior vena cava. No pneumothorax is apparent on this study. The extensive consolidation in the left lower lobe persists without appreciable  change. The lucency consistent with abscess is new appreciable by radiography but better seen by CT. This area of irregular air collection in the left lower lobe measures 3.5 x 2.8 x 2.4 cm by radiography. There is a small left pleural effusion as well. Elsewhere lungs are clear. The heart size and pulmonary vascularity appear normal. No adenopathy is demonstrable by radiography. IMPRESSION: Persistent left lower lobe abscess with consolidation and irregular air collection, better seen on CT. Small left pleural effusion. No new opacity. No pneumothorax. Central catheter tip in superior vena cava. Cardiac silhouette within normal limits. Electronically Signed   By: Bretta BangWilliam  Woodruff III M.D.   On: 02/09/2016 07:23   Dg Chest 2 View  02/08/2016  CLINICAL DATA:  Atelectasis, lung abscess, pneumonia EXAM: CHEST  2 VIEW COMPARISON:  02/03/2016 CT FINDINGS: Left pleural effusion with left basilar airspace opacity not significantly changed. Right lung is clear. Right PICC line is in place with the tip in the SVC. Heart is normal size. IMPRESSION: Continued left pleural effusion and left basilar airspace disease, not significantly changed. Electronically Signed   By: Charlett NoseKevin  Dover M.D.   On: 02/08/2016 07:49   Dg Chest 2 View  01/21/2016  CLINICAL DATA:  Pneumonia EXAM: CHEST  2 VIEW COMPARISON:  None. FINDINGS: There is airspace consolidation throughout much of the left lower lobe as well as portions of the lingula. Right lung is clear.  Heart size and pulmonary vascularity are normal. No adenopathy. There is a small bone island in the right proximal humerus. IMPRESSION: Extensive pneumonia involving portions of the left lower lobe and lingula. Right lung clear. Cardiac silhouette within normal limits. Followup PA and lateral chest radiographs recommended in 3-4 weeks following trial of antibiotic therapy to ensure resolution and exclude underlying malignancy. Electronically Signed   By: Bretta BangWilliam  Woodruff III M.D.   On: 01/21/2016 07:18   Ct Chest W Contrast  02/16/2016  CLINICAL DATA:  Followup left lung abscess EXAM: CT CHEST WITH CONTRAST TECHNIQUE: Multidetector CT imaging of the chest was performed during intravenous contrast administration. CONTRAST:  75 cc of Omnipaque 300 COMPARISON:  02/03/2016 FINDINGS: Mediastinum: Heart size is normal. No pericardial effusion identified. The trachea appears patent and is midline. No enlarged mediastinal or hilar lymph nodes. No axillary or supraclavicular adenopathy. Lungs/Pleura: There is a small loculated hydropneumothorax overlying the posterior left lower lobe. Percutaneous pigtail drainage catheter within the left midlung is again identified. The catheter appears well positioned within the fluid collection. The left mid lung abscess measures 5.1 x 5.1 x 5.4 cm or ~ 70 cc, image number 30 of series 3 and image 55 of series 5. On the previous exam this measured 6.7 x 6.2 x 6.8 cm or ~141 cc. Atelectasis is identified within the left lower lobe. The central air coal component has resolved in the interval. Upper Abdomen: No acute findings identified. Musculoskeletal: No aggressive lytic or sclerotic bone lesions. Mild spondylosis identified within the thoracic spine. IMPRESSION: 1. The left midlung abscess has decreased in volume by approximately 50% when compared with study from 02/03/2016. There has been resolution of the central air component. The percutaneous drainage catheter remains well  positioned within the abscess. 2. Persistent loculated left pleural effusion overlying the posterior left lower lobe. Electronically Signed   By: Signa Kellaylor  Stroud M.D.   On: 02/16/2016 16:20   Ct Chest W Contrast  02/04/2016  CLINICAL DATA:  Severe left-sided chest pain and congestion, recent diagnosis of pneumonia, completed antibiotic therapy EXAM:  CT CHEST WITH CONTRAST TECHNIQUE: Multidetector CT imaging of the chest was performed during intravenous contrast administration. CONTRAST:  75mL OMNIPAQUE IOHEXOL 300 MG/ML  SOLN COMPARISON:  Chest x-ray of 01/21/2016 FINDINGS: There is airspace disease throughout the inferior lateral aspect of the left upper lobe extending peripherally with areas of cavitation most consistent with necrotizing pneumonia, generally bacterial or viral in origin. Some involvement of the superior segment of the left lower lobe also is noted. Minimal opacity is noted within the anterior inferior right upper lobe on image 26, and in the posterior right lower lobe on image 35. These may represent areas of early infiltrate as well. There is a moderate size left pleural effusion present. The right lung is clear. The central airway is patent. On soft tissue window images, the thyroid gland is unremarkable. On this enhanced study, the thoracic aorta opacifies with no significant abnormality noted. The pulmonary arteries are not as well opacified but no central abnormality is evident. No mediastinal or hilar adenopathy is seen. Surgical sutures are noted from prior gastric bypass surgery. Clips are present from prior cholecystectomy. The thoracic vertebrae are in normal alignment with normal disc spaces. IMPRESSION: 1. Extensive parenchymal infiltrate throughout the left upper lobe and lingula as well as the left lower lobe with areas of cavitation primarily involving the inferior left upper lobe and lingula. In view of the extensive involvement, necrotizing pneumonia is the primary consideration  generally bacterial or fungal in origin. Patchy areas of involvement also are noted in the right upper and right lower lobes. 2. Moderate size left pleural effusion. Electronically Signed   By: Dwyane Dee M.D.   On: 02/04/2016 08:17   Dg Chest Port 1 View  02/12/2016  CLINICAL DATA:  Empyema.  Chest tube. EXAM: PORTABLE CHEST 1 VIEW COMPARISON:  02/11/2016.  CT 02/10/2016, 02/03/2016. FINDINGS: Right PICC line and left chest tube in stable position. Left lung/pleural consolidation is stable with stable positioning of the left chest tube in this consolidation. No pneumothorax. Mediastinum and hilar structures are normal. Heart size stable. No acute bony abnormality . IMPRESSION: 1. Left chest tube in stable with stable location within the left lower chest consolidation. No interim change. 2. Right PICC line stable position . Electronically Signed   By: Maisie Fus  Register   On: 02/12/2016 07:58   Dg Chest Port 1 View  02/08/2016  CLINICAL DATA:  Lung abscess post bronchoscopy EXAM: PORTABLE CHEST 1 VIEW COMPARISON:  02/07/2015 FINDINGS: No pneumothorax following bronchoscopy. Continued left lower lobe opacity and left effusion. Right lung is clear. Right PICC line is unchanged. Heart is normal size. IMPRESSION: Continued left lower lobe opacity and left effusion. No pneumothorax following bronchoscopy. Electronically Signed   By: Charlett Nose M.D.   On: 02/08/2016 12:19   Ct Image Guided Fluid Drain By Catheter  02/10/2016  INDICATION: 45 year old with pneumonia and left lung abscess. Request for percutaneous drainage of this abscess collection. EXAM: CT-GUIDED PLACEMENT OF DRAINAGE CATHETER IN THE LEFT LUNG ABSCESS MEDICATIONS: The patient is currently admitted to the hospital and receiving intravenous antibiotics. The antibiotics were administered within an appropriate time frame prior to the initiation of the procedure. ANESTHESIA/SEDATION: Fentanyl 150 mcg IV; Versed 4.0 mg IV Moderate Sedation Time:   The patient was continuously monitored during the procedure by the interventional radiology nurse under my direct supervision. COMPLICATIONS: None immediate. PROCEDURE: Informed written consent was obtained from the patient after a thorough discussion of the procedural risks, benefits and alternatives. All questions  were addressed. Maximal Sterile Barrier Technique was utilized including caps, mask, sterile gowns, sterile gloves, sterile drape, hand hygiene and skin antiseptic. A timeout was performed prior to the initiation of the procedure. Patient was placed on the CT scanner and the left side of the chest was elevated. Images through the chest were obtained. The gas-filled collection in the left upper lobe was targeted. The left side of the chest was prepped with chlorhexidine. Sterile field was created. Skin was anesthetized with 1% lidocaine. A Yueh catheter was directed into the pleural space with CT guidance. Small amount air was aspirated. A stiff Amplatz wire was advanced through the Yueh catheter. The tract was dilated to accommodate a 14 French pigtail catheter. The catheter was advanced over the wire without complication. Small amount of additional air was aspirated. Initially, no fluid was aspirated. Eventually, a small amount of bloody fluid was draining from the catheter. Initially, the catheter was attached to a suction bulb and then switched to a Pleur-Evac chest tube drain. Catheter was sutured to the skin. FINDINGS: Large complex air-fluid structure in the left upper lobe compatible with an abscess. There is surrounding consolidation. Volume loss and consolidation in left lower lobe. Catheter was placed within the air-fluid collection. Decompression of abscess following drain placement. Minimal output from the drain. IMPRESSION: CT-guided placement of a drainage catheter within the left lung abscess collection. Electronically Signed   By: Richarda Overlie M.D.   On: 02/10/2016 12:52   US  Thoracentesis Asp Pleural Space W/img Guide  02/05/2016  CLINICAL DATA:  Cavitary left lung pneumonia, parapneumonic effusion EXAM: ULTRASOUND GUIDED LEFT THORACENTESIS COMPARISON:  02/03/2016 PROCEDURE: An ultrasound guided thoracentesis was thoroughly discussed with the patient and questions answered. The benefits, risks, alternatives and complications were also discussed. The patient understands and wishes to proceed with the procedure. Written consent was obtained. Ultrasound was performed to localize and mark an adequate pocket of fluid in the right chest. The area was then prepped and draped in the normal sterile fashion. 1% Lidocaine was used for local anesthesia. Under ultrasound guidance a 19 gauge Yueh catheter was initially introduced. Fluid could not be aspirated. Pleural effusion appears complex by ultrasound. Needle position confirmed with ultrasound. Next, a second access was performed at the interspace above. This also was done under sterile conditions and local anesthesia. Catheter position confirmed in the pleural space with ultrasound. Syringe aspiration failed to yield any fluid. Next, a safety centesis needle catheter was utilized. This also was inserted under sterile conditions and local anesthesia into the pleural space on the left side. Position confirmed with ultrasound. Syringe aspiration yielded scant blood-tinged pleural fluid. After the third attempt, procedure was stopped. Sample was not sent for laboratory analysis. Complications:  None. FINDINGS: Ultrasound imaging confirms a complex hypoechoic small left effusion. Ultrasound thoracentesis performed however because of the complexity of the fluid only a scant 1-2 cc sample could be obtained. IMPRESSION: Successful ultrasound guided left thoracentesis yielding only a small amount of blood tinged left pleural fluid. Sample was not sent for laboratory analysis. Findings discussed with Dr. Thelma Barge. Electronically Signed   By: Judie Petit.  Shick M.D.    On: 02/05/2016 16:13    Microbiology: Recent Results (from the past 240 hour(s))  Body fluid culture     Status: None   Collection Time: 02/10/16 11:01 AM  Result Value Ref Range Status   Specimen Description FLUID LEFT LUNG  Final   Special Requests LEFT UPPER LOBE  Final   Gram Stain  Final    ABUNDANT WBC PRESENT,BOTH PMN AND MONONUCLEAR NO ORGANISMS SEEN    Culture NO GROWTH 3 DAYS  Final   Report Status 02/14/2016 FINAL  Final     Labs: Basic Metabolic Panel:  Recent Labs Lab 02/13/16 0535 02/16/16 0745 02/17/16 0750 02/18/16 0520  NA 145 145 143 145  K 3.5 3.1* 2.9* 3.4*  CL 109 107 107 110  CO2 GLUCOSE 80 85 85 82  BUN <5* <5* <5* <5*  CREATININE 0.88 0.90 0.98 0.92  CALCIUM 8.7* 8.6* 8.5* 8.6*  MG  --   --  2.1  --    Liver Function Tests: No results for input(s): AST, ALT, ALKPHOS, BILITOT, PROT, ALBUMIN in the last 168 hours. No results for input(s): LIPASE, AMYLASE in the last 168 hours. No results for input(s): AMMONIA in the last 168 hours. CBC:  Recent Labs Lab 02/13/16 0535 02/16/16 0745 02/18/16 0520  WBC 6.8 7.1 8.7  HGB 7.7* 9.1* 7.1*  HCT 24.9* 30.1* 22.9*  MCV 84.1 84.6 85.4  PLT 570* 441* 406*   Cardiac Enzymes: No results for input(s): CKTOTAL, CKMB, CKMBINDEX, TROPONINI in the last 168 hours. BNP: BNP (last 3 results) No results for input(s): BNP in the last 8760 hours.  ProBNP (last 3 results) No results for input(s): PROBNP in the last 8760 hours.  CBG: No results for input(s): GLUCAP in the last 168 hours.     Signed:  Hollice Espy  [facility] 02/19/2016, 12:54 PM

## 2016-02-24 ENCOUNTER — Telehealth: Payer: Self-pay | Admitting: *Deleted

## 2016-02-24 ENCOUNTER — Inpatient Hospital Stay: Payer: Medicaid Other

## 2016-02-24 ENCOUNTER — Inpatient Hospital Stay: Payer: Medicaid Other | Attending: Internal Medicine

## 2016-02-24 DIAGNOSIS — D509 Iron deficiency anemia, unspecified: Secondary | ICD-10-CM | POA: Diagnosis present

## 2016-02-24 DIAGNOSIS — D508 Other iron deficiency anemias: Secondary | ICD-10-CM

## 2016-02-24 LAB — CBC WITH DIFFERENTIAL/PLATELET
Basophils Absolute: 0.1 10*3/uL (ref 0–0.1)
Basophils Relative: 1 %
Eosinophils Absolute: 0.1 10*3/uL (ref 0–0.7)
Eosinophils Relative: 1 %
HCT: 31.5 % — ABNORMAL LOW (ref 35.0–47.0)
Hemoglobin: 10.2 g/dL — ABNORMAL LOW (ref 12.0–16.0)
Lymphocytes Relative: 13 %
Lymphs Abs: 0.9 10*3/uL — ABNORMAL LOW (ref 1.0–3.6)
MCH: 26.6 pg (ref 26.0–34.0)
MCHC: 32.2 g/dL (ref 32.0–36.0)
MCV: 82.4 fL (ref 80.0–100.0)
Monocytes Absolute: 0.5 10*3/uL (ref 0.2–0.9)
Monocytes Relative: 6 %
Neutro Abs: 5.8 10*3/uL (ref 1.4–6.5)
Neutrophils Relative %: 79 %
Platelets: 448 10*3/uL — ABNORMAL HIGH (ref 150–440)
RBC: 3.82 MIL/uL (ref 3.80–5.20)
RDW: 16.2 % — ABNORMAL HIGH (ref 11.5–14.5)
WBC: 7.3 10*3/uL (ref 3.6–11.0)

## 2016-02-24 LAB — RETICULOCYTES
RBC.: 3.82 MIL/uL (ref 3.80–5.20)
RETIC COUNT ABSOLUTE: 110.8 10*3/uL (ref 19.0–183.0)
RETIC CT PCT: 2.9 % (ref 0.4–3.1)

## 2016-02-24 LAB — IRON AND TIBC
Iron: 28 ug/dL (ref 28–170)
Saturation Ratios: 8 % — ABNORMAL LOW (ref 10.4–31.8)
TIBC: 337 ug/dL (ref 250–450)
UIBC: 309 ug/dL

## 2016-02-24 LAB — LACTATE DEHYDROGENASE: LDH: 127 U/L (ref 98–192)

## 2016-02-24 LAB — FERRITIN: Ferritin: 191 ng/mL (ref 11–307)

## 2016-02-24 NOTE — Telephone Encounter (Signed)
MD would like pt to come today for lab only and we will call her if iv iron is needed sooner.  MD wanted labs orginally dated for June 2016 to be drawn today. I changed the date for these labs per md order.

## 2016-02-24 NOTE — Telephone Encounter (Signed)
Recently hospitalized for pneumonia and her HGB was noted to be low, she reports that she recently had an iron infusion (01/30/16). She is complaining of increased SOB and weakness. Asking what needs to be done, Please advise    Ref Range  6d ago  8d ago    WBC 4.0 - 10.5 K/uL 8.7 7.1    RBC 3.87 - 5.11 MIL/uL 2.68 (L) 3.56 (L)    Hemoglobin 12.0 - 15.0 g/dL 7.1 (L) 9.1 (L)   Comments: DELTA CHECK NOTED  REPEATED TO VERIFY      HCT 36.0 - 46.0 % 22.9 (L) 30.1 (L)    MCV 78.0 - 100.0 fL 85.4 84.6    MCH 26.0 - 34.0 pg 26.5 25.6 (L)    MCHC 30.0 - 36.0 g/dL 16.131.0 09.630.2    RDW 04.511.5 - 15.5 % 15.1 14.8    Platelets 150 - 400 K/uL 406 (H) 441 (H)

## 2016-02-24 NOTE — Telephone Encounter (Signed)
Patient agrees to appt at 330 today

## 2016-02-25 ENCOUNTER — Telehealth: Payer: Self-pay | Admitting: *Deleted

## 2016-02-25 LAB — SOLUBLE TRANSFERRIN RECEPTOR: Transferrin Receptor: 31.4 nmol/L — ABNORMAL HIGH (ref 12.2–27.3)

## 2016-02-25 LAB — HAPTOGLOBIN: HAPTOGLOBIN: 362 mg/dL — AB (ref 34–200)

## 2016-02-25 NOTE — Telephone Encounter (Signed)
RN received call from [place] in blood bank.  Blood bank calling to inquire why the extra hold blood bank tube was not received by blood bank. Boyd Kerbsenny states that she contacted the cancer center lab. Per Boyd KerbsPenny, the specimen was not drawn.  I spoke with Dorene GrebeNatalie in cancer center lab. B12 level and extra hold tube was not collected.  Dr. Donneta RombergBrahmanday informed. MD states that B12 can be added to next visit. MD agreed that extra hold tube is no longer needed. hgb yesterday was 10.1.

## 2016-02-27 ENCOUNTER — Telehealth: Payer: Self-pay | Admitting: *Deleted

## 2016-02-27 ENCOUNTER — Other Ambulatory Visit: Payer: Self-pay | Admitting: Internal Medicine

## 2016-02-27 NOTE — Telephone Encounter (Signed)
Contacted patient RN reviewed results with patient. IV iron will be needed next week. She will be contacted with an appointment time by our schedulers. Teach back process performed with patient.

## 2016-03-03 ENCOUNTER — Other Ambulatory Visit: Payer: Self-pay | Admitting: Internal Medicine

## 2016-03-03 ENCOUNTER — Inpatient Hospital Stay: Payer: Medicaid Other

## 2016-03-03 ENCOUNTER — Telehealth: Payer: Self-pay | Admitting: *Deleted

## 2016-03-03 ENCOUNTER — Other Ambulatory Visit: Payer: Self-pay | Admitting: *Deleted

## 2016-03-03 VITALS — BP 136/86 | HR 64 | Temp 98.5°F | Resp 18

## 2016-03-03 DIAGNOSIS — D508 Other iron deficiency anemias: Secondary | ICD-10-CM

## 2016-03-03 DIAGNOSIS — D509 Iron deficiency anemia, unspecified: Secondary | ICD-10-CM

## 2016-03-03 MED ORDER — SODIUM CHLORIDE 0.9 % IV SOLN
Freq: Once | INTRAVENOUS | Status: AC
  Administered 2016-03-03: 15:00:00 via INTRAVENOUS
  Filled 2016-03-03: qty 1000

## 2016-03-03 MED ORDER — SODIUM CHLORIDE 0.9 % IV SOLN
510.0000 mg | Freq: Once | INTRAVENOUS | Status: AC
  Administered 2016-03-03: 510 mg via INTRAVENOUS
  Filled 2016-03-03: qty 17

## 2016-03-03 NOTE — Telephone Encounter (Signed)
Spoke with Provider.  MD would like to see the patient in 2 months with possible IV iron.  Labs: cbc, iron students and ferritin needs to be drawn a few days prior to this apt.  [facility] to arrange these appointments.

## 2016-03-03 NOTE — Telephone Encounter (Signed)
-----   Message from Synetta Shadowolette C Dixon sent at 03/03/2016  4:04 PM EDT ----- Pt is schedule for June was told in hosp maybe she needs to f/u sooner like lab every month since her levels arent improving. What do you suggest

## 2016-03-05 ENCOUNTER — Other Ambulatory Visit: Payer: Self-pay | Admitting: Thoracic Surgery (Cardiothoracic Vascular Surgery)

## 2016-03-05 DIAGNOSIS — J851 Abscess of lung with pneumonia: Secondary | ICD-10-CM

## 2016-03-08 ENCOUNTER — Ambulatory Visit (INDEPENDENT_AMBULATORY_CARE_PROVIDER_SITE_OTHER): Payer: Medicaid Other | Admitting: Thoracic Surgery (Cardiothoracic Vascular Surgery)

## 2016-03-08 ENCOUNTER — Ambulatory Visit: Payer: Medicaid Other | Admitting: Thoracic Surgery (Cardiothoracic Vascular Surgery)

## 2016-03-08 ENCOUNTER — Encounter: Payer: Self-pay | Admitting: Thoracic Surgery (Cardiothoracic Vascular Surgery)

## 2016-03-08 ENCOUNTER — Ambulatory Visit
Admission: RE | Admit: 2016-03-08 | Discharge: 2016-03-08 | Disposition: A | Discharge status: Home / Self Care | Payer: Medicaid Other | Source: Ambulatory Visit | Attending: Thoracic Surgery (Cardiothoracic Vascular Surgery) | Admitting: Thoracic Surgery (Cardiothoracic Vascular Surgery)

## 2016-03-08 VITALS — BP 153/100 | HR 88 | Resp 20 | Ht 62.0 in | Wt 115.0 lb

## 2016-03-08 DIAGNOSIS — J851 Abscess of lung with pneumonia: Secondary | ICD-10-CM

## 2016-03-08 NOTE — Progress Notes (Signed)
[address]       [place] [address]             [phone]     CARDIOTHORACIC SURGERY OFFICE NOTE  Referring Provider is Hulda Marin, MD PCP is Danne Harbor, Letta Pate, MD   HPI:  Patient returns to the office today for follow-up of lung abscess.  She was originally transferred from [facility] to [facility] on 02/06/2016 with concerns that complex surgical intervention might be required for management of necrotizing pneumonia with lung abscess involving the lingular segments of the left upper lobe.  She underwent video bronchoscopy with endobronchial lavage and brushings on 02/08/2016. There was no sign of any endobronchial obstruction and copious purulent endobronchial secretions were released following passage of an endobronchial brush down the lingular segments of the left lung. She was subsequently treated with CT-guided placement of a large-bore chest tube into the abscess cavity and intravenous antibiotics. She did remarkably well and subsequent follow-up CT imaging revealed dramatic decrease in size in the abscess cavity. Eventually her tube was removed and she was discharged from the hospital on a six-week course of oral Augmentin. She returns to the office for routine follow-up today. She reports that she feels dramatically better. She states that her cough is almost completely gone. She still has a slight cough in the morning that occasionally will produce a small amount of old dark sputum, but overall the cough is dramatically better. She has occasional slight low-grade temperature ranging from 99-99.80F. Her appetite is improving. She has no shortness of breath. The pain in her chest has almost completely resolved. Overall she feels dramatically better than she did and she has made steady progress ever since she was discharged from the hospital.   Current Outpatient Prescriptions  Medication Sig Dispense Refill  . acetaminophen (TYLENOL) 500 MG  tablet Take 1,000 mg by mouth every 6 (six) hours as needed for mild pain, fever or headache.    Marland Kitchen amoxicillin-clavulanate (AUGMENTIN) 875-125 MG tablet Take 1 tablet by mouth 2 (two) times daily. 84 tablet 0  . calcium carbonate (TUMS - DOSED IN MG ELEMENTAL CALCIUM) 500 MG chewable tablet Chew 2 tablets by mouth as needed for indigestion or heartburn.    . Multiple Vitamin (MULTIVITAMIN WITH MINERALS) TABS tablet Take 1 tablet by mouth daily.    . ondansetron (ZOFRAN) 4 MG tablet Take 4 mg by mouth every 8 (eight) hours as needed for nausea or vomiting.     . sucralfate (CARAFATE) 1 GM/10ML suspension Take 10 mLs (1 g total) by mouth 3 (three) times daily with meals. 420 mL 0   No current facility-administered medications for this visit.      Physical Exam:   BP 153/100 mmHg  Pulse 88  Resp 20  Ht  (1.575 m)  Wt 115 lb (52.164 kg)  BMI 21.03 kg/m2  SpO2 98%  General:  Well-appearing  Chest:   Clear to auscultation  CV:   Regular rate and rhythm  Incisions:  n/a  Abdomen:  Soft nontender  Extremities:  Warm and well-perfused  Diagnostic Tests:  CHEST 2 VIEW  COMPARISON: No prior.  FINDINGS: Mediastinum and hilar structures are normal. Ill-defined density is in the left lower lobe. This would be consistent with the patient's history of lung abscess/pneumonia. Continued follow-up exams suggested to demonstrate clearing to exclude underlying persistent mass lesion. Elevation left hemidiaphragm noted. A component of pleural-parenchymal scarring in the left base  cannot be excluded. Heart size normal. Surgical clips right upper quadrant.  IMPRESSION: Ill-defined density in the left lower lobe consistent with the patient's history of lung abscess/pneumonia. A component of scarring may be present. Follow-up chest x-rays recommended to demonstrate resolution.  Electronically Signed: By: Maisie Fushomas Register On: 03/08/2016 14:24   Impression:  Patient is  clinically doing very well with continued improvement following percutaneous drainage of the lung abscess in the lingular segments of the left upper lobe. Chest x-ray performed today looks good with small expected area of residual opacity in the lingular region.  Plan:  The patient will complete her course of oral Augmentin which should last another 3 weeks. We will plan to have her return in 4 weeks with another follow-up chest x-ray performed at that time. She will call and return sooner should specific problems or difficulties arise. She has been encouraged to continue to increase her activity gradually. All of her questions have been addressed.  I spent in excess of 15 minutes during the conduct of this office consultation and >50% of this time involved direct face-to-face encounter with the patient for counseling and/or coordination of their care.  Salvatore Decentlarence H. Cornelius Moraswen, MD 03/08/2016 3:04 PM

## 2016-03-08 NOTE — Patient Instructions (Signed)
Continue to gradually increase your activity over time  Call should you develop increased fever, cough, shortness of breath or severe diarrhea

## 2016-03-23 LAB — ACID FAST CULTURE WITH REFLEXED SENSITIVITIES

## 2016-03-23 LAB — ACID FAST CULTURE WITH REFLEXED SENSITIVITIES (MYCOBACTERIA): Acid Fast Culture: NEGATIVE

## 2016-03-29 ENCOUNTER — Telehealth: Payer: Self-pay | Admitting: Cardiothoracic Surgery

## 2016-03-29 NOTE — Telephone Encounter (Signed)
Patient has called and would like to be seen in clinic with Dr Thelma Bargeaks. I have advised her to contact the cancer center to make the appointment.

## 2016-04-01 ENCOUNTER — Inpatient Hospital Stay: Payer: Medicaid Other | Admitting: Cardiothoracic Surgery

## 2016-04-02 ENCOUNTER — Encounter: Payer: Self-pay | Admitting: Cardiothoracic Surgery

## 2016-04-02 ENCOUNTER — Other Ambulatory Visit: Payer: Self-pay | Admitting: Thoracic Surgery (Cardiothoracic Vascular Surgery)

## 2016-04-02 ENCOUNTER — Ambulatory Visit (INDEPENDENT_AMBULATORY_CARE_PROVIDER_SITE_OTHER): Payer: Medicaid Other | Admitting: Cardiothoracic Surgery

## 2016-04-02 VITALS — BP 151/98 | HR 87 | Temp 97.5°F | Ht 62.0 in | Wt 106.6 lb

## 2016-04-02 DIAGNOSIS — J851 Abscess of lung with pneumonia: Secondary | ICD-10-CM | POA: Diagnosis not present

## 2016-04-02 DIAGNOSIS — J90 Pleural effusion, not elsewhere classified: Secondary | ICD-10-CM

## 2016-04-02 NOTE — Progress Notes (Deleted)
Subjective:     Patient ID: [place], female   DOB: Dec 23, 1970, 45 y.o.   MRN: [id number]  HPI   Review of Systems     Objective:   Physical Exam     Assessment:     ***    Plan:     ***

## 2016-04-02 NOTE — Patient Instructions (Signed)
We will schedule you for the Ct scan. We will call you and make an appointment to be seen after having the CT scan.

## 2016-04-02 NOTE — Progress Notes (Signed)
[facility] Post-Op Note  Patient ID: [place], female   DOB: 06/21/1971, 45 y.o.   MRN: [id number]  HISTORY: This patient returns today in follow-up. She was seen about a month ago at [facility] for a left upper lobe lung abscess. She was treated with a percutaneous drain and with oral Augmentin. She's done well but about 3-4 days ago began experiencing increasing pain on the left side and presented [facility] [place] [facility]. She complains of pain with sneezing or coughing. She also states that she's had night sweats but no fever. She did have a chest x-ray made at the current nodal clinic. I was able to independently review that in compared to her most recent chest x-ray done a Lennette BihariKohn. These were about a month apart in the left upper lobe lung abscess is improved.   Filed Vitals:   04/02/16 1123  BP: 151/98  Pulse: 87  Temp: 97.5 F (36.4 C)     EXAM: Resp: Lungs are clear bilaterally.  No respiratory distress, normal effort. Heart:  Regular without murmurs Abd:  Abdomen is soft, non distended and non tender. No masses are palpable.  There is no rebound and no guarding.  Neurological: Alert and oriented to person, place, and time. Coordination normal.  Skin: Skin is warm and dry. No rash noted. No diaphoretic. No erythema. No pallor.  Psychiatric: Normal mood and affect. Normal behavior. Judgment and thought content normal.    ASSESSMENT: Left upper lobe abscess status post percutaneous drainage. Overall she does appear to be improving   PLAN:   I had a long discussion with her regarding the need for follow-up. I will see her back again in 2 months time with a CT scan the chest. I will also counseled her regarding her weight and attempts at trying to gain some weight. She is looking into support groups here in [place] regarding her gastric bypass.    Hulda Marinimothy Adja Ruff, MD

## 2016-04-05 ENCOUNTER — Ambulatory Visit: Payer: Medicaid Other | Admitting: Thoracic Surgery (Cardiothoracic Vascular Surgery)

## 2016-04-06 ENCOUNTER — Telehealth: Payer: Self-pay | Admitting: Cardiothoracic Surgery

## 2016-04-06 NOTE — Telephone Encounter (Signed)
Patient returned phone call at this time. She verbalizes understanding that pain clinic is being contacted and she will be called with an appointment.

## 2016-04-06 NOTE — Telephone Encounter (Signed)
Spoke with Dr. Thelma Bargeaks regarding this patient. He would like referral sent to pain management.  Returned phone call to patient at this time to let her know that pain referral is being placed. No answer. Left voicemail stating above information and that she would be getting a call as soon as an appointment has been obtained.

## 2016-04-06 NOTE — Telephone Encounter (Signed)
When patient was here to see Dr Thelma Bargeaks on Friday for her follow up, she wanted to ask Dr Thelma Bargeaks about pain management and she forgot. She still has pain in her sides and it has been an issue in affecting her quality of life and she would like to know if he can help her or has some suggestions on what to do for pain management. Please call and advise.

## 2016-04-27 ENCOUNTER — Other Ambulatory Visit: Payer: Self-pay | Admitting: *Deleted

## 2016-04-27 ENCOUNTER — Inpatient Hospital Stay: Payer: Medicaid Other | Attending: Internal Medicine

## 2016-04-27 DIAGNOSIS — D509 Iron deficiency anemia, unspecified: Secondary | ICD-10-CM

## 2016-04-29 ENCOUNTER — Encounter: Payer: Self-pay | Admitting: *Deleted

## 2016-04-29 ENCOUNTER — Inpatient Hospital Stay: Payer: Medicaid Other

## 2016-04-29 ENCOUNTER — Inpatient Hospital Stay: Payer: Medicaid Other | Admitting: Internal Medicine

## 2016-05-06 ENCOUNTER — Encounter: Payer: Self-pay | Admitting: *Deleted

## 2016-05-19 ENCOUNTER — Ambulatory Visit (INDEPENDENT_AMBULATORY_CARE_PROVIDER_SITE_OTHER): Payer: Self-pay | Admitting: Psychiatry

## 2016-05-21 ENCOUNTER — Other Ambulatory Visit: Payer: Medicaid Other

## 2016-05-21 ENCOUNTER — Inpatient Hospital Stay: Payer: Medicaid Other | Attending: Internal Medicine

## 2016-05-21 DIAGNOSIS — D696 Thrombocytopenia, unspecified: Secondary | ICD-10-CM | POA: Insufficient documentation

## 2016-05-21 DIAGNOSIS — Z8719 Personal history of other diseases of the digestive system: Secondary | ICD-10-CM | POA: Insufficient documentation

## 2016-05-21 DIAGNOSIS — F418 Other specified anxiety disorders: Secondary | ICD-10-CM | POA: Insufficient documentation

## 2016-05-21 DIAGNOSIS — R5383 Other fatigue: Secondary | ICD-10-CM | POA: Insufficient documentation

## 2016-05-21 DIAGNOSIS — Z808 Family history of malignant neoplasm of other organs or systems: Secondary | ICD-10-CM | POA: Insufficient documentation

## 2016-05-21 DIAGNOSIS — K912 Postsurgical malabsorption, not elsewhere classified: Secondary | ICD-10-CM | POA: Insufficient documentation

## 2016-05-21 DIAGNOSIS — Z9884 Bariatric surgery status: Secondary | ICD-10-CM | POA: Insufficient documentation

## 2016-05-21 DIAGNOSIS — D509 Iron deficiency anemia, unspecified: Secondary | ICD-10-CM | POA: Insufficient documentation

## 2016-05-21 DIAGNOSIS — Z8701 Personal history of pneumonia (recurrent): Secondary | ICD-10-CM | POA: Insufficient documentation

## 2016-05-21 DIAGNOSIS — Z8711 Personal history of peptic ulcer disease: Secondary | ICD-10-CM | POA: Insufficient documentation

## 2016-05-21 DIAGNOSIS — Z803 Family history of malignant neoplasm of breast: Secondary | ICD-10-CM | POA: Insufficient documentation

## 2016-05-24 ENCOUNTER — Inpatient Hospital Stay: Payer: Medicaid Other | Admitting: Oncology

## 2016-05-24 ENCOUNTER — Inpatient Hospital Stay: Payer: Medicaid Other

## 2016-05-26 ENCOUNTER — Encounter: Payer: Self-pay | Admitting: *Deleted

## 2016-05-26 NOTE — Progress Notes (Signed)
Pt no showed for apts on 05/24/16 with Rosann Auerbachrish, NP.  RN spoke with Dr. Donneta RombergBrahmanday. Md states that this apt can be r/s to NP's schedule for next week. She has missed multiple appointments in cancer center. Should the pt no show for this next apt, she will receive a final letter for discharging pt in cancer center.

## 2016-05-28 ENCOUNTER — Ambulatory Visit: Payer: Medicaid Other | Admitting: Internal Medicine

## 2016-05-28 ENCOUNTER — Ambulatory Visit: Payer: Medicaid Other

## 2016-05-31 ENCOUNTER — Telehealth: Payer: Self-pay

## 2016-05-31 NOTE — Telephone Encounter (Signed)
Called patient and had to leave her a voicemail to remind her of her appointment to have her CT Scan on 06/04/2016 at 8:45 AM (fasting) and then following up with Dr. Thelma Bargeaks at 9:45 AM.

## 2016-06-02 ENCOUNTER — Ambulatory Visit: Payer: Medicaid Other

## 2016-06-03 ENCOUNTER — Other Ambulatory Visit: Payer: Self-pay

## 2016-06-03 DIAGNOSIS — R918 Other nonspecific abnormal finding of lung field: Secondary | ICD-10-CM

## 2016-06-04 ENCOUNTER — Ambulatory Visit
Admission: RE | Admit: 2016-06-04 | Discharge: 2016-06-04 | Disposition: A | Discharge status: Home / Self Care | Payer: Medicaid Other | Source: Ambulatory Visit | Attending: Cardiothoracic Surgery | Admitting: Cardiothoracic Surgery

## 2016-06-04 ENCOUNTER — Encounter: Payer: Self-pay | Admitting: Cardiothoracic Surgery

## 2016-06-04 ENCOUNTER — Ambulatory Visit (INDEPENDENT_AMBULATORY_CARE_PROVIDER_SITE_OTHER): Payer: Medicaid Other | Admitting: Cardiothoracic Surgery

## 2016-06-04 VITALS — BP 148/66 | HR 70 | Temp 98.3°F | Ht 62.0 in | Wt 115.0 lb

## 2016-06-04 DIAGNOSIS — J852 Abscess of lung without pneumonia: Secondary | ICD-10-CM | POA: Diagnosis not present

## 2016-06-04 DIAGNOSIS — R918 Other nonspecific abnormal finding of lung field: Secondary | ICD-10-CM | POA: Insufficient documentation

## 2016-06-04 DIAGNOSIS — J851 Abscess of lung with pneumonia: Secondary | ICD-10-CM | POA: Diagnosis not present

## 2016-06-04 MED ORDER — IOPAMIDOL (ISOVUE-300) INJECTION 61%
75.0000 mL | Freq: Once | INTRAVENOUS | Status: AC | PRN
Start: 2016-06-04 — End: 2016-06-04
  Administered 2016-06-04: 75 mL via INTRAVENOUS

## 2016-06-04 NOTE — Progress Notes (Signed)
[place] In[facility] Post-Op Note  Patient ID: Ferdinand Langoshley L Fretz, female   DOB: 16-Jan-1971, 45 y.o.   MRN: [id number]  HISTORY: This patient is a 45 year old woman who was admitted to our hospital back in February of this year with a left lung abscess. This was treated with percutaneous drainage. She comes in today with follow-up of her left upper lobe abscess. She states she has had occasional episodes of weakness and early fatigue. She's had no further weight loss although she is unable to gain any significant amounts of weight. He does not complain of any shortness of breath or fever.   Filed Vitals:   06/04/16 1046  BP: 148/66  Pulse: 70  Temp: 98.3 F (36.8 C)     EXAM: Resp: Lungs are clear bilaterally.  No respiratory distress, normal effort. Heart:  Regular without murmurs Abd:  Abdomen is soft, non distended and non tender. No masses are palpable.  There is no rebound and no guarding.  Neurological: Alert and oriented to person, place, and time. Coordination normal.  Skin: Skin is warm and dry. No rash noted. No diaphoretic. No erythema. No pallor.  Psychiatric: Normal mood and affect. Normal behavior. Judgment and thought content normal.    ASSESSMENT: I have independently reviewed the patient's CT scan from today. There is almost complete resolution of the left lung abscess. There is no other pathology identified.   PLAN:   Since the patient is essentially asymptomatic at this point we did not make any further follow-up for. We did encourage her to continue her follow-up with Dr. Orlie DakinFinnegan for her iron deficiency anemia. I did review with her the results of the CT scan which demonstrated no other significant pathologic findings. She will return to see us as needed.    Hulda Marinimothy Bill Yohn, MD

## 2016-06-07 ENCOUNTER — Inpatient Hospital Stay: Payer: Medicaid Other

## 2016-06-07 ENCOUNTER — Other Ambulatory Visit: Payer: Medicaid Other

## 2016-06-07 ENCOUNTER — Inpatient Hospital Stay (HOSPITAL_BASED_OUTPATIENT_CLINIC_OR_DEPARTMENT_OTHER): Payer: Medicaid Other | Admitting: Internal Medicine

## 2016-06-07 ENCOUNTER — Encounter: Payer: Self-pay | Admitting: Oncology

## 2016-06-07 VITALS — BP 136/78 | HR 75 | Temp 97.4°F | Resp 18 | Wt 117.8 lb

## 2016-06-07 DIAGNOSIS — Z8711 Personal history of peptic ulcer disease: Secondary | ICD-10-CM | POA: Diagnosis not present

## 2016-06-07 DIAGNOSIS — F418 Other specified anxiety disorders: Secondary | ICD-10-CM | POA: Diagnosis not present

## 2016-06-07 DIAGNOSIS — D696 Thrombocytopenia, unspecified: Secondary | ICD-10-CM | POA: Diagnosis not present

## 2016-06-07 DIAGNOSIS — D509 Iron deficiency anemia, unspecified: Secondary | ICD-10-CM | POA: Diagnosis not present

## 2016-06-07 DIAGNOSIS — Z9884 Bariatric surgery status: Secondary | ICD-10-CM | POA: Diagnosis not present

## 2016-06-07 DIAGNOSIS — R5383 Other fatigue: Secondary | ICD-10-CM | POA: Diagnosis not present

## 2016-06-07 DIAGNOSIS — Z8701 Personal history of pneumonia (recurrent): Secondary | ICD-10-CM

## 2016-06-07 DIAGNOSIS — Z8719 Personal history of other diseases of the digestive system: Secondary | ICD-10-CM | POA: Diagnosis not present

## 2016-06-07 DIAGNOSIS — Z803 Family history of malignant neoplasm of breast: Secondary | ICD-10-CM | POA: Diagnosis not present

## 2016-06-07 DIAGNOSIS — K912 Postsurgical malabsorption, not elsewhere classified: Secondary | ICD-10-CM

## 2016-06-07 DIAGNOSIS — Z808 Family history of malignant neoplasm of other organs or systems: Secondary | ICD-10-CM

## 2016-06-07 LAB — CBC WITH DIFFERENTIAL/PLATELET
Basophils Absolute: 0.1 10*3/uL (ref 0–0.1)
Basophils Relative: 1 %
EOS ABS: 0 10*3/uL (ref 0–0.7)
EOS PCT: 1 %
HCT: 39.5 % (ref 35.0–47.0)
HEMOGLOBIN: 13.6 g/dL (ref 12.0–16.0)
LYMPHS ABS: 1.8 10*3/uL (ref 1.0–3.6)
LYMPHS PCT: 23 %
MCH: 28.5 pg (ref 26.0–34.0)
MCHC: 34.4 g/dL (ref 32.0–36.0)
MCV: 83 fL (ref 80.0–100.0)
MONOS PCT: 5 %
Monocytes Absolute: 0.4 10*3/uL (ref 0.2–0.9)
Neutro Abs: 5.6 10*3/uL (ref 1.4–6.5)
Neutrophils Relative %: 70 %
Platelets: 338 10*3/uL (ref 150–440)
RBC: 4.76 MIL/uL (ref 3.80–5.20)
RDW: 14.3 % (ref 11.5–14.5)
WBC: 7.8 10*3/uL (ref 3.6–11.0)

## 2016-06-07 LAB — IRON AND TIBC
Iron: 72 ug/dL (ref 28–170)
SATURATION RATIOS: 21 % (ref 10.4–31.8)
TIBC: 349 ug/dL (ref 250–450)
UIBC: 277 ug/dL

## 2016-06-07 LAB — FERRITIN: FERRITIN: 210 ng/mL (ref 11–307)

## 2016-06-07 NOTE — Progress Notes (Signed)
States feels fatigued today which contributes it to recent pneumonia, lung abscess.

## 2016-06-07 NOTE — Progress Notes (Signed)
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   INTERVAL HISTORY:  In the interim patient was admitted to the hospital for pneumonia/lung abscess had to be drained. Currently resolved.  Continues to have fatigue.   Denies any blood in stools black stools. She does not take B12 pills. Patient last had Feraheme in March 2017.   REVIEW OF SYSTEMS:  A complete 10 point review of system is done which is negative except mentioned above/history of present illness.   PAST MEDICAL HISTORY :  Past Medical History  Diagnosis Date  . Anxiety   . Depression   . Thrombocytopenia (HCC)   . Gastric ulcer   . Iron deficiency anemia   . 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 Malnutrition following gastrointestinal surgery 10/22/2014  . 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 Lung abscess (HCC) 02/03/2016  . CAP (community acquired pneumonia) 01/21/2016  . Protein-calorie malnutrition (HCC)   . Abscess of left lung with pneumonia (HCC)   . Last menstrual period (LMP) > 10 days ago     had uterine ablation, last cycle >5 years ago    PAST SURGICAL HISTORY :   Past Surgical History  Procedure Laterality Date  . Cesarean section    . Cholecystectomy    . Appendectomy    . Gastric bypass  2011  . Colon surgery      resection of sigmoid volvulus  . Video bronchoscopy N/A 02/08/2016    Procedure: VIDEO BRONCHOSCOPY WITH ENDOBRONCHIAL LAVAGE;  Surgeon: Purcell Nailslarence H Owen, MD;  [facility];  Service: Thoracic;  Laterality: N/A;  . Uterine ablation      FAMILY HISTORY :   Family History  Problem Relation Age of Onset  . Breast cancer Mother   . Hypertension Father   . Prostate cancer Father   . Hypertension Brother   . Anxiety  disorder Brother     SOCIAL HISTORY:   Social History  Substance Use Topics  . Smoking status: Never Smoker   . Smokeless tobacco: Never Used  . Alcohol Use: No    ALLERGIES:  is allergic to phenobarbital and sulfa antibiotics.  MEDICATIONS:  Current Outpatient Prescriptions  Medication Sig Dispense Refill  . acetaminophen (TYLENOL) 500 MG tablet Take 1,000 mg by mouth every 6 (six) hours as needed for mild pain, fever or headache.    . Multiple Vitamin (MULTIVITAMIN WITH MINERALS) TABS tablet Take 1 tablet by mouth daily.     No current facility-administered medications for this visit.    PHYSICAL EXAMINATION:   BP 136/78 mmHg  Pulse 75  Temp(Src) 97.4 F (36.3 C) (Tympanic)  Resp 18  Wt 117 lb 13.4 oz (53.45 kg)  Filed Weights   06/07/16 1423  Weight: 117 lb 13.4 oz (53.45 kg)    GENERAL: moderately nourished moderately built Caucasian female patient. She is alone. EYES: no pallor or icterus OROPHARYNX: no thrush or ulceration; good dentition  NECK: supple, no masses felt LYMPH:  no palpable lymphadenopathy in the cervical, axillary or inguinal regions LUNGS: clear to auscultation and  No wheeze or crackles HEART/CVS: regular rate & rhythm and no murmurs; No lower extremity edema ABDOMEN:abdomen soft, non-tender and normal bowel sounds Musculoskeletal:no cyanosis of digits and no clubbing  PSYCH: alert & oriented x 3 with fluent speech NEURO: no focal motor/sensory deficits SKIN:  no rashes or significant lesions  LABORATORY DATA:  I have reviewed the data as listed    Component Value Date/Time   NA 145 02/18/2016 0520   NA 140 12/03/2014 1459   K 3.4* 02/18/2016 0520   K 3.7 12/03/2014 1459   CL 110 02/18/2016 0520   CL 105 12/03/2014 1459   CO2 27 02/18/2016 0520   CO2 24 12/03/2014 1459   GLUCOSE 82 02/18/2016 0520   GLUCOSE 85 12/03/2014 1459   BUN <5* 02/18/2016 0520   BUN 2* 12/03/2014 1459   CREATININE 0.92 02/18/2016 0520   CREATININE 0.74  12/03/2014 1459   CALCIUM 8.6* 02/18/2016 0520   CALCIUM 7.9* 12/03/2014 1459   PROT 5.8* 02/07/2016 0300   PROT 5.9* 12/03/2014 1459   ALBUMIN 1.9* 02/07/2016 0300   ALBUMIN 2.7* 12/03/2014 1459   AST 16 02/07/2016 0300   AST 29 12/03/2014 1459   ALT 11* 02/07/2016 0300   ALT 22 12/03/2014 1459   ALKPHOS 96 02/07/2016 0300   ALKPHOS 91 12/03/2014 1459   BILITOT 0.5 02/07/2016 0300   BILITOT 0.2 12/03/2014 1459   GFRNONAA >60 02/18/2016 0520   GFRNONAA >60 12/03/2014 1459   GFRAA >60 02/18/2016 0520   GFRAA >60 12/03/2014 1459    No results found for: SPEP, UPEP  Lab Results  Component Value Date   WBC 7.3 02/24/2016   NEUTROABS 5.8 02/24/2016   HGB 10.2* 02/24/2016   HCT 31.5* 02/24/2016   MCV 82.4 02/24/2016   PLT 448* 02/24/2016      Chemistry      Component Value Date/Time   NA 145 02/18/2016 0520   NA 140 12/03/2014 1459   K 3.4* 02/18/2016 0520   K 3.7 12/03/2014 1459   CL 110 02/18/2016 0520   CL 105 12/03/2014 1459   CO2 27 02/18/2016 0520   CO2 24 12/03/2014 1459   BUN <5* 02/18/2016 0520   BUN 2* 12/03/2014 1459   CREATININE 0.92 02/18/2016 0520   CREATININE 0.74 12/03/2014 1459      Component Value Date/Time   CALCIUM 8.6* 02/18/2016 0520   CALCIUM 7.9* 12/03/2014 1459   ALKPHOS 96 02/07/2016 0300   ALKPHOS 91 12/03/2014 1459   AST 16 02/07/2016 0300   AST 29 12/03/2014 1459   ALT 11* 02/07/2016 0300   ALT 22 12/03/2014 1459   BILITOT 0.5 02/07/2016 0300   BILITOT 0.2 12/03/2014 1459       RADIOGRAPHIC STUDIES: I have personally reviewed the radiological images as listed and agreed with the findings in the report. No results found.   ASSESSMENT & PLAN: Iron defieicny Anemia- secondary gastric bypass. Last status post Feraheme in March 2017. We will repeat the labs CBC ferritin iron studies today. If low recommend IV Feraheme again.  #  patient follow-up with NP in approximately 4 months/labs/ Feraheme.     Summer CoderGovinda R Brahmanday,  MD 06/07/2016 2:34 PM

## 2016-06-08 ENCOUNTER — Telehealth: Payer: Self-pay | Admitting: *Deleted

## 2016-06-08 NOTE — Telephone Encounter (Signed)
Attempted to call patient - no answer and no availability to leave message.

## 2016-06-08 NOTE — Telephone Encounter (Signed)
-----   Message from Earna CoderGovinda R Brahmanday, MD sent at 06/07/2016  5:58 PM EDT ----- Please inform pt that she does NOT need IV iron; labs/Normal; repeat Labs in 4 months as planned- Thx

## 2016-06-10 NOTE — Telephone Encounter (Signed)
Attempted to call patient.  No answer and no availability to leave message.

## 2016-06-18 ENCOUNTER — Telehealth: Payer: Self-pay | Admitting: *Deleted

## 2016-06-18 NOTE — Telephone Encounter (Signed)
Pt called cancer center back to get iron results.  I explained to patient that Synetta Failnita, RN made a call attempt to reach her with these results on 06/08/16.  She states that she did not get this msg. "I never answer my phone msgs as I am always working." Results provided. I explained to patient that no IV iron infusion is needed at this time. Md will recheck her labs in October. Teach back process performed.

## 2016-07-07 IMAGING — DX DG CHEST 2V
2 series · 2 of 2 positions shown · non-contrast
Comparison: 02/03/2016 CT

CLINICAL DATA: Atelectasis, lung abscess, pneumonia

EXAM:
CHEST  2 VIEW

[chest pa]
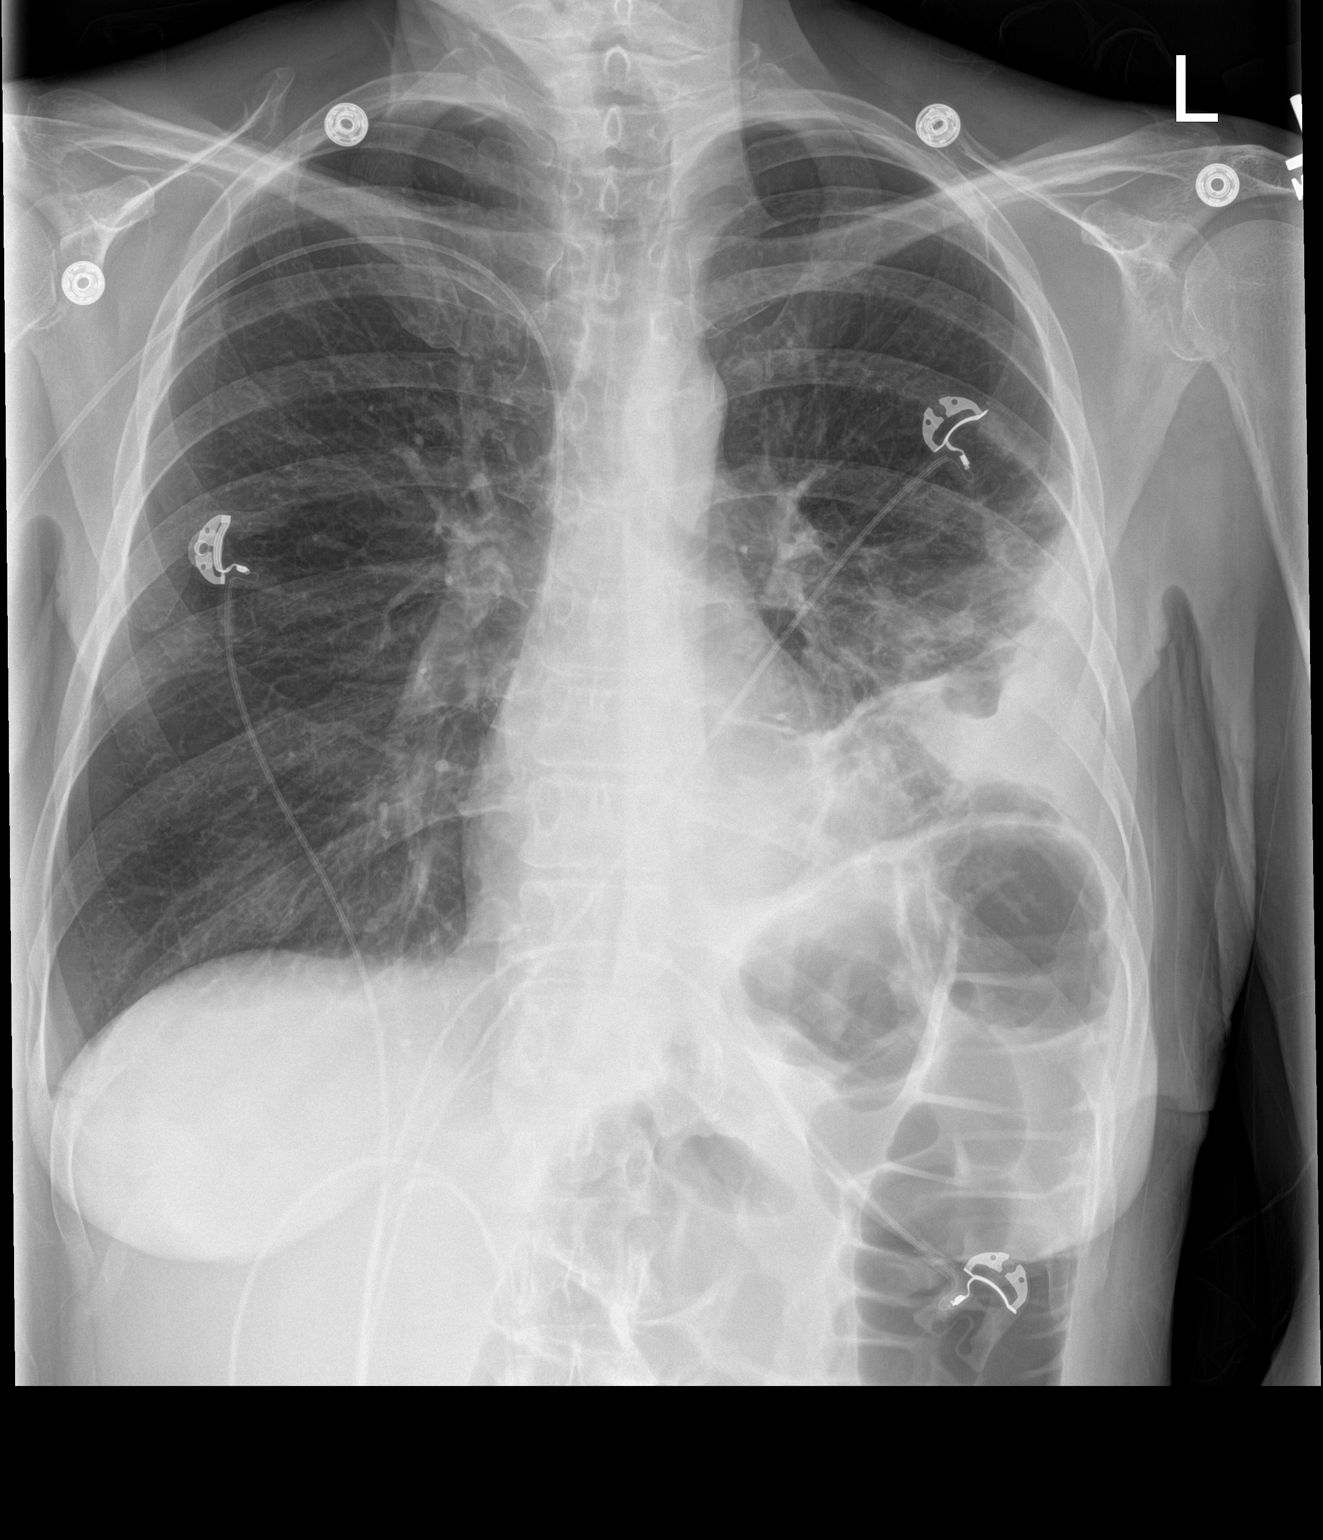

[chest lat]
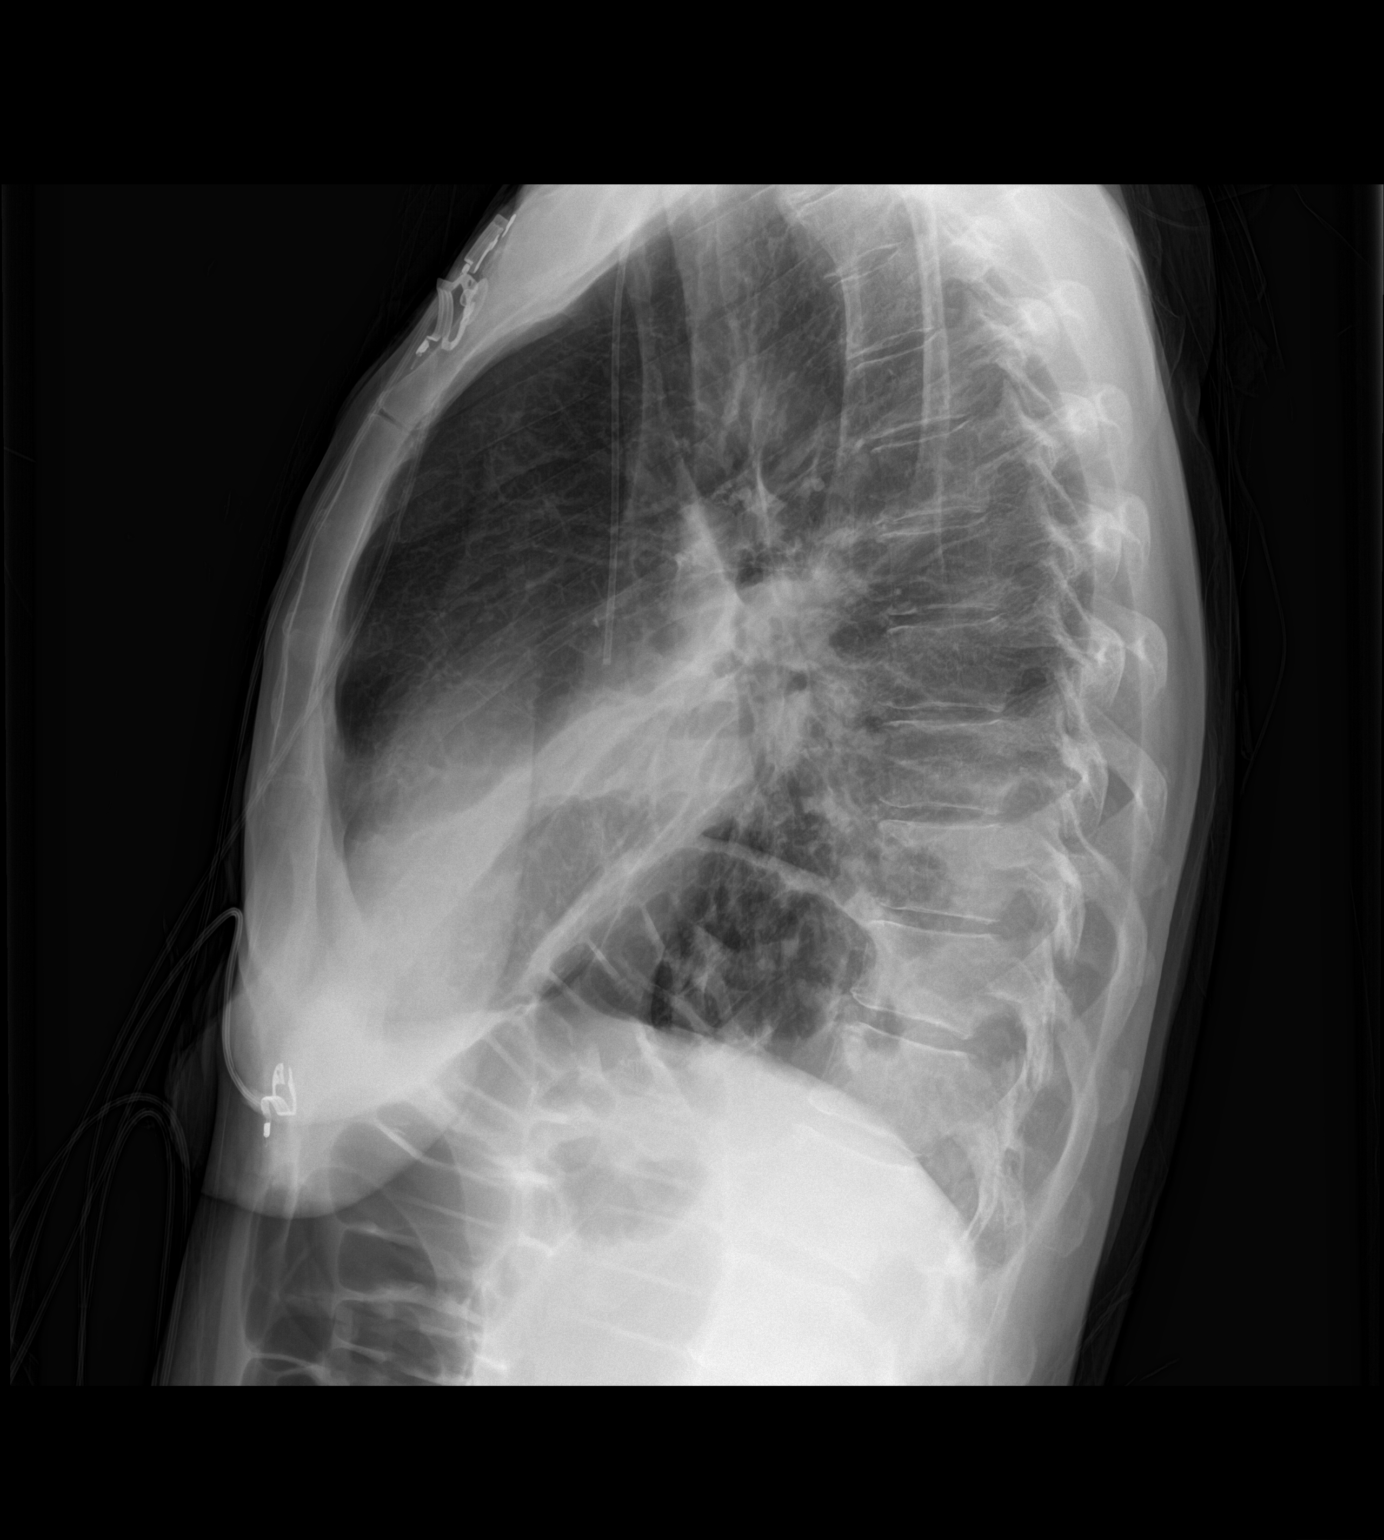

[2 of 2 positions shown; findings below may reference images not displayed]

FINDINGS: Left pleural effusion with left basilar airspace opacity not
significantly changed. Right lung is clear. Right PICC line is in
place with the tip in the SVC. Heart is normal size.
IMPRESSION: Continued left pleural effusion and left basilar airspace disease,
not significantly changed.

## 2016-07-17 IMAGING — DX DG CHEST 2V
2 series · 2 of 2 positions shown · non-contrast
Comparison: CT 02/16/2016.  Chest x-ray 02/12/2016.

CLINICAL DATA: Lung abscess.  Pneumonia.

EXAM:
CHEST  2 VIEW

[chest pa]
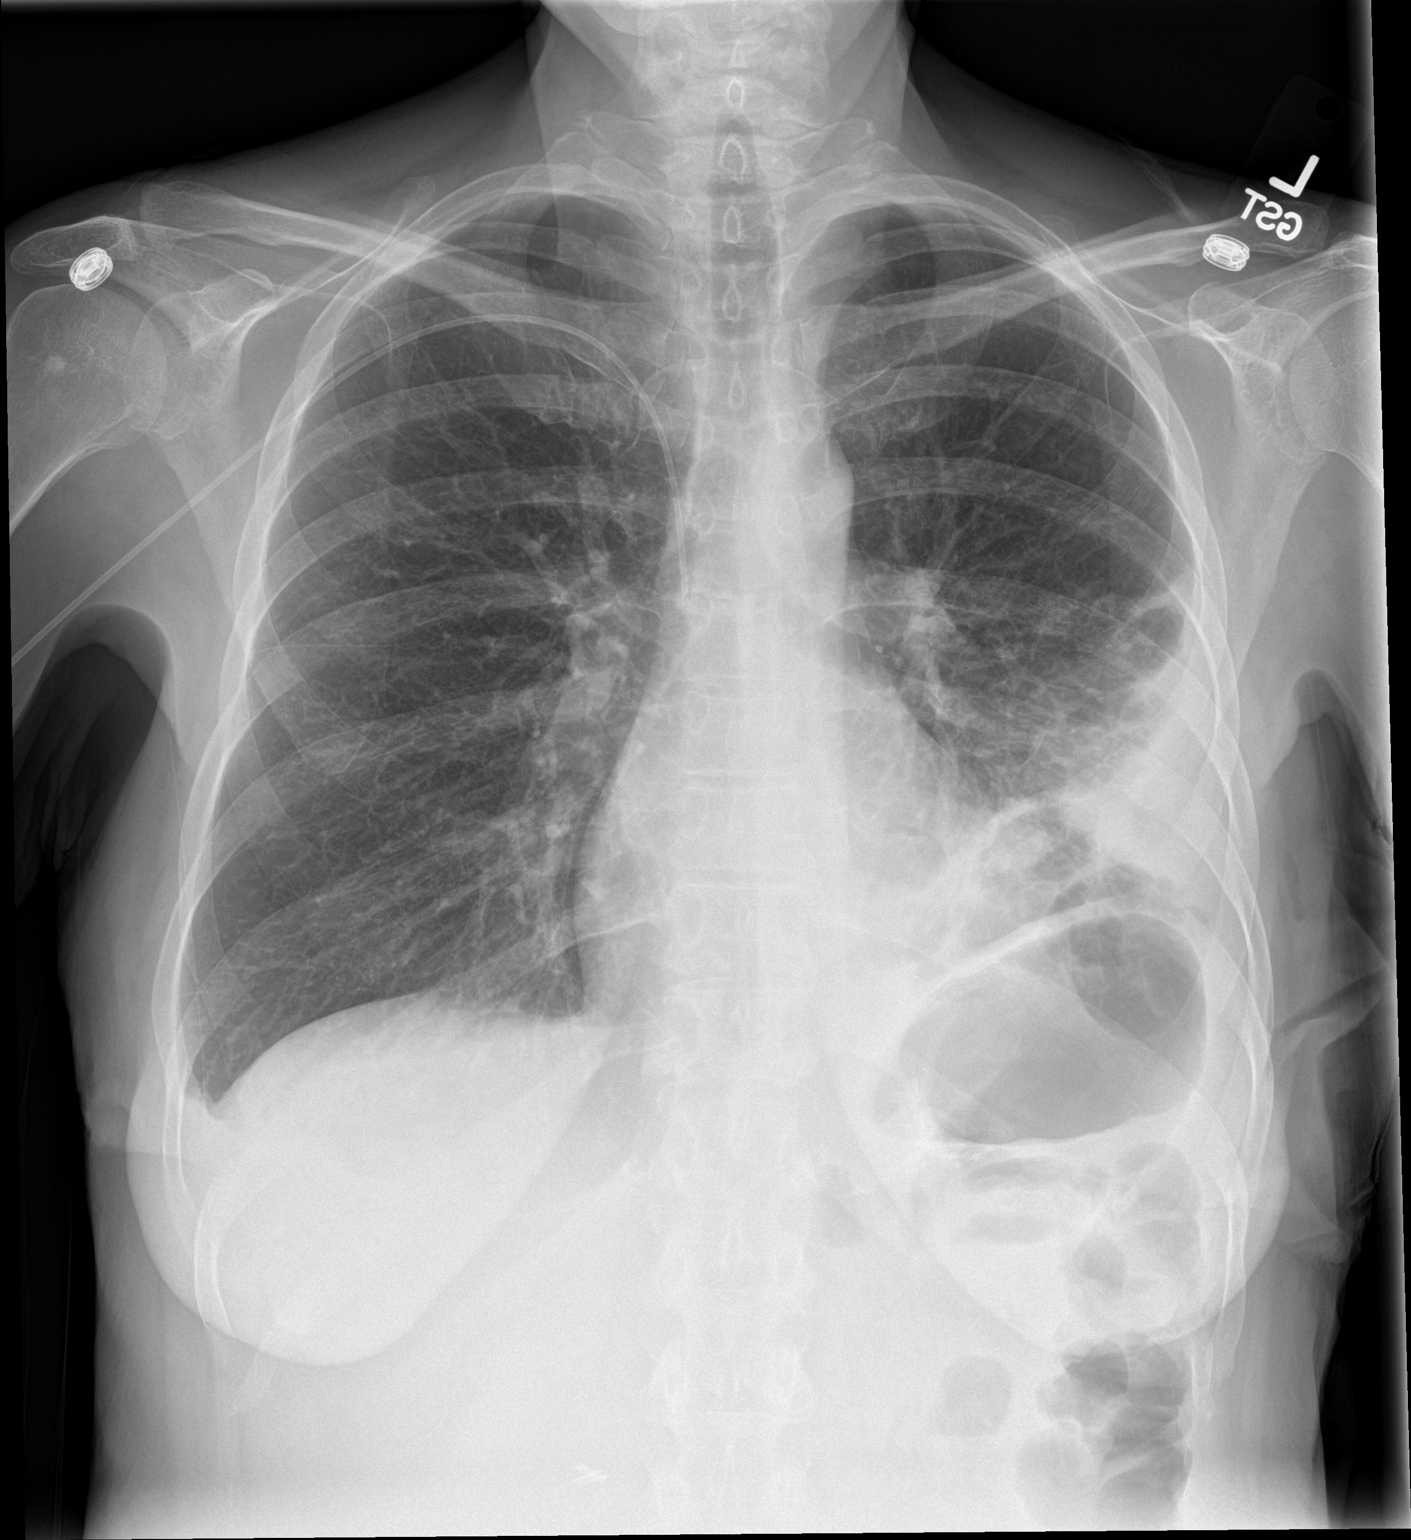

[chest lat]
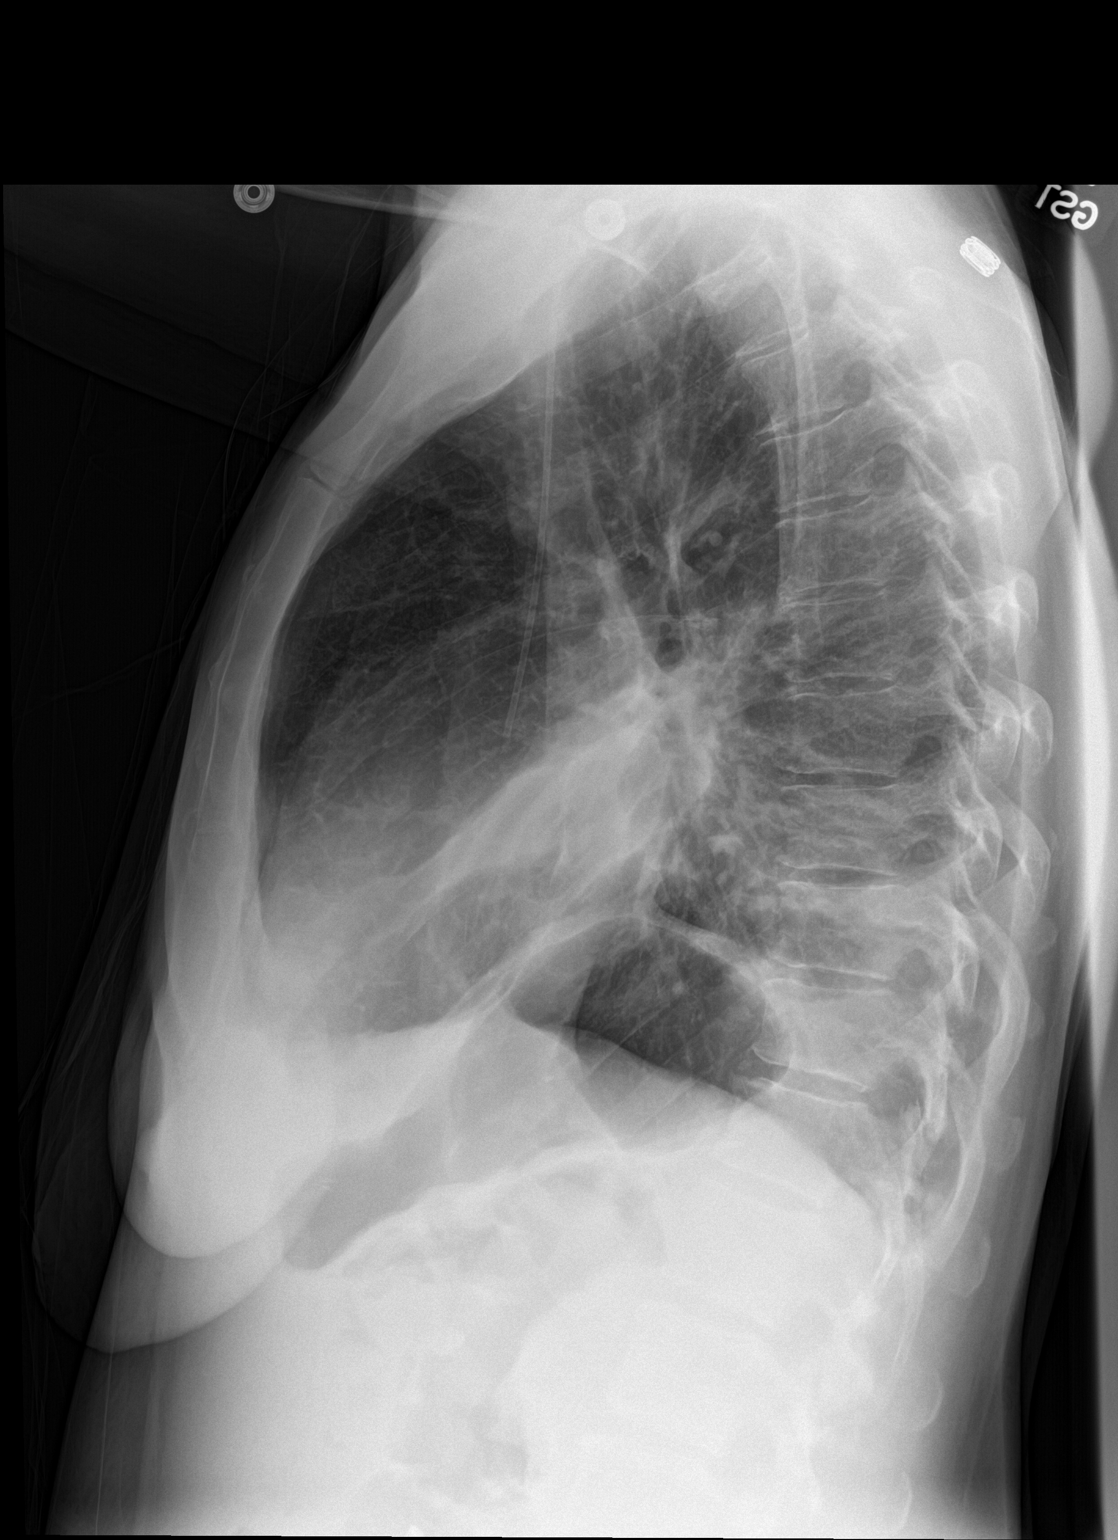

[2 of 2 positions shown; findings below may reference images not displayed]

FINDINGS: Interim removal of left chest drainage catheter. Right PICC line
noted with tip projected superior vena cava. Mediastinum hilar
structures normal. Heart size normal. Persistent but decreased in
size in left left base fluid collection/abscess. Persistent small
left pleural effusion. No significant pneumothorax.
IMPRESSION: 1. Interim removal of left chest drainage catheter. Left lung base
fluid collection/abscess has decreased in size. No significant
pneumothorax.
2. Persistent small left pleural effusion.
3. Right PICC line in stable position.

## 2016-08-17 ENCOUNTER — Emergency Department
Admission: EM | Admit: 2016-08-17 | Discharge: 2016-08-17 | Disposition: A | Discharge status: Home / Self Care | Payer: Medicaid Other | Attending: Emergency Medicine | Admitting: Emergency Medicine

## 2016-08-17 ENCOUNTER — Emergency Department: Payer: Medicaid Other

## 2016-08-17 ENCOUNTER — Encounter: Payer: Self-pay | Admitting: Emergency Medicine

## 2016-08-17 DIAGNOSIS — R079 Chest pain, unspecified: Secondary | ICD-10-CM | POA: Diagnosis not present

## 2016-08-17 DIAGNOSIS — R61 Generalized hyperhidrosis: Secondary | ICD-10-CM | POA: Diagnosis not present

## 2016-08-17 DIAGNOSIS — Z791 Long term (current) use of non-steroidal anti-inflammatories (NSAID): Secondary | ICD-10-CM | POA: Diagnosis not present

## 2016-08-17 LAB — COMPREHENSIVE METABOLIC PANEL
ALBUMIN: 4 g/dL (ref 3.5–5.0)
ALK PHOS: 76 U/L (ref 38–126)
ALT: 15 U/L (ref 14–54)
AST: 23 U/L (ref 15–41)
Anion gap: 5 (ref 5–15)
BILIRUBIN TOTAL: 0.3 mg/dL (ref 0.3–1.2)
BUN: 6 mg/dL (ref 6–20)
CO2: 27 mmol/L (ref 22–32)
Calcium: 9.7 mg/dL (ref 8.9–10.3)
Chloride: 107 mmol/L (ref 101–111)
Creatinine, Ser: 0.55 mg/dL (ref 0.44–1.00)
GFR calc Af Amer: >60 mL/min (ref >60)
GFR calc non Af Amer: >60 mL/min (ref >60)
GLUCOSE: 105 mg/dL — AB (ref 65–99)
POTASSIUM: 3.6 mmol/L (ref 3.5–5.1)
SODIUM: 139 mmol/L (ref 135–145)
TOTAL PROTEIN: 7.2 g/dL (ref 6.5–8.1)

## 2016-08-17 LAB — CBC WITH DIFFERENTIAL/PLATELET
BASOS ABS: 0 10*3/uL (ref 0–0.1)
BASOS PCT: 1 %
EOS ABS: 0 10*3/uL (ref 0–0.7)
Eosinophils Relative: 0 %
HEMATOCRIT: 38.7 % (ref 35.0–47.0)
HEMOGLOBIN: 13.3 g/dL (ref 12.0–16.0)
Lymphocytes Relative: 9 %
Lymphs Abs: 0.6 10*3/uL — ABNORMAL LOW (ref 1.0–3.6)
MCH: 29.9 pg (ref 26.0–34.0)
MCHC: 34.5 g/dL (ref 32.0–36.0)
MCV: 86.8 fL (ref 80.0–100.0)
Monocytes Absolute: 0.4 10*3/uL (ref 0.2–0.9)
Monocytes Relative: 5 %
NEUTROS ABS: 5.8 10*3/uL (ref 1.4–6.5)
NEUTROS PCT: 85 %
Platelets: 304 10*3/uL (ref 150–440)
RBC: 4.46 MIL/uL (ref 3.80–5.20)
RDW: 13.3 % (ref 11.5–14.5)
WBC: 6.8 10*3/uL (ref 3.6–11.0)

## 2016-08-17 LAB — TROPONIN I: Troponin I: <0.03 ng/mL (ref <0.03)

## 2016-08-17 NOTE — ED Notes (Signed)
Pt taken to xray prior to this RN assessing pt. NAD.  Care assumed by Erskine SquibbJane, RN.

## 2016-08-17 NOTE — ED Triage Notes (Signed)
Pt reports left lower lung pain; pt was admitted recently with lung abscess in march. Pt reports pain with movement, coughing, night sweats and weakness.

## 2016-08-17 NOTE — ED Notes (Signed)
AAOx3.  Skin warm and dry. Ambulates with easy and steady gait.  Posture upright and relaxed. No SOB/ DOE.

## 2016-08-17 NOTE — ED Provider Notes (Signed)
[facility] Emergency Department Provider Note   ____________________________________________    I have reviewed the triage vital signs and the nursing notes.   HISTORY  Chief Complaint Chest Pain     HPI Summer Howard is a 45 y.o. female who presents with concerns for left-sided chest pain. Patient reports a history of pneumonia with an abscess of the left lung which required chest tube drainage. She has recovered fully from this but over the last several days she started to have similar pains and had an episode of night sweats which made her concerned that this recurred. She denies fevers, no cough, no shortness of breath. No recent travel. No calf pain or swelling. No pleurisy.   Past Medical History:  Diagnosis Date  . Abscess of left lung with pneumonia (HCC)   . Anxiety   . CAP (community acquired pneumonia) 01/21/2016  . 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 Depression   . Gastric ulcer   . Iron deficiency anemia   . Last menstrual period (LMP) > 10 days ago    had uterine ablation, last cycle >5 years ago  . Lung abscess (HCC) 02/03/2016  . Malnutrition following gastrointestinal surgery 10/22/2014  . 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 Protein-calorie malnutrition (HCC)   . 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 Thrombocytopenia [facility](HCC)     Patient Active Problem List   Diagnosis Date Noted  . Abscess of left lung with pneumonia (HCC)   . Protein-calorie malnutrition, severe 02/15/2016  . Protein-calorie malnutrition (HCC)   . Necrotizing pneumonia (HCC) 02/06/2016  . Pleural effusion 02/06/2016  . Hemoptysis 02/06/2016  . Iron deficiency anemia   . Lung abscess (HCC) 02/03/2016  . Chest pain with low risk for cardiac etiology 01/21/2016  . CAP (community acquired pneumonia) 01/21/2016  . Pneumonia 01/20/2016  . Pleuritic chest pain 01/20/2016  . Absolute anemia 10/22/2014  . Chronic  gastrojejunal ulcer 10/22/2014  . Malnutrition following gastrointestinal surgery 10/22/2014  . 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 Gastroduodenal ulcer 07/31/2014  . Other specified postprocedural states 07/31/2014  . Anxiety, generalized 03/07/2014  . Small bowel obstruction (HCC) 04/02/2013  . Fatty infiltration of liver 02/25/2010    Past Surgical History:  Procedure Laterality Date  . APPENDECTOMY    . CESAREAN SECTION    . CHOLECYSTECTOMY    . COLON SURGERY     resection of sigmoid volvulus  . GASTRIC BYPASS  2011  . uterine ablation    . VIDEO BRONCHOSCOPY N/A 02/08/2016   Procedure: VIDEO BRONCHOSCOPY WITH ENDOBRONCHIAL LAVAGE;  Surgeon: Purcell Nailslarence H Owen, MD;  Location: [facility] OR;  Service: Thoracic;  Laterality: N/A;    Prior to Admission medications   Medication Sig Start Date End Date Taking? Authorizing Provider  acetaminophen (TYLENOL) 500 MG tablet Take 1,000 mg by mouth every 6 (six) hours as needed for mild pain, fever or headache.    Historical Provider, MD  Multiple Vitamin (MULTIVITAMIN WITH MINERALS) TABS tablet Take 1 tablet by mouth daily.    Historical Provider, MD     Allergies Phenobarbital and Sulfa antibiotics  Family History  Problem Relation Age of Onset  . Breast cancer Mother   . Hypertension Father   . Prostate cancer Father   . Hypertension Brother   . Anxiety disorder Brother     Social History Social History  Substance Use Topics  . Smoking status: Never Smoker  .  Smokeless tobacco: Never Used  . Alcohol use No    Review of Systems  Constitutional: No chills   Cardiovascular: As above Respiratory: Denies shortness of breath. No cough Gastrointestinal:  No nausea, no vomiting.    Musculoskeletal: Negative for back pain. Skin: Negative for rash.   10-point ROS otherwise negative.  ____________________________________________   PHYSICAL EXAM:  VITAL SIGNS:   Enc Vitals Group      BP (!) 165/107     Pulse Rate 89     Resp 16     Temp 98.3 F (36.8 C)     Temp Source Oral     SpO2 100 %     Weight 120 lb (54.4 kg)     Height 5\' 2"  (1.575 m)     Head Circumference      Peak Flow      Pain Score 10     Pain Loc      Pain Edu?      Excl. in GC?     Constitutional: Alert and oriented. No acute distress. Pleasant and interactive Eyes: Conjunctivae are normal.  Head: Atraumatic. Nose: No congestion/rhinnorhea. Mouth/Throat: Mucous membranes are moist.    Cardiovascular: Normal rate, regular rhythm. Grossly normal heart sounds.  Good peripheral circulation. No chest wall tenderness to palpation Respiratory: Normal respiratory effort.  No retractions. Lungs CTAB. Gastrointestinal: Soft and nontender. No distention.  No CVA tenderness. Genitourinary: deferred Musculoskeletal: No lower extremity tenderness nor edema.  Warm and well perfused Neurologic:  Normal speech and language. No gross focal neurologic deficits are appreciated.  Skin:  Skin is warm, dry and intact. No rash noted. Psychiatric: Mood and affect are normal. Speech and behavior are normal.  ____________________________________________   LABS (all labs ordered are listed, but only abnormal results are displayed)  Labs Reviewed  CBC WITH DIFFERENTIAL/PLATELET - Abnormal; Notable for the following:       Result Value   Lymphs Abs 0.6 (*)    All other components within normal limits  COMPREHENSIVE METABOLIC PANEL - Abnormal; Notable for the following:    Glucose, Bld 105 (*)    All other components within normal limits  TROPONIN I   ____________________________________________  EKG  ED ECG REPORT I, Jene Every, the attending physician, personally viewed and interpreted this ECG.  Date: 08/17/2016 EKG Time: 10:20 AM Rate: 76 Rhythm: normal sinus rhythm QRS Axis: normal Intervals: normal ST/T Wave abnormalities: normal Conduction Disturbances: none Narrative Interpretation:  unremarkable  ____________________________________________  RADIOLOGY  Chest x-ray unremarkable ____________________________________________   PROCEDURES  Procedure(s) performed: No    Critical Care performed: No ____________________________________________   INITIAL IMPRESSION / ASSESSMENT AND PLAN / ED COURSE  Pertinent labs & imaging results that were available during my care of the patient were reviewed by me and considered in my medical decision making (see chart for details).  Given patient's history, chest x-ray, labs performed. Her chest x-ray is reassuring lab work is normal. Patient is greatly reassured by this. Her presentation is not consistent with ACS and a troponin and EKG are normal. Recommend follow-up with her CT surgeon as needed. Return precautions discussed.  Clinical Course  Value Comment By Time  Hemoglobin: 13.3 (Reviewed) Jene Every, MD 09/05 1206   ____________________________________________   FINAL CLINICAL IMPRESSION(S) / ED DIAGNOSES  Final diagnoses:  Nonspecific chest pain      NEW MEDICATIONS STARTED DURING THIS VISIT:  New Prescriptions   No medications on file     Note:  This document  was prepared using Conservation officer, historic buildings and may include unintentional dictation errors.    Jene Every, MD 08/17/16 [phone]

## 2016-10-05 ENCOUNTER — Other Ambulatory Visit: Payer: Medicaid Other

## 2016-10-07 ENCOUNTER — Ambulatory Visit: Payer: Medicaid Other

## 2016-10-07 ENCOUNTER — Encounter: Payer: Self-pay | Admitting: Psychiatry

## 2016-10-07 ENCOUNTER — Ambulatory Visit (INDEPENDENT_AMBULATORY_CARE_PROVIDER_SITE_OTHER): Payer: Medicaid Other | Admitting: Psychiatry

## 2016-10-07 ENCOUNTER — Other Ambulatory Visit: Payer: Medicaid Other

## 2016-10-07 VITALS — BP 128/80 | HR 73 | Temp 97.6°F | Wt 144.4 lb

## 2016-10-07 DIAGNOSIS — F411 Generalized anxiety disorder: Secondary | ICD-10-CM

## 2016-10-07 DIAGNOSIS — F4323 Adjustment disorder with mixed anxiety and depressed mood: Secondary | ICD-10-CM | POA: Diagnosis not present

## 2016-10-07 MED ORDER — FLUOXETINE HCL 40 MG PO CAPS: 40.0000 mg | ORAL_CAPSULE | Freq: Every morning | ORAL | 1 refills | Status: DC

## 2016-10-07 MED ORDER — TRAZODONE HCL 100 MG PO TABS: 100.0000 mg | ORAL_TABLET | Freq: Every day | ORAL | 0 refills | Status: DC

## 2016-10-07 MED ORDER — HYDROXYZINE PAMOATE 25 MG PO CAPS: 25.0000 mg | ORAL_CAPSULE | Freq: Three times a day (TID) | ORAL | 0 refills | Status: DC

## 2016-10-07 NOTE — Progress Notes (Signed)
[facility] MD/PA/NP OP Progress Note  10/07/2016 12:33 PM Summer Howard  MRN:  [id number]  Subjective:  Patient is a 45 year old widowed female who presented for follow-up appointment. She was last seen in December. She reported that her husband passed away 2 years ago and she is currently living with her 3 children. She reported that she was not taking any medications in the past and has recently started taking medications including Prozac Vistaril and trazodone. She reported that she has severe anxiety and is looking for a job at this time. She stated that the medications are helping her but she continues to feel anxious. She was previously following Dr. Mayford Knife in the past. Patient reported that she wants her medications to be adjusted at this time. She appeared somewhat apprehensive during the interview. She currently denied having any suicidal homicidal ideations or plans.    Chief Complaint:  Chief Complaint    Follow-up; Medication Refill     Visit Diagnosis:     ICD-9-CM ICD-10-CM   1. Adjustment disorder with mixed anxiety and depressed mood 309.28 F43.23   2. GAD (generalized anxiety disorder) 300.02 F41.1     Past Medical History:  Past Medical History:  Diagnosis Date  . Abscess of left lung with pneumonia (HCC)   . Anxiety   . CAP (community acquired pneumonia) 01/21/2016  . 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 Depression   . Gastric ulcer   . Iron deficiency anemia   . Last menstrual period (LMP) > 10 days ago    had uterine ablation, last cycle >5 years ago  . Lung abscess (HCC) 02/03/2016  . Malnutrition following gastrointestinal surgery 10/22/2014  . 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 Protein-calorie malnutrition (HCC)   . 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 Thrombocytopenia (HCC)     Past Surgical History:  Procedure Laterality Date  . APPENDECTOMY    . CESAREAN SECTION    . CHOLECYSTECTOMY    . COLON SURGERY      resection of sigmoid volvulus  . GASTRIC BYPASS  2011  . uterine ablation    . VIDEO BRONCHOSCOPY N/A 02/08/2016   Procedure: VIDEO BRONCHOSCOPY WITH ENDOBRONCHIAL LAVAGE;  Surgeon: Purcell Nails, MD;  [facility];  Service: Thoracic;  Laterality: N/A;   Family History:  Family History  Problem Relation Age of Onset  . Breast cancer Mother   . Hypertension Father   . Prostate cancer Father   . Hypertension Brother   . Anxiety disorder Brother    Social History:  Social History   Social History  . Marital status: Widowed    Spouse name: N/A  . Number of children: N/A  . Years of education: N/A   Social History Main Topics  . Smoking status: Never Smoker  . Smokeless tobacco: Never Used  . Alcohol use No  . Drug use: No  . Sexual activity: No   Other Topics Concern  . None   Social History Narrative  . None   Additional History:   Assessment:   Musculoskeletal: Strength & Muscle Tone: within normal limits Gait & Station: normal Patient leans: N/A  Psychiatric Specialty Exam: HPI  Review of Systems  Psychiatric/Behavioral: Negative for depression, hallucinations, memory loss, substance abuse and suicidal ideas. The patient is nervous/anxious and has insomnia.   All other systems reviewed and are negative.   Blood pressure 128/80, pulse 73, temperature 97.6 F (36.4 C), temperature source Oral, weight 144 lb 6.4  oz (65.5 kg).Body mass index is 26.41 kg/m.  General Appearance: Neat and Well Groomed  Eye Contact:  Good  Speech:  Normal Rate  Volume:  Normal  Mood:  Anxious  Affect:  Congruent  Thought Process:  Linear and Logical  Orientation:  Full (Time, Place, and Person)  Thought Content:  Negative  Suicidal Thoughts:  No  Homicidal Thoughts:  No  Memory:  Immediate;   Good Recent;   Good Remote;   Good  Judgement:  Good  Insight:  Good  Psychomotor Activity:  Negative  Concentration:  Good  Recall:  Good  Fund of Knowledge: Good   Language: Good  Akathisia:  Negative  Handed:  Right unknown   AIMS (if indicated):  N/A  Assets:  Desire for Improvement Physical Health Vocational/Educational  ADL's:  Intact  Cognition: WNL  Sleep:  fair   Is the patient at risk to self?  No. Has the patient been a risk to self in the past 6 months?  No. Has the patient been a risk to self within the distant past?  No. Is the patient a risk to others?  No. Has the patient been a risk to others in the past 6 months?  No. Has the patient been a risk to others within the distant past?  No.  Current Medications: Current Outpatient Prescriptions  Medication Sig Dispense Refill  . acetaminophen (TYLENOL) 500 MG tablet Take 1,000 mg by mouth every 6 (six) hours as needed for mild pain, fever or headache.    Marland Kitchen. FLUoxetine (PROZAC) 40 MG capsule Take 1 capsule (40 mg total) by mouth every morning. 30 capsule 1  . hydrOXYzine (VISTARIL) 25 MG capsule Take 1 capsule (25 mg total) by mouth 3 (three) times daily. 30 capsule 0  . Multiple Vitamin (MULTIVITAMIN WITH MINERALS) TABS tablet Take 1 tablet by mouth daily.    . traZODone (DESYREL) 100 MG tablet Take 1 tablet (100 mg total) by mouth at bedtime. 30 tablet 0   No current facility-administered medications for this visit.     Medical Decision Making:  Review of Medication Regimen & Side Effects (2) and Review of New Medication or Change in Dosage (2)  Treatment Plan Summary:Medication management and Plan   Continue Prozac and I will titrate the dose 40 mg every morning Continue Vistaril 25 mg 3 times a day Increase  trazodone 100 mg by mouth daily at bedtime  Discussed with patient about her medications and she demonstrated understanding  Follow-up in 2 weeks or earlier depending on her symptoms   More than 50% of the time spent in psychoeducation, counseling and coordination of care.    This note was generated in part or whole with voice recognition software. Voice regonition  is usually quite accurate but there are transcription errors that can and very often do occur. I apologize for any typographical errors that were not detected and corrected.   Brandy HaleUzma Merry Pond 10/07/2016, 12:33 PM

## 2016-10-08 ENCOUNTER — Encounter (INDEPENDENT_AMBULATORY_CARE_PROVIDER_SITE_OTHER): Payer: Self-pay

## 2016-10-08 ENCOUNTER — Inpatient Hospital Stay: Payer: Medicaid Other | Attending: Internal Medicine

## 2016-10-08 DIAGNOSIS — D696 Thrombocytopenia, unspecified: Secondary | ICD-10-CM | POA: Diagnosis not present

## 2016-10-08 DIAGNOSIS — N920 Excessive and frequent menstruation with regular cycle: Secondary | ICD-10-CM | POA: Insufficient documentation

## 2016-10-08 DIAGNOSIS — Z8701 Personal history of pneumonia (recurrent): Secondary | ICD-10-CM | POA: Insufficient documentation

## 2016-10-08 DIAGNOSIS — Z803 Family history of malignant neoplasm of breast: Secondary | ICD-10-CM | POA: Insufficient documentation

## 2016-10-08 DIAGNOSIS — E46 Unspecified protein-calorie malnutrition: Secondary | ICD-10-CM | POA: Diagnosis not present

## 2016-10-08 DIAGNOSIS — D509 Iron deficiency anemia, unspecified: Secondary | ICD-10-CM | POA: Diagnosis present

## 2016-10-08 DIAGNOSIS — F329 Major depressive disorder, single episode, unspecified: Secondary | ICD-10-CM | POA: Insufficient documentation

## 2016-10-08 DIAGNOSIS — Z8042 Family history of malignant neoplasm of prostate: Secondary | ICD-10-CM | POA: Insufficient documentation

## 2016-10-08 DIAGNOSIS — Z8719 Personal history of other diseases of the digestive system: Secondary | ICD-10-CM | POA: Diagnosis not present

## 2016-10-08 DIAGNOSIS — Z9884 Bariatric surgery status: Secondary | ICD-10-CM | POA: Insufficient documentation

## 2016-10-08 DIAGNOSIS — Z8711 Personal history of peptic ulcer disease: Secondary | ICD-10-CM | POA: Insufficient documentation

## 2016-10-08 DIAGNOSIS — F419 Anxiety disorder, unspecified: Secondary | ICD-10-CM | POA: Diagnosis not present

## 2016-10-08 DIAGNOSIS — Z9049 Acquired absence of other specified parts of digestive tract: Secondary | ICD-10-CM | POA: Insufficient documentation

## 2016-10-08 LAB — CBC WITH DIFFERENTIAL/PLATELET
Basophils Absolute: 0.1 10*3/uL (ref 0–0.1)
Basophils Relative: 1 %
Eosinophils Absolute: 0.2 10*3/uL (ref 0–0.7)
Eosinophils Relative: 3 %
HEMATOCRIT: 40.8 % (ref 35.0–47.0)
HEMOGLOBIN: 13.8 g/dL (ref 12.0–16.0)
LYMPHS ABS: 2 10*3/uL (ref 1.0–3.6)
LYMPHS PCT: 28 %
MCH: 29.4 pg (ref 26.0–34.0)
MCHC: 33.8 g/dL (ref 32.0–36.0)
MCV: 86.9 fL (ref 80.0–100.0)
MONOS PCT: 5 %
Monocytes Absolute: 0.3 10*3/uL (ref 0.2–0.9)
NEUTROS ABS: 4.5 10*3/uL (ref 1.4–6.5)
NEUTROS PCT: 63 %
Platelets: 297 10*3/uL (ref 150–440)
RBC: 4.7 MIL/uL (ref 3.80–5.20)
RDW: 12.8 % (ref 11.5–14.5)
WBC: 7.2 10*3/uL (ref 3.6–11.0)

## 2016-10-08 LAB — IRON AND TIBC
Iron: 68 ug/dL (ref 28–170)
Saturation Ratios: 17 % (ref 10.4–31.8)
TIBC: 405 ug/dL (ref 250–450)
UIBC: 337 ug/dL

## 2016-10-08 LAB — FERRITIN: Ferritin: 170 ng/mL (ref 11–307)

## 2016-10-12 ENCOUNTER — Inpatient Hospital Stay (HOSPITAL_BASED_OUTPATIENT_CLINIC_OR_DEPARTMENT_OTHER): Payer: Medicaid Other | Admitting: Internal Medicine

## 2016-10-12 ENCOUNTER — Inpatient Hospital Stay: Payer: Medicaid Other

## 2016-10-12 VITALS — BP 109/72 | HR 67 | Temp 97.2°F | Wt 143.7 lb

## 2016-10-12 DIAGNOSIS — Z9884 Bariatric surgery status: Secondary | ICD-10-CM

## 2016-10-12 DIAGNOSIS — F419 Anxiety disorder, unspecified: Secondary | ICD-10-CM

## 2016-10-12 DIAGNOSIS — F329 Major depressive disorder, single episode, unspecified: Secondary | ICD-10-CM

## 2016-10-12 DIAGNOSIS — N921 Excessive and frequent menstruation with irregular cycle: Secondary | ICD-10-CM

## 2016-10-12 DIAGNOSIS — Z8701 Personal history of pneumonia (recurrent): Secondary | ICD-10-CM

## 2016-10-12 DIAGNOSIS — Z9049 Acquired absence of other specified parts of digestive tract: Secondary | ICD-10-CM

## 2016-10-12 DIAGNOSIS — N920 Excessive and frequent menstruation with regular cycle: Secondary | ICD-10-CM

## 2016-10-12 DIAGNOSIS — D509 Iron deficiency anemia, unspecified: Secondary | ICD-10-CM | POA: Diagnosis not present

## 2016-10-12 DIAGNOSIS — D500 Iron deficiency anemia secondary to blood loss (chronic): Secondary | ICD-10-CM

## 2016-10-12 DIAGNOSIS — Z8719 Personal history of other diseases of the digestive system: Secondary | ICD-10-CM

## 2016-10-12 DIAGNOSIS — Z8711 Personal history of peptic ulcer disease: Secondary | ICD-10-CM

## 2016-10-12 DIAGNOSIS — Z803 Family history of malignant neoplasm of breast: Secondary | ICD-10-CM

## 2016-10-12 DIAGNOSIS — D696 Thrombocytopenia, unspecified: Secondary | ICD-10-CM

## 2016-10-12 DIAGNOSIS — E46 Unspecified protein-calorie malnutrition: Secondary | ICD-10-CM

## 2016-10-12 DIAGNOSIS — Z8042 Family history of malignant neoplasm of prostate: Secondary | ICD-10-CM

## 2016-10-12 NOTE — Progress Notes (Signed)
[facility]  Telephone:(336) [phone] [phone]) [phone]  ID: Summer LangoAshley L Howard OB: [birth year]  MR#: [id number]  AOZ#:[id number]#:[id number]  Patient Care Team: Rafael BihariJohn B Walker III, MD as PCP - General (Internal Medicine)  CHIEF COMPLAINT:  Follow-up for iron deficiency anemia related to gastric bypass (November 2011)  HPI: She's been receiving IV iron under care of Dr. Donneta RombergBrahmanday here at the cancer Center. Her last Duncan DullFeraheme was given in March 2017. Patient has tolerated oral Iron ooorly in the past.   She also reports menorrhagia that has worsened recently she has a prior history of fibroid uterus. She has not been feeling excessively tired, no melena, no heartburn, no weight loss, no change in diet or bowel habits   REVIEW OF SYSTEMS:   Review of Systems  Constitutional: Negative for chills, fever and malaise/fatigue.  Genitourinary:       Menorrhagia  Neurological: Negative for weakness.  All other systems reviewed and are negative.   As per HPI. Otherwise, a complete review of systems is negative.  PAST MEDICAL HISTORY: Past Medical History:  Diagnosis Date  . Abscess of left lung with pneumonia (HCC)   . Anxiety   . CAP (community acquired pneumonia) 01/21/2016  . 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 Depression   . Gastric ulcer   . Iron deficiency anemia   . Last menstrual period (LMP) > 10 days ago    had uterine ablation, last cycle >5 years ago  . Lung abscess (HCC) 02/03/2016  . Malnutrition following gastrointestinal surgery 10/22/2014  . 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 Protein-calorie malnutrition (HCC)   . 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 Thrombocytopenia (HCC)     PAST SURGICAL HISTORY: Past Surgical History:  Procedure Laterality Date  . APPENDECTOMY    . CESAREAN SECTION    . CHOLECYSTECTOMY    . COLON SURGERY     resection of sigmoid volvulus  . GASTRIC BYPASS  2011  . uterine ablation     . VIDEO BRONCHOSCOPY N/A 02/08/2016   Procedure: VIDEO BRONCHOSCOPY WITH ENDOBRONCHIAL LAVAGE;  Surgeon: Purcell Nailslarence H Owen, MD;  [facility];  Service: Thoracic;  Laterality: N/A;    FAMILY HISTORY: Family History  Problem Relation Age of Onset  . Breast cancer Mother   . Hypertension Father   . Prostate cancer Father   . Hypertension Brother   . Anxiety disorder Brother     Allergies  Allergen Reactions  . Phenobarbital Rash  . Sulfa Antibiotics Rash    Vitals:   10/12/16 1351  BP: 109/72  Pulse: 67  Temp: 97.2 F (36.2 C)     Body mass index is 26.29 kg/m.   1.69 meters squared    Physical Exam  Constitutional: She is oriented to person, place, and time. She appears well-developed and well-nourished.  HENT:  Head: Normocephalic and atraumatic.  Eyes: EOM are normal. Pupils are equal, round, and reactive to light.  Neck: Normal range of motion. No thyromegaly present.  Cardiovascular: Normal rate and regular rhythm.   Pulmonary/Chest: Effort normal. No respiratory distress.  Abdominal: Soft.  Musculoskeletal: Normal range of motion. She exhibits no edema or deformity.  Lymphadenopathy:    She has no cervical adenopathy.  Neurological: She is alert and oriented to person, place, and time.  Skin: Skin is warm.  Psychiatric: She has a normal mood and affect. Her behavior is normal. Judgment normal.  Nursing note and vitals reviewed.  Results review ( External  and Internal):  Her CBC from 10/27 shows a normal hemoglobin 13.8 white cell count 7.2 platelets 297 this is significant improvement from at 10.2 back in March 2017.  And her iron panel shows an improved iron saturation to 17% Iserum iron at 68 and ferritin at 170  LAB RESULTS:  Lab Results  Component Value Date   NA 139 08/17/2016   K 3.6 08/17/2016   CL 107 08/17/2016   CO2 27 08/17/2016   GLUCOSE 105 (H) 08/17/2016   BUN 6 08/17/2016   CREATININE 0.55 08/17/2016   CALCIUM 9.7 08/17/2016    PROT 7.2 08/17/2016   ALBUMIN 4.0 08/17/2016   AST 23 08/17/2016   ALT 15 08/17/2016   ALKPHOS 76 08/17/2016   BILITOT 0.3 08/17/2016   GFRNONAA >60 08/17/2016   GFRAA >60 08/17/2016    Lab Results  Component Value Date   WBC 7.2 10/08/2016   NEUTROABS 4.5 10/08/2016   HGB 13.8 10/08/2016   HCT 40.8 10/08/2016   MCV 86.9 10/08/2016   PLT 297 10/08/2016     Impression and plan: Iron deficiency anemia appears to be improved since  IV infusion. however compared to last visit and now there is been a slight decline in her serum ferritin and saturation levels even though  they're still within normal range.  I advised to continue to observe without IV infusion at this time.  Patient has tolerated oral Iron poorly in the past.   However she also reports menorrhagia that has worsened recently she has a prior history of fibroid uterus. [place]  Has since lost follow up with her GYN   I recommended that she reestablish GYN care here locally a referral has been given.   Return in about 3 months (around 01/12/2017).   Patient expressed understanding and was in agreement with this plan. She also understands that She can call clinic at any time with any questions, concerns, or complaints.   ------------------------------------------------------------------------------------------------------------------------------  This note was generated in part with voice recognition software and I apologize for any typographical errors that were not detected and corrected.    Summer BadgerSirisha Neil Errickson, MD   10/28/2016 1:12 PM

## 2016-10-13 ENCOUNTER — Telehealth: Payer: Self-pay | Admitting: *Deleted

## 2016-10-13 NOTE — Telephone Encounter (Signed)
Patient's step mother, Stevie KernCarol Story, [phone], reports patient is concerned about not receiving iron infusion yesterday. She reports that in the past Dr. Sherrlyn HockPandit felt that it was beneficial to continue to have the infusion as a maintenance therapy. She would like to have Dr. Sharlette DenseBrahmanday's opinion, who she has seen in the past.

## 2016-10-14 NOTE — Telephone Encounter (Signed)
I reviewed the recent labs; agree with Dr. FijiPeru- that pt does not need IV iron now; but needs re-eval in 3 months for possible need for IV iron.  Please inform pt.

## 2016-10-14 NOTE — Telephone Encounter (Signed)
Notified of Dr. Sharlette DenseBrahmanday's opinion. Satisfied with information. Thankful for attention to their question.

## 2016-11-01 ENCOUNTER — Other Ambulatory Visit: Payer: Self-pay | Admitting: Psychiatry

## 2016-11-09 ENCOUNTER — Ambulatory Visit (INDEPENDENT_AMBULATORY_CARE_PROVIDER_SITE_OTHER): Payer: Medicaid Other | Admitting: Licensed Clinical Social Worker

## 2016-11-09 ENCOUNTER — Ambulatory Visit (INDEPENDENT_AMBULATORY_CARE_PROVIDER_SITE_OTHER): Payer: Medicaid Other | Admitting: Psychiatry

## 2016-11-09 VITALS — BP 123/81 | HR 65 | Temp 98.4°F | Resp 16 | Wt 143.6 lb

## 2016-11-09 DIAGNOSIS — F411 Generalized anxiety disorder: Secondary | ICD-10-CM

## 2016-11-09 DIAGNOSIS — F4323 Adjustment disorder with mixed anxiety and depressed mood: Secondary | ICD-10-CM

## 2016-11-09 MED ORDER — BUSPIRONE HCL 10 MG PO TABS: 10.0000 mg | ORAL_TABLET | Freq: Two times a day (BID) | ORAL | 1 refills | Status: AC

## 2016-11-09 MED ORDER — TRAZODONE HCL 100 MG PO TABS: 100.0000 mg | ORAL_TABLET | Freq: Every day | ORAL | 0 refills | Status: AC

## 2016-11-09 MED ORDER — FLUOXETINE HCL 40 MG PO CAPS: 40.0000 mg | ORAL_CAPSULE | Freq: Every morning | ORAL | 1 refills | Status: AC

## 2016-11-09 NOTE — Progress Notes (Signed)
BH MD/PA/NP OP Progress Note  11/09/2016 3:08 PM Summer Howard  MRN:  [id number]  Subjective:  Patient is a 45 year old widowed female who presented for follow-up appointment.  She reported that her husband passed away 2 years ago and she is currently living with her 3 teenage children. She reported that she is spending the Thanksgiving with them and her father and stepmother. She reported that she was working over the holiday. Patient reported that she continues to feel anxious and is overprotective of children. She reported that the Prozac is helping her. Patient reported that she wants to have the medication adjusted as she continues to have anxiety. We discussed about her medications in detail. Patient agreed with the plan to discontinue the Vistaril and start the BuSpar at this time. She is also open to suggestion about changing the Prozac later  She currently denied having any suicidal homicidal ideations or plans. She appeared calm and cooperative during the interview.  She currently denied having any suicidal homicidal ideations or plans.    Chief Complaint:  Chief Complaint    Follow-up     Visit Diagnosis:     ICD-9-CM ICD-10-CM   1. Adjustment disorder with mixed anxiety and depressed mood 309.28 F43.23   2. GAD (generalized anxiety disorder) 300.02 F41.1     Past Medical History:  Past Medical History:  Diagnosis Date  . Abscess of left lung with pneumonia (HCC)   . Anxiety   . CAP (community acquired pneumonia) 01/21/2016  . 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 Depression   . Gastric ulcer   . Iron deficiency anemia   . Last menstrual period (LMP) > 10 days ago    had uterine ablation, last cycle >5 years ago  . Lung abscess (HCC) 02/03/2016  . Malnutrition following gastrointestinal surgery 10/22/2014  . 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 Protein-calorie malnutrition (HCC)   . 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  Thrombocytopenia (HCC)     Past Surgical History:  Procedure Laterality Date  . APPENDECTOMY    . CESAREAN SECTION    . CHOLECYSTECTOMY    . COLON SURGERY     resection of sigmoid volvulus  . GASTRIC BYPASS  2011  . uterine ablation    . VIDEO BRONCHOSCOPY N/A 02/08/2016   Procedure: VIDEO BRONCHOSCOPY WITH ENDOBRONCHIAL LAVAGE;  Surgeon: Purcell Nailslarence H Owen, MD;  [facility];  Service: Thoracic;  Laterality: N/A;   Family History:  Family History  Problem Relation Age of Onset  . Breast cancer Mother   . Hypertension Father   . Prostate cancer Father   . Hypertension Brother   . Anxiety disorder Brother    Social History:  Social History   Social History  . Marital status: Widowed    Spouse name: N/A  . Number of children: N/A  . Years of education: N/A   Social History Main Topics  . Smoking status: Never Smoker  . Smokeless tobacco: Never Used  . Alcohol use No  . Drug use: No  . Sexual activity: No   Other Topics Concern  . Not on file   Social History Narrative  . No narrative on file   Additional History:   Assessment:   Musculoskeletal: Strength & Muscle Tone: within normal limits Gait & Station: normal Patient leans: N/A  Psychiatric Specialty Exam: HPI  Review of Systems  Psychiatric/Behavioral: Negative for depression, hallucinations, memory loss, substance abuse and suicidal ideas. The patient is  nervous/anxious and has insomnia.   All other systems reviewed and are negative.   Blood pressure 123/81, pulse 65, temperature 98.4 F (36.9 C), temperature source Tympanic, resp. rate 16, weight 143 lb 9.6 oz (65.1 kg), SpO2 99 %.Body mass index is 26.26 kg/m.  General Appearance: Neat and Well Groomed  Eye Contact:  Good  Speech:  Normal Rate  Volume:  Normal  Mood:  Anxious  Affect:  Congruent  Thought Process:  Linear and Logical  Orientation:  Full (Time, Place, and Person)  Thought Content:  Negative  Suicidal Thoughts:  No  Homicidal  Thoughts:  No  Memory:  Immediate;   Good Recent;   Good Remote;   Good  Judgement:  Good  Insight:  Good  Psychomotor Activity:  Negative  Concentration:  Good  Recall:  Good  Fund of Knowledge: Good  Language: Good  Akathisia:  Negative  Handed:  Right unknown   AIMS (if indicated):  N/A  Assets:  Desire for Improvement Physical Health Vocational/Educational  ADL's:  Intact  Cognition: WNL  Sleep:  fair   Is the patient at risk to self?  No. Has the patient been a risk to self in the past 6 months?  No. Has the patient been a risk to self within the distant past?  No. Is the patient a risk to others?  No. Has the patient been a risk to others in the past 6 months?  No. Has the patient been a risk to others within the distant past?  No.  Current Medications: Current Outpatient Prescriptions  Medication Sig Dispense Refill  . acetaminophen (TYLENOL) 500 MG tablet Take 1,000 mg by mouth every 6 (six) hours as needed for mild pain, fever or headache.    . busPIRone (BUSPAR) 10 MG tablet Take 1 tablet (10 mg total) by mouth 2 (two) times daily. 60 tablet 1  . FLUoxetine (PROZAC) 40 MG capsule Take 1 capsule (40 mg total) by mouth every morning. 30 capsule 1  . Multiple Vitamin (MULTIVITAMIN WITH MINERALS) TABS tablet Take 1 tablet by mouth daily.    . traZODone (DESYREL) 100 MG tablet Take 1 tablet (100 mg total) by mouth at bedtime. 30 tablet 0   No current facility-administered medications for this visit.     Medical Decision Making:  Review of Medication Regimen & Side Effects (2) and Review of New Medication or Change in Dosage (2)  Treatment Plan Summary:Medication management and Plan   Continue Prozac  40 mg every morning DC Vistaril Will  start on BuSpar 10 mg daily and advised her to increase  the dose to 10 mg by mouth twice a day if she continues to have anxiety and she agreed with the plan Continue   trazodone 100 mg by mouth daily at bedtime  Discussed with  patient about her medications and she demonstrated understanding  Follow-up in 3 weeks or earlier depending on her symptoms   More than 50% of the time spent in psychoeducation, counseling and coordination of care.    This note was generated in part or whole with voice recognition software. Voice regonition is usually quite accurate but there are transcription errors that can and very often do occur. I apologize for any typographical errors that were not detected and corrected.   Brandy HaleUzma Arris Meyn 11/09/2016, 3:08 PM
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
## 2016-11-26 ENCOUNTER — Ambulatory Visit: Payer: Self-pay | Admitting: Licensed Clinical Social Worker

## 2016-12-01 ENCOUNTER — Ambulatory Visit: Payer: Medicaid Other | Admitting: Psychiatry

## 2017-01-03 ENCOUNTER — Telehealth: Payer: Self-pay | Admitting: *Deleted

## 2017-01-03 DIAGNOSIS — D500 Iron deficiency anemia secondary to blood loss (chronic): Secondary | ICD-10-CM

## 2017-01-03 NOTE — Telephone Encounter (Signed)
Called to ask to have labs checked, having symptoms of needing iron, pica, lower extremity edema bilaterally. Please advise

## 2017-01-04 ENCOUNTER — Ambulatory Visit: Payer: Medicaid Other

## 2017-01-04 NOTE — Telephone Encounter (Signed)
Left vm patient come in today. For lab only - I personally asked pt to call scheduling back to confirm when she could come for her lab apt.

## 2017-01-05 ENCOUNTER — Inpatient Hospital Stay: Payer: Medicaid Other | Attending: Internal Medicine

## 2017-01-05 ENCOUNTER — Telehealth: Payer: Self-pay | Admitting: *Deleted

## 2017-01-05 DIAGNOSIS — D509 Iron deficiency anemia, unspecified: Secondary | ICD-10-CM | POA: Diagnosis not present

## 2017-01-05 DIAGNOSIS — Z79899 Other long term (current) drug therapy: Secondary | ICD-10-CM | POA: Diagnosis not present

## 2017-01-05 DIAGNOSIS — D500 Iron deficiency anemia secondary to blood loss (chronic): Secondary | ICD-10-CM

## 2017-01-05 LAB — CBC WITH DIFFERENTIAL/PLATELET
Basophils Absolute: 0.1 10*3/uL (ref 0–0.1)
Basophils Relative: 1 %
Eosinophils Absolute: 0.1 10*3/uL (ref 0–0.7)
Eosinophils Relative: 2 %
HEMATOCRIT: 37.4 % (ref 35.0–47.0)
Hemoglobin: 12.7 g/dL (ref 12.0–16.0)
LYMPHS ABS: 1.1 10*3/uL (ref 1.0–3.6)
LYMPHS PCT: 22 %
MCH: 28.4 pg (ref 26.0–34.0)
MCHC: 33.9 g/dL (ref 32.0–36.0)
MCV: 83.7 fL (ref 80.0–100.0)
MONO ABS: 0.3 10*3/uL (ref 0.2–0.9)
MONOS PCT: 7 %
Neutro Abs: 3.4 10*3/uL (ref 1.4–6.5)
Neutrophils Relative %: 68 %
Platelets: 297 10*3/uL (ref 150–440)
RBC: 4.47 MIL/uL (ref 3.80–5.20)
RDW: 13.6 % (ref 11.5–14.5)
WBC: 5 10*3/uL (ref 3.6–11.0)

## 2017-01-05 LAB — BASIC METABOLIC PANEL
Anion gap: 5 (ref 5–15)
BUN: 13 mg/dL (ref 6–20)
CO2: 27 mmol/L (ref 22–32)
Calcium: 9.4 mg/dL (ref 8.9–10.3)
Chloride: 104 mmol/L (ref 101–111)
Creatinine, Ser: 0.72 mg/dL (ref 0.44–1.00)
GFR calc Af Amer: >60 mL/min (ref >60)
GFR calc non Af Amer: >60 mL/min (ref >60)
GLUCOSE: 82 mg/dL (ref 65–99)
Potassium: 4.4 mmol/L (ref 3.5–5.1)
Sodium: 136 mmol/L (ref 135–145)

## 2017-01-05 LAB — IRON AND TIBC
Iron: 63 ug/dL (ref 28–170)
Saturation Ratios: 15 % (ref 10.4–31.8)
TIBC: 432 ug/dL (ref 250–450)
UIBC: 369 ug/dL

## 2017-01-05 LAB — FERRITIN: Ferritin: 83 ng/mL (ref 11–307)

## 2017-01-05 NOTE — Telephone Encounter (Signed)
-----   Message from Earna CoderGovinda R Brahmanday, MD sent at 01/05/2017  2:28 PM EST ----- Please inform pt that her iron labs dont look too bad; however if she feels not very good/tired- okay to have IV Loni DollyFeraheme [she is scheduled to have it on 31st]. Its okay with me if she wants to have it sooner.

## 2017-01-05 NOTE — Telephone Encounter (Signed)
Patient return phone call at 1555. She would like to go ahead and proceed with IV iron. She is c/o worsening fatigue. She would like to have her IV iron tom. Since she is off on Thursday this week. This would avoid her taking additional pal at work. At this time, Dr. Donneta RombergBrahmanday doesn't not need to see pt. We can cnl  apts on 01/12/17. I will have scheduling arrange for a new apts after tomorrow for patient. Marland Kitchen. approved by Dr. Leonard SchwartzB and Chip BoerVicki in infusion to add to sch. Tomorrow at 2 pm. 01/06/17 for IV fereheme.

## 2017-01-06 ENCOUNTER — Telehealth: Payer: Self-pay

## 2017-01-06 ENCOUNTER — Inpatient Hospital Stay: Payer: Medicaid Other

## 2017-01-06 ENCOUNTER — Ambulatory Visit (INDEPENDENT_AMBULATORY_CARE_PROVIDER_SITE_OTHER): Payer: Medicaid Other | Admitting: Licensed Clinical Social Worker

## 2017-01-06 VITALS — BP 118/83 | HR 66 | Temp 99.0°F | Resp 18

## 2017-01-06 DIAGNOSIS — D509 Iron deficiency anemia, unspecified: Secondary | ICD-10-CM | POA: Diagnosis not present

## 2017-01-06 DIAGNOSIS — F4323 Adjustment disorder with mixed anxiety and depressed mood: Secondary | ICD-10-CM

## 2017-01-06 DIAGNOSIS — D508 Other iron deficiency anemias: Secondary | ICD-10-CM

## 2017-01-06 MED ORDER — SODIUM CHLORIDE 0.9 % IV SOLN
510.0000 mg | Freq: Once | INTRAVENOUS | Status: AC
  Administered 2017-01-06: 510 mg via INTRAVENOUS
  Filled 2017-01-06: qty 17

## 2017-01-06 MED ORDER — SODIUM CHLORIDE 0.9 % IV SOLN
Freq: Once | INTRAVENOUS | Status: AC
  Administered 2017-01-06: 15:00:00 via INTRAVENOUS
  Filled 2017-01-06: qty 1000

## 2017-01-06 NOTE — Telephone Encounter (Signed)
pt seen [name] today and while at appt she states that she did not have enough medcations to get to her next appt.

## 2017-01-06 NOTE — Telephone Encounter (Signed)
left message on doctor line for refills on trazodone, and buspart and prozac.  no additional refills

## 2017-01-11 NOTE — Progress Notes (Signed)
   THERAPIST PROGRESS NOTE  Session Time: 50mn  Participation Level: Active  Behavioral Response: Neat and Well GroomedAlertDepressed  Type of Therapy: Individual Therapy  Treatment Goals addressed: Coping  Interventions: CBT, Motivational Interviewing, Solution Focused, Strength-based, Supportive and Reframing  Summary: ANORMAGENE HARVIEis a 46y.o. female who presents with continued symptoms of her diagnosis.  LCSW discussed what psychotherapy is and is not and the importance of the therapeutic relationship to include open and honest communication between client and therapist and building trust.  Reviewed advantages and disadvantages of the therapeutic process and limitations to the therapeutic relationship including LCSW's role in maintaining the safety of the client, others and those in client's care.  Therapist met with Patient in an initial therapy session to assess current mood and to build rapport. Therapist engaged Patient in discussion about her life and what is going well for her. Therapist provided support for Patient as she shared details about her life, her current stressors, mood, coping skills, her past, and her children. Therapist prompted Patient to discuss her support system and ways that she manages her daily stress, anger, and frustrations.     Suicidal/Homicidal: No  Therapist Response: LCSW provided Patient with ongoing emotional support and encouragement.  Normalized her feelings.  Commended Patient on her progress and reinforced the importance of client staying focused on her own strengths and resources and resiliency. Processed various strategies for dealing with stressors.    Plan: Return again in 2 weeks.  Diagnosis: Axis I: Adjustment Disorder with Mixed Emotional Features    Axis II: No diagnosis    NLubertha South LCSW 01/11/2017

## 2017-01-12 ENCOUNTER — Inpatient Hospital Stay: Payer: Medicaid Other

## 2017-01-12 ENCOUNTER — Inpatient Hospital Stay: Payer: Medicaid Other | Admitting: Internal Medicine

## 2017-01-12 ENCOUNTER — Other Ambulatory Visit: Payer: Medicaid Other

## 2017-01-18 ENCOUNTER — Ambulatory Visit: Payer: Medicaid Other | Admitting: Psychiatry

## 2017-01-20 ENCOUNTER — Ambulatory Visit: Payer: Medicaid Other | Admitting: Licensed Clinical Social Worker

## 2017-02-17 ENCOUNTER — Telehealth: Payer: Self-pay

## 2017-02-17 NOTE — Telephone Encounter (Signed)
called in a 15 day supply only with no additional refill on the trazodone, buspar, and prozac.

## 2017-02-17 NOTE — Telephone Encounter (Signed)
pt called states she needs refill pt was transfered up front to make appt .

## 2017-03-09 ENCOUNTER — Ambulatory Visit: Payer: Medicaid Other | Admitting: Psychiatry

## 2017-03-09 ENCOUNTER — Telehealth: Payer: Self-pay

## 2017-03-09 NOTE — Telephone Encounter (Signed)
Not seen since oct... [place] w/o appt.

## 2017-03-09 NOTE — Telephone Encounter (Signed)
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  pt states she would r/s her appt but she needs a refill on her medications.

## 2017-03-09 NOTE — Telephone Encounter (Signed)
Pt was called and told that no medication would be refill until she is seen.  Pt asked what she should do.  Pt was told to make another appt to be seen.  And if she can not do without she can go to urgent care or er or she may contact  pcp and see if they would refill.

## 2017-05-25 ENCOUNTER — Other Ambulatory Visit: Payer: Self-pay | Admitting: *Deleted

## 2017-05-25 DIAGNOSIS — Z862 Personal history of diseases of the blood and blood-forming organs and certain disorders involving the immune mechanism: Secondary | ICD-10-CM

## 2017-05-26 ENCOUNTER — Inpatient Hospital Stay: Payer: Medicaid Other

## 2017-05-26 ENCOUNTER — Inpatient Hospital Stay: Payer: Medicaid Other | Admitting: Hematology and Oncology

## 2017-05-26 NOTE — Progress Notes (Deleted)
[facility] OFFICE PROGRESS NOTE  Patient Care Team: Rafael Bihari, MD as PCP - General (Internal Medicine)   SUMMARY OF HEMATOLOGIC HISTORY:  # Iron deficiency Anemia-sec to gastric bypass [Nov 2011] on IV iron.   INTERVAL HISTORY:  In the interim patient was admitted to the hospital for pneumonia/lung abscess had to be drained. Currently resolved.  Continues to have fatigue.   Denies any blood in stools black stools. She does not take B12 pills. Patient last had Feraheme in March 2017.   REVIEW OF SYSTEMS:  A complete 10 point review of system is done which is negative except mentioned above/history of present illness.   PAST MEDICAL HISTORY :  Past Medical History:  Diagnosis Date  . Abscess of left lung with pneumonia (HCC)   . Anxiety   . CAP (community acquired pneumonia) 01/21/2016  . 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 Depression   . Gastric ulcer   . Iron deficiency anemia   . Last menstrual period (LMP) > 10 days ago    had uterine ablation, last cycle >5 years ago  . Lung abscess (HCC) 02/03/2016  . Malnutrition following gastrointestinal surgery 10/22/2014  . 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 Protein-calorie malnutrition (HCC)   . 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 Thrombocytopenia (HCC)     PAST SURGICAL HISTORY :   Past Surgical History:  Procedure Laterality Date  . APPENDECTOMY    . CESAREAN SECTION    . CHOLECYSTECTOMY    . COLON SURGERY     resection of sigmoid volvulus  . GASTRIC BYPASS  2011  . uterine ablation    . VIDEO BRONCHOSCOPY N/A 02/08/2016   Procedure: VIDEO BRONCHOSCOPY WITH ENDOBRONCHIAL LAVAGE;  Surgeon: Purcell Nails, MD;  Location: [facility] OR;  Service: Thoracic;  Laterality: N/A;    FAMILY HISTORY :   Family History  Problem Relation Age of Onset  . Breast cancer Mother   . Hypertension Father   . Prostate cancer Father   . Hypertension Brother   . Anxiety  disorder Brother     SOCIAL HISTORY:   Social History  Substance Use Topics  . Smoking status: Never Smoker  . Smokeless tobacco: Never Used  . Alcohol use No    ALLERGIES:  is allergic to phenobarbital and sulfa antibiotics.  MEDICATIONS:  Current Outpatient Prescriptions  Medication Sig Dispense Refill  . acetaminophen (TYLENOL) 500 MG tablet Take 1,000 mg by mouth every 6 (six) hours as needed for mild pain, fever or headache.    . busPIRone (BUSPAR) 10 MG tablet Take 1 tablet (10 mg total) by mouth 2 (two) times daily. 60 tablet 1  . FLUoxetine (PROZAC) 40 MG capsule Take 1 capsule (40 mg total) by mouth every morning. 30 capsule 1  . Multiple Vitamin (MULTIVITAMIN WITH MINERALS) TABS tablet Take 1 tablet by mouth daily.    . traZODone (DESYREL) 100 MG tablet Take 1 tablet (100 mg total) by mouth at bedtime. 30 tablet 0   No current facility-administered medications for this visit.     PHYSICAL EXAMINATION:   There were no vitals taken for this visit.  There were no vitals filed for this visit.  GENERAL: moderately nourished moderately built Caucasian female patient. She is alone. EYES: no pallor or icterus OROPHARYNX: no thrush or ulceration; good dentition  NECK: supple, no masses felt LYMPH:  no palpable lymphadenopathy in the cervical, axillary or inguinal regions LUNGS: clear  to auscultation and  No wheeze or crackles HEART/CVS: regular rate & rhythm and no murmurs; No lower extremity edema ABDOMEN:abdomen soft, non-tender and normal bowel sounds Musculoskeletal:no cyanosis of digits and no clubbing  PSYCH: alert & oriented x 3 with fluent speech NEURO: no focal motor/sensory deficits SKIN:  no rashes or significant lesions  LABORATORY DATA:  I have reviewed the data as listed    Component Value Date/Time   NA 136 01/05/2017 0838   NA 140 12/03/2014 1459   K 4.4 01/05/2017 0838   K 3.7 12/03/2014 1459   CL 104 01/05/2017 0838   CL 105 12/03/2014 1459    CO2 27 01/05/2017 0838   CO2 24 12/03/2014 1459   GLUCOSE 82 01/05/2017 0838   GLUCOSE 85 12/03/2014 1459   BUN 13 01/05/2017 0838   BUN 2 (L) 12/03/2014 1459   CREATININE 0.72 01/05/2017 0838   CREATININE 0.74 12/03/2014 1459   CALCIUM 9.4 01/05/2017 0838   CALCIUM 7.9 (L) 12/03/2014 1459   PROT 7.2 08/17/2016 1130   PROT 5.9 (L) 12/03/2014 1459   ALBUMIN 4.0 08/17/2016 1130   ALBUMIN 2.7 (L) 12/03/2014 1459   AST 23 08/17/2016 1130   AST 29 12/03/2014 1459   ALT 15 08/17/2016 1130   ALT 22 12/03/2014 1459   ALKPHOS 76 08/17/2016 1130   ALKPHOS 91 12/03/2014 1459   BILITOT 0.3 08/17/2016 1130   BILITOT 0.2 12/03/2014 1459   GFRNONAA >60 01/05/2017 0838   GFRNONAA >60 12/03/2014 1459   GFRAA >60 01/05/2017 0838   GFRAA >60 12/03/2014 1459    No results found for: SPEP, UPEP  Lab Results  Component Value Date   WBC 5.0 01/05/2017   NEUTROABS 3.4 01/05/2017   HGB 12.7 01/05/2017   HCT 37.4 01/05/2017   MCV 83.7 01/05/2017   PLT 297 01/05/2017      Chemistry      Component Value Date/Time   NA 136 01/05/2017 0838   NA 140 12/03/2014 1459   K 4.4 01/05/2017 0838   K 3.7 12/03/2014 1459   CL 104 01/05/2017 0838   CL 105 12/03/2014 1459   CO2 27 01/05/2017 0838   CO2 24 12/03/2014 1459   BUN 13 01/05/2017 0838   BUN 2 (L) 12/03/2014 1459   CREATININE 0.72 01/05/2017 0838   CREATININE 0.74 12/03/2014 1459      Component Value Date/Time   CALCIUM 9.4 01/05/2017 0838   CALCIUM 7.9 (L) 12/03/2014 1459   ALKPHOS 76 08/17/2016 1130   ALKPHOS 91 12/03/2014 1459   AST 23 08/17/2016 1130   AST 29 12/03/2014 1459   ALT 15 08/17/2016 1130   ALT 22 12/03/2014 1459   BILITOT 0.3 08/17/2016 1130   BILITOT 0.2 12/03/2014 1459       RADIOGRAPHIC STUDIES: I have personally reviewed the radiological images as listed and agreed with the findings in the report. No results found.   ASSESSMENT & PLAN: Iron defieicny Anemia- secondary gastric bypass. Last status post  Feraheme in March 2017. We will repeat the labs CBC ferritin iron studies today. If low recommend IV Feraheme again.  #  patient follow-up with NP in approximately 4 months/labs/ Feraheme.     Rosey BathMelissa C Corcoran, MD 05/26/2017 3:50 AM

## 2017-05-27 ENCOUNTER — Inpatient Hospital Stay: Payer: Medicaid Other

## 2017-05-27 ENCOUNTER — Inpatient Hospital Stay: Payer: Medicaid Other | Admitting: Hematology and Oncology

## 2017-05-27 NOTE — Progress Notes (Deleted)
[facility] OFFICE PROGRESS NOTE  Patient Care Team: Rafael Bihari, MD as PCP - General (Internal Medicine)   SUMMARY OF HEMATOLOGIC HISTORY:  # Iron deficiency Anemia-sec to gastric bypass [Nov 2011] on IV iron.   INTERVAL HISTORY:  In the interim patient was admitted to the hospital for pneumonia/lung abscess had to be drained. Currently resolved.  Continues to have fatigue.   Denies any blood in stools black stools. She does not take B12 pills. Patient last had Feraheme in March 2017.   REVIEW OF SYSTEMS:  A complete 10 point review of system is done which is negative except mentioned above/history of present illness.   PAST MEDICAL HISTORY :  Past Medical History:  Diagnosis Date  . Abscess of left lung with pneumonia (HCC)   . Anxiety   . CAP (community acquired pneumonia) 01/21/2016  . 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 Depression   . Gastric ulcer   . Iron deficiency anemia   . Last menstrual period (LMP) > 10 days ago    had uterine ablation, last cycle >5 years ago  . Lung abscess (HCC) 02/03/2016  . Malnutrition following gastrointestinal surgery 10/22/2014  . 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 Protein-calorie malnutrition (HCC)   . 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 Thrombocytopenia (HCC)     PAST SURGICAL HISTORY :   Past Surgical History:  Procedure Laterality Date  . APPENDECTOMY    . CESAREAN SECTION    . CHOLECYSTECTOMY    . COLON SURGERY     resection of sigmoid volvulus  . GASTRIC BYPASS  2011  . uterine ablation    . VIDEO BRONCHOSCOPY N/A 02/08/2016   Procedure: VIDEO BRONCHOSCOPY WITH ENDOBRONCHIAL LAVAGE;  Surgeon: Purcell Nails, MD;  Location: [facility] OR;  Service: Thoracic;  Laterality: N/A;    FAMILY HISTORY :   Family History  Problem Relation Age of Onset  . Breast cancer Mother   . Hypertension Father   . Prostate cancer Father   . Hypertension Brother   . Anxiety  disorder Brother     SOCIAL HISTORY:   Social History  Substance Use Topics  . Smoking status: Never Smoker  . Smokeless tobacco: Never Used  . Alcohol use No    ALLERGIES:  is allergic to phenobarbital and sulfa antibiotics.  MEDICATIONS:  Current Outpatient Prescriptions  Medication Sig Dispense Refill  . acetaminophen (TYLENOL) 500 MG tablet Take 1,000 mg by mouth every 6 (six) hours as needed for mild pain, fever or headache.    . busPIRone (BUSPAR) 10 MG tablet Take 1 tablet (10 mg total) by mouth 2 (two) times daily. 60 tablet 1  . FLUoxetine (PROZAC) 40 MG capsule Take 1 capsule (40 mg total) by mouth every morning. 30 capsule 1  . Multiple Vitamin (MULTIVITAMIN WITH MINERALS) TABS tablet Take 1 tablet by mouth daily.    . traZODone (DESYREL) 100 MG tablet Take 1 tablet (100 mg total) by mouth at bedtime. 30 tablet 0   No current facility-administered medications for this visit.     PHYSICAL EXAMINATION:   There were no vitals taken for this visit.  There were no vitals filed for this visit.  GENERAL: moderately nourished moderately built Caucasian female patient. She is alone. EYES: no pallor or icterus OROPHARYNX: no thrush or ulceration; good dentition  NECK: supple, no masses felt LYMPH:  no palpable lymphadenopathy in the cervical, axillary or inguinal regions LUNGS: clear  to auscultation and  No wheeze or crackles HEART/CVS: regular rate & rhythm and no murmurs; No lower extremity edema ABDOMEN:abdomen soft, non-tender and normal bowel sounds Musculoskeletal:no cyanosis of digits and no clubbing  PSYCH: alert & oriented x 3 with fluent speech NEURO: no focal motor/sensory deficits SKIN:  no rashes or significant lesions  LABORATORY DATA:  I have reviewed the data as listed    Component Value Date/Time   NA 136 01/05/2017 0838   NA 140 12/03/2014 1459   K 4.4 01/05/2017 0838   K 3.7 12/03/2014 1459   CL 104 01/05/2017 0838   CL 105 12/03/2014 1459    CO2 27 01/05/2017 0838   CO2 24 12/03/2014 1459   GLUCOSE 82 01/05/2017 0838   GLUCOSE 85 12/03/2014 1459   BUN 13 01/05/2017 0838   BUN 2 (L) 12/03/2014 1459   CREATININE 0.72 01/05/2017 0838   CREATININE 0.74 12/03/2014 1459   CALCIUM 9.4 01/05/2017 0838   CALCIUM 7.9 (L) 12/03/2014 1459   PROT 7.2 08/17/2016 1130   PROT 5.9 (L) 12/03/2014 1459   ALBUMIN 4.0 08/17/2016 1130   ALBUMIN 2.7 (L) 12/03/2014 1459   AST 23 08/17/2016 1130   AST 29 12/03/2014 1459   ALT 15 08/17/2016 1130   ALT 22 12/03/2014 1459   ALKPHOS 76 08/17/2016 1130   ALKPHOS 91 12/03/2014 1459   BILITOT 0.3 08/17/2016 1130   BILITOT 0.2 12/03/2014 1459   GFRNONAA >60 01/05/2017 0838   GFRNONAA >60 12/03/2014 1459   GFRAA >60 01/05/2017 0838   GFRAA >60 12/03/2014 1459    No results found for: SPEP, UPEP  Lab Results  Component Value Date   WBC 5.0 01/05/2017   NEUTROABS 3.4 01/05/2017   HGB 12.7 01/05/2017   HCT 37.4 01/05/2017   MCV 83.7 01/05/2017   PLT 297 01/05/2017      Chemistry      Component Value Date/Time   NA 136 01/05/2017 0838   NA 140 12/03/2014 1459   K 4.4 01/05/2017 0838   K 3.7 12/03/2014 1459   CL 104 01/05/2017 0838   CL 105 12/03/2014 1459   CO2 27 01/05/2017 0838   CO2 24 12/03/2014 1459   BUN 13 01/05/2017 0838   BUN 2 (L) 12/03/2014 1459   CREATININE 0.72 01/05/2017 0838   CREATININE 0.74 12/03/2014 1459      Component Value Date/Time   CALCIUM 9.4 01/05/2017 0838   CALCIUM 7.9 (L) 12/03/2014 1459   ALKPHOS 76 08/17/2016 1130   ALKPHOS 91 12/03/2014 1459   AST 23 08/17/2016 1130   AST 29 12/03/2014 1459   ALT 15 08/17/2016 1130   ALT 22 12/03/2014 1459   BILITOT 0.3 08/17/2016 1130   BILITOT 0.2 12/03/2014 1459       RADIOGRAPHIC STUDIES: I have personally reviewed the radiological images as listed and agreed with the findings in the report. No results found.   ASSESSMENT & PLAN: Iron defieicny Anemia- secondary gastric bypass. Last status post  Feraheme in March 2017. We will repeat the labs CBC ferritin iron studies today. If low recommend IV Feraheme again.  #  patient follow-up with NP in approximately 4 months/labs/ Feraheme.     Rosey BathMelissa C Corcoran, MD 05/27/2017 3:56 AM

## 2018-06-06 ENCOUNTER — Encounter (HOSPITAL_COMMUNITY): Payer: Self-pay | Admitting: Emergency Medicine

## 2018-06-06 ENCOUNTER — Other Ambulatory Visit: Payer: Self-pay

## 2018-06-06 ENCOUNTER — Emergency Department (HOSPITAL_COMMUNITY)
Admission: EM | Admit: 2018-06-06 | Discharge: 2018-06-07 | Disposition: A | Discharge status: Home / Self Care | Payer: Self-pay | Attending: Emergency Medicine | Admitting: Emergency Medicine

## 2018-06-06 DIAGNOSIS — Z5321 Procedure and treatment not carried out due to patient leaving prior to being seen by health care provider: Secondary | ICD-10-CM | POA: Insufficient documentation

## 2018-06-06 DIAGNOSIS — R569 Unspecified convulsions: Secondary | ICD-10-CM | POA: Insufficient documentation

## 2018-06-06 LAB — CBC
HEMATOCRIT: 39 % (ref 36.0–46.0)
Hemoglobin: 12.9 g/dL (ref 12.0–15.0)
MCH: 29.4 pg (ref 26.0–34.0)
MCHC: 33.1 g/dL (ref 30.0–36.0)
MCV: 88.8 fL (ref 78.0–100.0)
Platelets: 225 10*3/uL (ref 150–400)
RBC: 4.39 MIL/uL (ref 3.87–5.11)
RDW: 13.1 % (ref 11.5–15.5)
WBC: 11.9 10*3/uL — AB (ref 4.0–10.5)

## 2018-06-06 NOTE — ED Triage Notes (Addendum)
Pt states she had a seizure about 2 years ago with a "negative" workup.  Tonight son witnessed a grand mal seizure lasting 2-3 minutes.  Son has epilepsy and states mom woke up fairly quickly and was alert to her surroundings. This occurred about 2200 tonight.  Pt is A & O x 4 and neuro intact at this time.

## 2018-06-07 LAB — COMPREHENSIVE METABOLIC PANEL
ALBUMIN: 3.5 g/dL (ref 3.5–5.0)
ALT: 37 U/L (ref 0–44)
ANION GAP: 13 (ref 5–15)
AST: 56 U/L — AB (ref 15–41)
Alkaline Phosphatase: 113 U/L (ref 38–126)
BILIRUBIN TOTAL: 1 mg/dL (ref 0.3–1.2)
CHLORIDE: 89 mmol/L — AB (ref 98–111)
CO2: 27 mmol/L (ref 22–32)
Calcium: 9 mg/dL (ref 8.9–10.3)
Creatinine, Ser: 0.73 mg/dL (ref 0.44–1.00)
GFR calc Af Amer: >60 mL/min (ref >60)
GFR calc non Af Amer: >60 mL/min (ref >60)
GLUCOSE: 148 mg/dL — AB (ref 70–99)
POTASSIUM: 3.1 mmol/L — AB (ref 3.5–5.1)
Sodium: 129 mmol/L — ABNORMAL LOW (ref 135–145)
Total Protein: 6.9 g/dL (ref 6.5–8.1)

## 2018-06-07 NOTE — ED Notes (Signed)
Called pt for vitals. No response. Will try again.

## 2018-06-07 NOTE — ED Notes (Signed)
Pt called for room multiple times with no response. 

## 2018-06-08 ENCOUNTER — Emergency Department (HOSPITAL_COMMUNITY): Payer: Self-pay

## 2018-06-08 ENCOUNTER — Encounter (HOSPITAL_COMMUNITY): Payer: Self-pay

## 2018-06-08 ENCOUNTER — Emergency Department (HOSPITAL_COMMUNITY)
Admission: EM | Admit: 2018-06-08 | Discharge: 2018-06-08 | Disposition: A | Discharge status: Home / Self Care | Payer: Self-pay | Attending: Emergency Medicine | Admitting: Emergency Medicine

## 2018-06-08 DIAGNOSIS — R569 Unspecified convulsions: Secondary | ICD-10-CM | POA: Insufficient documentation

## 2018-06-08 DIAGNOSIS — Z79899 Other long term (current) drug therapy: Secondary | ICD-10-CM | POA: Insufficient documentation

## 2018-06-08 LAB — BASIC METABOLIC PANEL
ANION GAP: 8 (ref 5–15)
BUN: 6 mg/dL (ref 6–20)
CALCIUM: 8.6 mg/dL — AB (ref 8.9–10.3)
CO2: 26 mmol/L (ref 22–32)
Chloride: 105 mmol/L (ref 98–111)
Creatinine, Ser: 0.79 mg/dL (ref 0.44–1.00)
GFR calc Af Amer: >60 mL/min (ref >60)
GFR calc non Af Amer: >60 mL/min (ref >60)
GLUCOSE: 88 mg/dL (ref 70–99)
Potassium: 3.8 mmol/L (ref 3.5–5.1)
Sodium: 139 mmol/L (ref 135–145)

## 2018-06-08 LAB — CBC
HCT: 35.7 % — ABNORMAL LOW (ref 36.0–46.0)
HEMOGLOBIN: 11.3 g/dL — AB (ref 12.0–15.0)
MCH: 29.8 pg (ref 26.0–34.0)
MCHC: 31.7 g/dL (ref 30.0–36.0)
MCV: 94.2 fL (ref 78.0–100.0)
Platelets: 202 10*3/uL (ref 150–400)
RBC: 3.79 MIL/uL — ABNORMAL LOW (ref 3.87–5.11)
RDW: 13.4 % (ref 11.5–15.5)
WBC: 10.7 10*3/uL — ABNORMAL HIGH (ref 4.0–10.5)

## 2018-06-08 LAB — I-STAT BETA HCG BLOOD, ED (MC, WL, AP ONLY): I-stat hCG, quantitative: <5 m[IU]/mL (ref <5)

## 2018-06-08 LAB — MAGNESIUM: Magnesium: 2.2 mg/dL (ref 1.7–2.4)

## 2018-06-08 MED ORDER — LEVETIRACETAM IN NACL 1000 MG/100ML IV SOLN
1000.0000 mg | Freq: Once | INTRAVENOUS | Status: AC
  Administered 2018-06-08: 1000 mg via INTRAVENOUS
  Filled 2018-06-08: qty 100

## 2018-06-08 MED ORDER — METOPROLOL TARTRATE 5 MG/5ML IV SOLN
5.0000 mg | Freq: Once | INTRAVENOUS | Status: AC
  Administered 2018-06-08: 5 mg via INTRAVENOUS
  Filled 2018-06-08: qty 5

## 2018-06-08 MED ORDER — LORAZEPAM 2 MG/ML IJ SOLN
1.0000 mg | Freq: Once | INTRAMUSCULAR | Status: AC
  Administered 2018-06-08: 1 mg via INTRAVENOUS
  Filled 2018-06-08: qty 1

## 2018-06-08 MED ORDER — SODIUM CHLORIDE 0.9 % IV BOLUS
1000.0000 mL | Freq: Once | INTRAVENOUS | Status: AC
  Administered 2018-06-08: 1000 mL via INTRAVENOUS

## 2018-06-08 MED ORDER — LEVETIRACETAM 500 MG PO TABS: 500.0000 mg | ORAL_TABLET | Freq: Two times a day (BID) | ORAL | 0 refills | Status: AC

## 2018-06-08 NOTE — ED Triage Notes (Signed)
Pt presents for evaluation of seizures, was triaged on Tuesday but LWBS.  Pt reports she was called today s/p lab work and referred here.  Pt reports last night, she had 2 "intense" seizures with mouth trauma reported and incontinence.
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
## 2018-06-08 NOTE — ED Provider Notes (Signed)
[facility] EMERGENCY DEPARTMENT Provider Note   CSN: [id number] Arrival date & time: 06/08/18  1539     History   Chief Complaint Chief Complaint  Patient presents with  . Seizures    HPI Summer Howard is a 47 y.o. female presenting after multiple seizures over the past 48 hours.  Patient states that Tuesday afternoon she had 3 episodes of seizure-like activity, unwitnessed.  Patient states that she began having hypersensitivity in her feet that worked its way up her body all the way to her head associated with hyperacusis and photophobia and then she lost consciousness.  Patient states that when she awoke she had urinated on herself and this happened 2 more times that night.  Patient then came to the emergency department but left without being seen.  Patient states she had a another seizure last night, unwitnessed, she states that she had the same hypersensitivity, hyperacusis and photophobia followed by a loss of consciousness and some confusion upon awakening.  Patient states that she did have one grand mal seizure approximately 2 years ago however she does not take anticonvulsants at this time. Patient denies all other complaints.  Patient's history reviewed, 08/03/2015 patient was seen for seizures here.  At that time she was stated that she had seizures as a baby and was given phenobarbital but found that she was allergic. HPI  Past Medical History:  Diagnosis Date  . Abscess of left lung with pneumonia (HCC)   . Anxiety   . CAP (community acquired pneumonia) 01/21/2016  . 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 Depression   . Gastric ulcer   . Iron deficiency anemia   . Last menstrual period (LMP) > 10 days ago    had uterine ablation, last cycle >5 years ago  . Lung abscess (HCC) 02/03/2016  . Malnutrition following gastrointestinal surgery 10/22/2014  . 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 Protein-calorie  malnutrition (HCC)   . 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 Thrombocytopenia [facility])     Patient Active Problem List   Diagnosis Date Noted  . Menorrhagia 10/12/2016  . Abscess of left lung with pneumonia (HCC)   . Protein-calorie malnutrition, severe 02/15/2016  . Protein-calorie malnutrition (HCC)   . Necrotizing pneumonia (HCC) 02/06/2016  . Pleural effusion 02/06/2016  . Hemoptysis 02/06/2016  . Iron deficiency anemia   . Lung abscess (HCC) 02/03/2016  . Chest pain with low risk for cardiac etiology 01/21/2016  . CAP (community acquired pneumonia) 01/21/2016  . Pneumonia 01/20/2016  . Pleuritic chest pain 01/20/2016  . Anemia following surgery 10/22/2014  . 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 Malnutrition following gastrointestinal surgery 10/22/2014  . 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 Gastroduodenal ulcer 07/31/2014  . Other specified postprocedural states 07/31/2014  . Anxiety, generalized 03/07/2014  . Small bowel obstruction (HCC) 04/02/2013  . Fatty infiltration of liver 02/25/2010    Past Surgical History:  Procedure Laterality Date  . APPENDECTOMY    . CESAREAN SECTION    . CHOLECYSTECTOMY    . COLON SURGERY     resection of sigmoid volvulus  . GASTRIC BYPASS  2011  . uterine ablation    . VIDEO BRONCHOSCOPY N/A 02/08/2016   Procedure: VIDEO BRONCHOSCOPY WITH ENDOBRONCHIAL LAVAGE;  Surgeon: Purcell Nails, MD;  Location: [facility] OR;  Service: Thoracic;  Laterality: N/A;     OB History   None      Home  Medications    Prior to Admission medications   Medication Sig Start Date End Date Taking? Authorizing Provider  acetaminophen (TYLENOL) 500 MG tablet Take 500-1,000 mg by mouth every 6 (six) hours as needed for mild pain, fever or headache.    Yes [provider]  ibuprofen (ADVIL,MOTRIN) 200 MG tablet Take 600 mg by mouth every 6 (six) hours as needed (for pain).   Yes [provider]    Multiple Vitamin (MULTIVITAMIN WITH MINERALS) TABS tablet Take 1 tablet by mouth daily.   Yes [provider]  busPIRone (BUSPAR) 10 MG tablet Take 1 tablet (10 mg total) by mouth 2 (two) times daily. Patient not taking: Reported on 06/08/2018 11/09/16   Brandy Hale, MD  FLUoxetine (PROZAC) 40 MG capsule Take 1 capsule (40 mg total) by mouth every morning. Patient not taking: Reported on 06/08/2018 11/09/16   Brandy Hale, MD  levETIRAcetam (KEPPRA) 500 MG tablet Take 1 tablet (500 mg total) by mouth 2 (two) times daily. 06/08/18   Harlene Salts A, PA-C  traZODone (DESYREL) 100 MG tablet Take 1 tablet (100 mg total) by mouth at bedtime. Patient not taking: Reported on 06/08/2018 11/09/16   Brandy Hale, MD    Family History Family History  Problem Relation Age of Onset  . Breast cancer Mother   . Hypertension Father   . Prostate cancer Father   . Hypertension Brother   . Anxiety disorder Brother     Social History Social History   Tobacco Use  . Smoking status: Never Smoker  . Smokeless tobacco: Never Used  Substance Use Topics  . Alcohol use: No    Alcohol/week: 0.0 oz  . Drug use: No     Allergies   Phenobarbital and Sulfa antibiotics   Review of Systems Review of Systems  Constitutional: Negative.  Negative for chills, fatigue and fever.  HENT: Negative for congestion, ear pain, rhinorrhea, sore throat and trouble swallowing.        Hyperacusis  Eyes: Positive for photophobia. Negative for visual disturbance.  Respiratory: Negative.  Negative for cough, chest tightness and shortness of breath.   Cardiovascular: Negative.  Negative for chest pain and leg swelling.  Gastrointestinal: Negative.  Negative for abdominal pain, blood in stool, diarrhea, nausea and vomiting.  Genitourinary: Negative.  Negative for dysuria, flank pain and hematuria.  Musculoskeletal: Negative.  Negative for arthralgias, myalgias and neck pain.  Skin: Negative.  Negative for rash.   Neurological: Positive for seizures. Negative for dizziness, syncope, weakness, light-headedness and headaches.     Physical Exam Updated Vital Signs BP 119/76   Pulse 100   Temp 98.4 F (36.9 C) (Oral)   Resp 20   SpO2 98%   Physical Exam  Constitutional: She is oriented to person, place, and time. She appears well-developed and well-nourished. No distress.  HENT:  Head: Normocephalic and atraumatic.  Eyes: Pupils are equal, round, and reactive to light.  Neck: Normal range of motion. Neck supple.  Cardiovascular: Normal rate and regular rhythm.  Pulmonary/Chest: Effort normal and breath sounds normal. No respiratory distress.  Abdominal: Soft. Bowel sounds are normal. There is no tenderness. There is no rebound and no guarding.  Genitourinary:  Genitourinary Comments: Deferred by patient  Musculoskeletal: Normal range of motion. She exhibits no edema or deformity.  Neurological: She is alert and oriented to person, place, and time. She has normal strength. She displays normal reflexes. No cranial nerve deficit or sensory deficit. She exhibits normal muscle tone. She displays  a negative Romberg sign. Coordination normal.  Skin: Skin is warm and dry. Capillary refill takes less than 2 seconds.  Psychiatric: She has a normal mood and affect. Her behavior is normal.     ED Treatments / Results  Labs (all labs ordered are listed, but only abnormal results are displayed) Labs Reviewed  BASIC METABOLIC PANEL - Abnormal; Notable for the following components:      Result Value   Calcium 8.6 (*)    All other components within normal limits  CBC - Abnormal; Notable for the following components:   WBC 10.7 (*)    RBC 3.79 (*)    Hemoglobin 11.3 (*)    HCT 35.7 (*)    All other components within normal limits  MAGNESIUM  I-STAT BETA HCG BLOOD, ED ([facility], [facility], [facility] ONLY)  CBG MONITORING, ED    EKG None  Radiology Ct Head Wo Contrast  Result Date: 06/08/2018 CLINICAL DATA:  Two  seizures this week. EXAM: CT HEAD WITHOUT CONTRAST TECHNIQUE: Contiguous axial images were obtained from the base of the skull through the vertex without intravenous contrast. COMPARISON:  None. FINDINGS: Brain: No evidence of acute large vascular territory infarction, hemorrhage, hydrocephalus, extra-axial collection or mass lesion/mass effect. Vascular: No hyperdense vessels or unexpected calcifications. Skull: Intact Sinuses/Orbits: Nonacute Other: None IMPRESSION: Unremarkable CT of the head. Electronically Signed   By: Tollie Ethavid  Kwon M.D.   On: 06/08/2018 17:53    Procedures Procedures (including critical care time)  Medications Ordered in ED Medications  levETIRAcetam (KEPPRA) IVPB 1000 mg/100 mL premix (0 mg Intravenous Stopped 06/08/18 2220)  metoprolol tartrate (LOPRESSOR) injection 5 mg (5 mg Intravenous Given 06/08/18 2123)  LORazepam (ATIVAN) injection 1 mg (1 mg Intravenous Given 06/08/18 2128)  sodium chloride 0.9 % bolus 1,000 mL (1,000 mLs Intravenous Bolus 06/08/18 2122)     Initial Impression / Assessment and Plan / ED Course  I have reviewed the triage vital signs and the nursing notes.  Pertinent labs & imaging results that were available during my care of the patient were reviewed by me and considered in my medical decision making (see chart for details).   Seizures: Patient with new history of possible seizure.  Patient given loading dose of Keppra here in department..  Patient given prescription for 1 month supply of Keppra, 500 mg twice daily.  Patient given referral [facility] for follow-up.  Patient's noticed to be in sinus tachycardia in department, this was treated with fluids and antianxiety medication and resolved.  Patient is afebrile, not in acute distress, denies pain.  Patient states that she is ready to be discharged.  Patient given extensive return precautions, patient states understanding of return precautions.  Patient informed not to drive, operate heavy  machinery, swim or take a bath due to risk of seizures.  Patient states understanding of care plan and states that she will follow-up with neurology tomorrow morning.  Patient states that her son is coming to pick her up from the emergency department.  Patient has been seen by Dr. Patria Maneampos who agrees with plan to discharge with follow-up with neurology.  Final Clinical Impressions(s) / ED Diagnoses   Final diagnoses:  Seizures [facility](HCC)    ED Discharge Orders        Ordered    levETIRAcetam (KEPPRA) 500 MG tablet  2 times daily     06/08/18 2203       Elizabeth PalauMorelli, Nadalee Neiswender A, PA-C 06/08/18 2301    Azalia Bilisampos, Kevin, MD 06/09/18 Ernestina [id number]

## 2018-06-08 NOTE — ED Notes (Signed)
06/08/2018, pt. Called and left a voicemail on this Rn's office VM.  Returned pt.,s call, no answer, unable to leave a message.

## 2018-06-08 NOTE — ED Notes (Signed)
06/08/2018, Pt. Called and requested to have her labs discussed with her.  Explained to pt. That the EDP  Is the one who can speak with her about her labs.  Pt. Did state, "I am not feeling any better."  I encouraged the pt. To come back to be evaluated.  She stated, "I think I will come back."

## 2018-06-08 NOTE — Discharge Instructions (Addendum)
Do not drive, operate heavy machinery, take a bath or swim until you have been evaluated and given further instructions by your neurologist. Please follow-up with [facility] as soon as possible. Please return to the emergency department for any new or worsening symptoms.  General instructions Contact your doctor each time you have a seizure. Avoid anything that gives you seizures. Keep a seizure diary. Write down: What you think caused each seizure. What you remember about each seizure. Keep all follow-up visits as told by your doctor. This is important. Contact a doctor if: You have another seizure. You have seizures more often. There is any change in what happens during your seizures. You continue to have seizures with treatment. You have symptoms of being sick or having an infection. Get help right away if: You have a seizure: That lasts longer than 5 minutes. That is different than seizures you had before. That makes it harder to breathe. After you hurt your head. After a seizure, you cannot speak or use a part of your body. After a seizure, you are confused or have a bad headache. You have two or more seizures in a row. You are having seizures more often. You do not wake up right after a seizure. You get hurt during a seizure.

## 2018-08-22 NOTE — Progress Notes (Deleted)
Patient: Summer Howard, Female    DOB: March 28, 1971, 47 y.o.   MRN: [id number] Visit Date: 08/22/2018  Today's Provider: Shirlee Latch, MD   No chief complaint on file.  Subjective:  I, Presley Raddle, CMA, am acting as a scribe for Shirlee Latch, MD.   New Patient: Summer Howard is a 47 year old female who presents today to Establish Care as a new patient.  -----------------------------------------------------------------   Review of Systems  Constitutional: Negative.   HENT: Negative.   Eyes: Negative.   Respiratory: Negative.   Cardiovascular: Negative.   Gastrointestinal: Negative.   Endocrine: Negative.   Genitourinary: Negative.   Musculoskeletal: Negative.   Skin: Negative.   Allergic/Immunologic: Negative.   Neurological: Negative.   Hematological: Negative.   Psychiatric/Behavioral: Negative.     Social History      She  reports that she has never smoked. She has never used smokeless tobacco. She reports that she does not drink alcohol or use drugs.       Social History   Socioeconomic History  . Marital status: Widowed    Spouse name: Not on file  . Number of children: Not on file  . Years of education: Not on file  . Highest education level: Not on file  Occupational History  . Not on file  Social Needs  . Financial resource strain: Not on file  . Food insecurity:    Worry: Not on file    Inability: Not on file  . Transportation needs:    Medical: Not on file    Non-medical: Not on file  Tobacco Use  . Smoking status: Never Smoker  . Smokeless tobacco: Never Used  Substance and Sexual Activity  . Alcohol use: No    Alcohol/week: 0.0 standard drinks  . Drug use: No  . Sexual activity: Never  Lifestyle  . Physical activity:    Days per week: Not on file    Minutes per session: Not on file  . Stress: Not on file  Relationships  . Social connections:    Talks on phone: Not on file    Gets together: Not on file    Attends  religious service: Not on file    Active member of club or organization: Not on file    Attends meetings of clubs or organizations: Not on file    Relationship status: Not on file  Other Topics Concern  . Not on file  Social History Narrative  . Not on file    Past Medical History:  Diagnosis Date  . Abscess of left lung with pneumonia (HCC)   . Anxiety   . CAP (community acquired pneumonia) 01/21/2016  . 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 Depression   . Gastric ulcer   . Iron deficiency anemia   . Last menstrual period (LMP) > 10 days ago    had uterine ablation, last cycle >5 years ago  . Lung abscess (HCC) 02/03/2016  . Malnutrition following gastrointestinal surgery 10/22/2014  . 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 Protein-calorie malnutrition (HCC)   . 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 Thrombocytopenia [facility])      Patient Active Problem List   Diagnosis Date Noted  . Menorrhagia 10/12/2016  . Abscess of left lung with pneumonia (HCC)   . Protein-calorie malnutrition, severe 02/15/2016  . Protein-calorie malnutrition (HCC)   . Necrotizing pneumonia (HCC) 02/06/2016  . Pleural effusion 02/06/2016  . Hemoptysis 02/06/2016  .  Iron deficiency anemia   . Lung abscess (HCC) 02/03/2016  . Chest pain with low risk for cardiac etiology 01/21/2016  . CAP (community acquired pneumonia) 01/21/2016  . Pneumonia 01/20/2016  . Pleuritic chest pain 01/20/2016  . Anemia following surgery 10/22/2014  . 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 Malnutrition following gastrointestinal surgery 10/22/2014  . 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 Gastroduodenal ulcer 07/31/2014  . Other specified postprocedural states 07/31/2014  . Anxiety, generalized 03/07/2014  . Small bowel obstruction (HCC) 04/02/2013  . Fatty infiltration of liver 02/25/2010    Past Surgical History:  Procedure  Laterality Date  . APPENDECTOMY    . CESAREAN SECTION    . CHOLECYSTECTOMY    . COLON SURGERY     resection of sigmoid volvulus  . GASTRIC BYPASS  2011  . uterine ablation    . VIDEO BRONCHOSCOPY N/A 02/08/2016   Procedure: VIDEO BRONCHOSCOPY WITH ENDOBRONCHIAL LAVAGE;  Surgeon: Purcell Nails, MD;  Location: [facility] OR;  Service: Thoracic;  Laterality: N/A;    Family History        Family Status  Relation Name Status  . Mother  Deceased  . Father  Alive  . Brother  Alive        Her family history includes Anxiety disorder in her brother; Breast cancer in her mother; Hypertension in her brother and father; Prostate cancer in her father.      Allergies  Allergen Reactions  . Phenobarbital Rash  . Sulfa Antibiotics Rash     Current Outpatient Medications:  .  acetaminophen (TYLENOL) 500 MG tablet, Take 500-1,000 mg by mouth every 6 (six) hours as needed for mild pain, fever or headache. , Disp: , Rfl:  .  busPIRone (BUSPAR) 10 MG tablet, Take 1 tablet (10 mg total) by mouth 2 (two) times daily. (Patient not taking: Reported on 06/08/2018), Disp: 60 tablet, Rfl: 1 .  FLUoxetine (PROZAC) 40 MG capsule, Take 1 capsule (40 mg total) by mouth every morning. (Patient not taking: Reported on 06/08/2018), Disp: 30 capsule, Rfl: 1 .  ibuprofen (ADVIL,MOTRIN) 200 MG tablet, Take 600 mg by mouth every 6 (six) hours as needed (for pain)., Disp: , Rfl:  .  levETIRAcetam (KEPPRA) 500 MG tablet, Take 1 tablet (500 mg total) by mouth 2 (two) times daily., Disp: 60 tablet, Rfl: 0 .  Multiple Vitamin (MULTIVITAMIN WITH MINERALS) TABS tablet, Take 1 tablet by mouth daily., Disp: , Rfl:  .  traZODone (DESYREL) 100 MG tablet, Take 1 tablet (100 mg total) by mouth at bedtime. (Patient not taking: Reported on 06/08/2018), Disp: 30 tablet, Rfl: 0   Patient Care Team: Rafael Bihari, MD as PCP - General (Internal Medicine)      Objective:   Vitals: There were no vitals taken for this visit.  There  were no vitals filed for this visit.   Physical Exam   Depression Screen No flowsheet data found.    Assessment & Plan:     Routine Health Maintenance and Physical Exam  Exercise Activities and Dietary recommendations Goals   None     There is no immunization history for the selected administration types on file for this patient.  Health Maintenance  Topic Date Due  . TETANUS/TDAP  10/10/1992  . PAP SMEAR  10/10/1994  . INFLUENZA VACCINE  07/13/2018  . HIV Screening  Completed     Discussed health benefits of physical activity, and encouraged her to engage in  regular exercise appropriate for her age and condition.    --------------------------------------------------------------------    Shirlee Latch, MD  [facility]

## 2018-08-23 ENCOUNTER — Ambulatory Visit: Payer: Self-pay | Admitting: Family Medicine

## 2021-06-08 ENCOUNTER — Encounter: Payer: Self-pay | Admitting: Internal Medicine
